# Patient Record
Sex: Female | Born: 1960 | Race: White | Hispanic: No | Marital: Single | Smoking: Former smoker
Health system: Southern US, Community
[De-identification: ages and names within clinical notes are randomized; demographics above are authoritative.]

## PROBLEM LIST (undated history)

## (undated) DIAGNOSIS — K5790 Diverticulosis of intestine, part unspecified, without perforation or abscess without bleeding: Secondary | ICD-10-CM

## (undated) DIAGNOSIS — E079 Disorder of thyroid, unspecified: Secondary | ICD-10-CM

## (undated) DIAGNOSIS — C539 Malignant neoplasm of cervix uteri, unspecified: Secondary | ICD-10-CM

## (undated) DIAGNOSIS — D126 Benign neoplasm of colon, unspecified: Secondary | ICD-10-CM

## (undated) DIAGNOSIS — J320 Chronic maxillary sinusitis: Secondary | ICD-10-CM

## (undated) DIAGNOSIS — K279 Peptic ulcer, site unspecified, unspecified as acute or chronic, without hemorrhage or perforation: Secondary | ICD-10-CM

## (undated) DIAGNOSIS — E890 Postprocedural hypothyroidism: Secondary | ICD-10-CM

## (undated) DIAGNOSIS — R55 Syncope and collapse: Secondary | ICD-10-CM

## (undated) DIAGNOSIS — M1990 Unspecified osteoarthritis, unspecified site: Secondary | ICD-10-CM

## (undated) DIAGNOSIS — R5381 Other malaise: Secondary | ICD-10-CM

## (undated) DIAGNOSIS — N200 Calculus of kidney: Secondary | ICD-10-CM

## (undated) DIAGNOSIS — T7840XA Allergy, unspecified, initial encounter: Secondary | ICD-10-CM

## (undated) DIAGNOSIS — Z972 Presence of dental prosthetic device (complete) (partial): Secondary | ICD-10-CM

## (undated) DIAGNOSIS — E785 Hyperlipidemia, unspecified: Secondary | ICD-10-CM

## (undated) DIAGNOSIS — I10 Essential (primary) hypertension: Secondary | ICD-10-CM

## (undated) DIAGNOSIS — Z8719 Personal history of other diseases of the digestive system: Secondary | ICD-10-CM

## (undated) DIAGNOSIS — M1711 Unilateral primary osteoarthritis, right knee: Secondary | ICD-10-CM

## (undated) DIAGNOSIS — J45909 Unspecified asthma, uncomplicated: Secondary | ICD-10-CM

## (undated) DIAGNOSIS — Z87891 Personal history of nicotine dependence: Secondary | ICD-10-CM

## (undated) DIAGNOSIS — J019 Acute sinusitis, unspecified: Secondary | ICD-10-CM

## (undated) DIAGNOSIS — K219 Gastro-esophageal reflux disease without esophagitis: Secondary | ICD-10-CM

## (undated) DIAGNOSIS — G4700 Insomnia, unspecified: Secondary | ICD-10-CM

## (undated) DIAGNOSIS — Z471 Aftercare following joint replacement surgery: Secondary | ICD-10-CM

## (undated) DIAGNOSIS — R413 Other amnesia: Secondary | ICD-10-CM

## (undated) DIAGNOSIS — J069 Acute upper respiratory infection, unspecified: Secondary | ICD-10-CM

## (undated) DIAGNOSIS — Z96651 Presence of right artificial knee joint: Secondary | ICD-10-CM

## (undated) DIAGNOSIS — J301 Allergic rhinitis due to pollen: Secondary | ICD-10-CM

## (undated) DIAGNOSIS — E039 Hypothyroidism, unspecified: Secondary | ICD-10-CM

## (undated) DIAGNOSIS — L930 Discoid lupus erythematosus: Secondary | ICD-10-CM

## (undated) DIAGNOSIS — Z79890 Hormone replacement therapy (postmenopausal): Secondary | ICD-10-CM

## (undated) DIAGNOSIS — R5383 Other fatigue: Secondary | ICD-10-CM

## (undated) HISTORY — PX: OOPHORECTOMY: SHX86

## (undated) HISTORY — DX: Hyperlipidemia, unspecified: E78.5

## (undated) HISTORY — DX: Malignant neoplasm of cervix uteri, unspecified: C53.9

## (undated) HISTORY — DX: Allergic rhinitis due to pollen: J30.1

## (undated) HISTORY — PX: ABDOMINAL HYSTERECTOMY: SHX81

## (undated) HISTORY — DX: Hormone replacement therapy: Z79.890

## (undated) HISTORY — DX: Benign neoplasm of colon, unspecified: D12.6

## (undated) HISTORY — PX: MOHS SURGERY: SUR867

## (undated) HISTORY — DX: Postprocedural hypothyroidism: E89.0

## (undated) HISTORY — DX: Calculus of kidney: N20.0

## (undated) HISTORY — DX: Other fatigue: R53.83

## (undated) HISTORY — PX: THYROIDECTOMY: SHX17

## (undated) HISTORY — PX: TUBAL LIGATION: SHX77

## (undated) HISTORY — DX: Disorder of thyroid, unspecified: E07.9

## (undated) HISTORY — DX: Unspecified asthma, uncomplicated: J45.909

## (undated) HISTORY — DX: Other malaise: R53.81

## (undated) HISTORY — DX: Diverticulosis of intestine, part unspecified, without perforation or abscess without bleeding: K57.90

## (undated) HISTORY — DX: Insomnia, unspecified: G47.00

## (undated) HISTORY — DX: Aftercare following joint replacement surgery: Z47.1

## (undated) HISTORY — DX: Peptic ulcer, site unspecified, unspecified as acute or chronic, without hemorrhage or perforation: K27.9

## (undated) HISTORY — DX: Chronic maxillary sinusitis: J32.0

## (undated) HISTORY — DX: Other amnesia: R41.3

## (undated) HISTORY — DX: Acute upper respiratory infection, unspecified: J06.9

## (undated) HISTORY — PX: REPLACEMENT TOTAL KNEE: SUR1224

## (undated) HISTORY — DX: Gastro-esophageal reflux disease without esophagitis: K21.9

## (undated) HISTORY — PX: OTHER SURGICAL HISTORY: SHX169

## (undated) HISTORY — DX: Personal history of nicotine dependence: Z87.891

## (undated) HISTORY — DX: Allergy, unspecified, initial encounter: T78.40XA

## (undated) HISTORY — DX: Unilateral primary osteoarthritis, right knee: M17.11

## (undated) HISTORY — PX: COLONOSCOPY: SHX174

## (undated) HISTORY — PX: ELBOW SURGERY: SHX618

## (undated) HISTORY — DX: Hypothyroidism, unspecified: E03.9

## (undated) HISTORY — PX: CHOLECYSTECTOMY: SHX55

## (undated) HISTORY — PX: RECTOCELE REPAIR: SHX761

## (undated) HISTORY — DX: Essential (primary) hypertension: I10

## (undated) HISTORY — DX: Acute sinusitis, unspecified: J01.90

## (undated) HISTORY — DX: Presence of right artificial knee joint: Z96.651

---

## 1997-03-17 ENCOUNTER — Ambulatory Visit (HOSPITAL_COMMUNITY): Admission: RE | Admit: 1997-03-17 | Discharge: 1997-03-17 | Payer: Self-pay | Admitting: *Deleted

## 1997-03-23 ENCOUNTER — Ambulatory Visit (HOSPITAL_COMMUNITY): Admission: RE | Admit: 1997-03-23 | Discharge: 1997-03-23 | Payer: Self-pay | Admitting: *Deleted

## 1998-04-23 ENCOUNTER — Ambulatory Visit (HOSPITAL_COMMUNITY): Admission: RE | Admit: 1998-04-23 | Discharge: 1998-04-23 | Payer: Self-pay | Admitting: *Deleted

## 1998-04-26 ENCOUNTER — Other Ambulatory Visit: Admission: RE | Admit: 1998-04-26 | Discharge: 1998-04-26 | Payer: Self-pay | Admitting: *Deleted

## 1999-04-26 ENCOUNTER — Encounter: Payer: Self-pay | Admitting: *Deleted

## 1999-04-26 ENCOUNTER — Encounter: Admission: RE | Admit: 1999-04-26 | Discharge: 1999-04-26 | Payer: Self-pay | Admitting: *Deleted

## 1999-06-06 ENCOUNTER — Other Ambulatory Visit: Admission: RE | Admit: 1999-06-06 | Discharge: 1999-06-06 | Payer: Self-pay | Admitting: *Deleted

## 2000-03-31 ENCOUNTER — Encounter: Payer: Self-pay | Admitting: *Deleted

## 2000-03-31 ENCOUNTER — Encounter: Admission: RE | Admit: 2000-03-31 | Discharge: 2000-03-31 | Payer: Self-pay | Admitting: *Deleted

## 2000-08-10 ENCOUNTER — Other Ambulatory Visit: Admission: RE | Admit: 2000-08-10 | Discharge: 2000-08-10 | Payer: Self-pay | Admitting: *Deleted

## 2001-04-16 ENCOUNTER — Encounter: Payer: Self-pay | Admitting: *Deleted

## 2001-04-16 ENCOUNTER — Encounter: Admission: RE | Admit: 2001-04-16 | Discharge: 2001-04-16 | Payer: Self-pay | Admitting: *Deleted

## 2001-07-20 ENCOUNTER — Other Ambulatory Visit: Admission: RE | Admit: 2001-07-20 | Discharge: 2001-07-20 | Payer: Self-pay | Admitting: *Deleted

## 2002-05-11 ENCOUNTER — Encounter: Admission: RE | Admit: 2002-05-11 | Discharge: 2002-05-11 | Payer: Self-pay | Admitting: *Deleted

## 2002-05-11 ENCOUNTER — Encounter: Payer: Self-pay | Admitting: *Deleted

## 2002-08-09 ENCOUNTER — Other Ambulatory Visit: Admission: RE | Admit: 2002-08-09 | Discharge: 2002-08-09 | Payer: Self-pay | Admitting: *Deleted

## 2002-09-27 ENCOUNTER — Encounter: Admission: RE | Admit: 2002-09-27 | Discharge: 2002-09-27 | Payer: Self-pay | Admitting: Orthopedic Surgery

## 2002-09-27 ENCOUNTER — Encounter: Payer: Self-pay | Admitting: Orthopedic Surgery

## 2003-05-12 ENCOUNTER — Encounter: Admission: RE | Admit: 2003-05-12 | Discharge: 2003-05-12 | Payer: Self-pay | Admitting: *Deleted

## 2003-08-09 ENCOUNTER — Other Ambulatory Visit: Admission: RE | Admit: 2003-08-09 | Discharge: 2003-08-09 | Payer: Self-pay | Admitting: *Deleted

## 2003-09-27 ENCOUNTER — Other Ambulatory Visit: Admission: RE | Admit: 2003-09-27 | Discharge: 2003-09-27 | Payer: Self-pay | Admitting: *Deleted

## 2003-10-06 ENCOUNTER — Encounter: Admission: RE | Admit: 2003-10-06 | Discharge: 2003-10-06 | Payer: Self-pay | Admitting: *Deleted

## 2004-04-16 ENCOUNTER — Encounter: Admission: RE | Admit: 2004-04-16 | Discharge: 2004-04-16 | Payer: Self-pay | Admitting: *Deleted

## 2004-08-20 ENCOUNTER — Other Ambulatory Visit: Admission: RE | Admit: 2004-08-20 | Discharge: 2004-08-20 | Payer: Self-pay | Admitting: *Deleted

## 2004-11-26 ENCOUNTER — Encounter: Admission: RE | Admit: 2004-11-26 | Discharge: 2004-11-26 | Payer: Self-pay | Admitting: *Deleted

## 2005-06-12 ENCOUNTER — Encounter: Admission: RE | Admit: 2005-06-12 | Discharge: 2005-06-12 | Payer: Self-pay | Admitting: Family Medicine

## 2005-08-28 ENCOUNTER — Other Ambulatory Visit: Admission: RE | Admit: 2005-08-28 | Discharge: 2005-08-28 | Payer: Self-pay | Admitting: *Deleted

## 2005-12-25 ENCOUNTER — Encounter: Admission: RE | Admit: 2005-12-25 | Discharge: 2005-12-25 | Payer: Self-pay | Admitting: *Deleted

## 2006-01-12 ENCOUNTER — Encounter: Payer: Self-pay | Admitting: Vascular Surgery

## 2006-01-12 ENCOUNTER — Ambulatory Visit (HOSPITAL_COMMUNITY): Admission: RE | Admit: 2006-01-12 | Discharge: 2006-01-12 | Payer: Self-pay | Admitting: Orthopedic Surgery

## 2006-03-25 ENCOUNTER — Ambulatory Visit: Payer: Self-pay | Admitting: Family Medicine

## 2006-06-25 ENCOUNTER — Telehealth (INDEPENDENT_AMBULATORY_CARE_PROVIDER_SITE_OTHER): Payer: Self-pay | Admitting: *Deleted

## 2006-06-26 ENCOUNTER — Ambulatory Visit: Payer: Self-pay | Admitting: Internal Medicine

## 2006-06-26 DIAGNOSIS — G4700 Insomnia, unspecified: Secondary | ICD-10-CM

## 2006-06-26 DIAGNOSIS — E039 Hypothyroidism, unspecified: Secondary | ICD-10-CM | POA: Insufficient documentation

## 2006-06-26 DIAGNOSIS — K219 Gastro-esophageal reflux disease without esophagitis: Secondary | ICD-10-CM

## 2006-06-26 DIAGNOSIS — J301 Allergic rhinitis due to pollen: Secondary | ICD-10-CM | POA: Insufficient documentation

## 2006-06-26 DIAGNOSIS — J320 Chronic maxillary sinusitis: Secondary | ICD-10-CM

## 2006-06-26 HISTORY — DX: Allergic rhinitis due to pollen: J30.1

## 2006-06-26 HISTORY — DX: Chronic maxillary sinusitis: J32.0

## 2006-06-26 HISTORY — DX: Insomnia, unspecified: G47.00

## 2006-06-26 HISTORY — DX: Hypothyroidism, unspecified: E03.9

## 2006-06-26 HISTORY — DX: Gastro-esophageal reflux disease without esophagitis: K21.9

## 2006-09-15 ENCOUNTER — Other Ambulatory Visit: Admission: RE | Admit: 2006-09-15 | Discharge: 2006-09-15 | Payer: Self-pay | Admitting: *Deleted

## 2006-12-28 ENCOUNTER — Encounter: Admission: RE | Admit: 2006-12-28 | Discharge: 2006-12-28 | Payer: Self-pay | Admitting: *Deleted

## 2006-12-29 ENCOUNTER — Ambulatory Visit: Payer: Self-pay | Admitting: Internal Medicine

## 2006-12-29 ENCOUNTER — Telehealth (INDEPENDENT_AMBULATORY_CARE_PROVIDER_SITE_OTHER): Payer: Self-pay | Admitting: *Deleted

## 2006-12-29 DIAGNOSIS — J019 Acute sinusitis, unspecified: Secondary | ICD-10-CM

## 2006-12-29 HISTORY — DX: Acute sinusitis, unspecified: J01.90

## 2007-02-16 ENCOUNTER — Telehealth (INDEPENDENT_AMBULATORY_CARE_PROVIDER_SITE_OTHER): Payer: Self-pay | Admitting: Family Medicine

## 2007-02-19 ENCOUNTER — Encounter (INDEPENDENT_AMBULATORY_CARE_PROVIDER_SITE_OTHER): Payer: Self-pay | Admitting: Family Medicine

## 2007-03-15 ENCOUNTER — Encounter (INDEPENDENT_AMBULATORY_CARE_PROVIDER_SITE_OTHER): Payer: Self-pay | Admitting: Family Medicine

## 2007-03-29 ENCOUNTER — Telehealth (INDEPENDENT_AMBULATORY_CARE_PROVIDER_SITE_OTHER): Payer: Self-pay | Admitting: *Deleted

## 2007-08-23 ENCOUNTER — Encounter (INDEPENDENT_AMBULATORY_CARE_PROVIDER_SITE_OTHER): Payer: Self-pay | Admitting: *Deleted

## 2007-08-23 ENCOUNTER — Telehealth: Payer: Self-pay | Admitting: Family Medicine

## 2007-08-23 ENCOUNTER — Ambulatory Visit: Payer: Self-pay | Admitting: Family Medicine

## 2007-08-23 DIAGNOSIS — J069 Acute upper respiratory infection, unspecified: Secondary | ICD-10-CM | POA: Insufficient documentation

## 2007-08-23 DIAGNOSIS — R5381 Other malaise: Secondary | ICD-10-CM | POA: Insufficient documentation

## 2007-08-23 DIAGNOSIS — R5383 Other fatigue: Secondary | ICD-10-CM

## 2007-08-23 HISTORY — DX: Acute upper respiratory infection, unspecified: J06.9

## 2007-08-23 HISTORY — DX: Other malaise: R53.81

## 2007-08-24 ENCOUNTER — Encounter (INDEPENDENT_AMBULATORY_CARE_PROVIDER_SITE_OTHER): Payer: Self-pay | Admitting: *Deleted

## 2007-09-01 ENCOUNTER — Encounter (INDEPENDENT_AMBULATORY_CARE_PROVIDER_SITE_OTHER): Payer: Self-pay | Admitting: *Deleted

## 2007-09-01 LAB — CONVERTED CEMR LAB
BUN: 7 mg/dL (ref 6–23)
Basophils Absolute: 0 10*3/uL (ref 0.0–0.1)
Basophils Relative: 0.4 % (ref 0.0–3.0)
Calcium: 9.4 mg/dL (ref 8.4–10.5)
Creatinine, Ser: 0.7 mg/dL (ref 0.4–1.2)
Eosinophils Absolute: 0.1 10*3/uL (ref 0.0–0.7)
GFR calc Af Amer: 115 mL/min
GFR calc non Af Amer: 95 mL/min
Glucose, Bld: 82 mg/dL (ref 70–99)
Hemoglobin: 14.9 g/dL (ref 12.0–15.0)
Lymphocytes Relative: 30.3 % (ref 12.0–46.0)
MCHC: 35.3 g/dL (ref 30.0–36.0)
MCV: 93 fL (ref 78.0–100.0)
Monocytes Absolute: 0.7 10*3/uL (ref 0.1–1.0)
Neutro Abs: 4.1 10*3/uL (ref 1.4–7.7)
RBC: 4.55 M/uL (ref 3.87–5.11)
RDW: 12.4 % (ref 11.5–14.6)
Vitamin B-12: 1035 pg/mL — ABNORMAL HIGH (ref 211–911)

## 2008-01-20 ENCOUNTER — Encounter: Admission: RE | Admit: 2008-01-20 | Discharge: 2008-01-20 | Payer: Self-pay | Admitting: Obstetrics and Gynecology

## 2008-01-25 ENCOUNTER — Ambulatory Visit: Payer: Self-pay | Admitting: Family Medicine

## 2008-02-01 ENCOUNTER — Telehealth: Payer: Self-pay | Admitting: Family Medicine

## 2010-02-10 ENCOUNTER — Encounter: Payer: Self-pay | Admitting: Obstetrics and Gynecology

## 2011-02-21 HISTORY — PX: ESOPHAGOGASTRODUODENOSCOPY: SHX1529

## 2011-04-12 ENCOUNTER — Emergency Department (HOSPITAL_COMMUNITY)
Admission: EM | Admit: 2011-04-12 | Discharge: 2011-04-12 | Discharge status: Against Medical Advice | Payer: Self-pay | Attending: Emergency Medicine | Admitting: Emergency Medicine

## 2011-04-12 NOTE — ED Notes (Signed)
[id number]<AV> Expected date:<BR> Expected time: 5:40 PM<BR> Means of arrival:Ambulance<BR> Comments:<BR> M12 -- Nausea

## 2011-04-12 NOTE — ED Notes (Signed)
Upon nurse's entrance into the room, pt was adamant on leaving.  Pt would not allow nurse to assess her.  IV was removed.  AMA papers signed.  Pt left.

## 2011-04-21 MED FILL — Ondansetron HCl Inj 4 MG/2ML (2 MG/ML): INTRAMUSCULAR | Qty: 2 | Status: AC

## 2012-05-23 ENCOUNTER — Emergency Department (HOSPITAL_COMMUNITY)
Admission: EM | Admit: 2012-05-23 | Discharge: 2012-05-23 | Disposition: A | Discharge status: Home / Self Care | Payer: No Typology Code available for payment source | Attending: Emergency Medicine | Admitting: Emergency Medicine

## 2012-05-23 ENCOUNTER — Encounter (HOSPITAL_COMMUNITY): Payer: Self-pay | Admitting: *Deleted

## 2012-05-23 ENCOUNTER — Emergency Department (HOSPITAL_COMMUNITY): Payer: No Typology Code available for payment source

## 2012-05-23 DIAGNOSIS — Y9389 Activity, other specified: Secondary | ICD-10-CM | POA: Insufficient documentation

## 2012-05-23 DIAGNOSIS — S6990XA Unspecified injury of unspecified wrist, hand and finger(s), initial encounter: Secondary | ICD-10-CM | POA: Insufficient documentation

## 2012-05-23 DIAGNOSIS — S8010XA Contusion of unspecified lower leg, initial encounter: Secondary | ICD-10-CM | POA: Insufficient documentation

## 2012-05-23 DIAGNOSIS — IMO0002 Reserved for concepts with insufficient information to code with codable children: Secondary | ICD-10-CM | POA: Insufficient documentation

## 2012-05-23 DIAGNOSIS — S8012XA Contusion of left lower leg, initial encounter: Secondary | ICD-10-CM

## 2012-05-23 DIAGNOSIS — K219 Gastro-esophageal reflux disease without esophagitis: Secondary | ICD-10-CM | POA: Insufficient documentation

## 2012-05-23 DIAGNOSIS — M79632 Pain in left forearm: Secondary | ICD-10-CM

## 2012-05-23 DIAGNOSIS — Y92410 Unspecified street and highway as the place of occurrence of the external cause: Secondary | ICD-10-CM | POA: Insufficient documentation

## 2012-05-23 DIAGNOSIS — S59909A Unspecified injury of unspecified elbow, initial encounter: Secondary | ICD-10-CM | POA: Insufficient documentation

## 2012-05-23 DIAGNOSIS — Z8739 Personal history of other diseases of the musculoskeletal system and connective tissue: Secondary | ICD-10-CM | POA: Insufficient documentation

## 2012-05-23 HISTORY — DX: Gastro-esophageal reflux disease without esophagitis: K21.9

## 2012-05-23 HISTORY — DX: Discoid lupus erythematosus: L93.0

## 2012-05-23 MED ORDER — OXYCODONE-ACETAMINOPHEN 5-325 MG PO TABS: 1.0000 | ORAL_TABLET | ORAL | Status: DC | PRN

## 2012-05-23 NOTE — ED Provider Notes (Signed)
Medical screening examination/treatment/procedure(s) were performed by non-physician practitioner and as supervising physician I was immediately available for consultation/collaboration.   Jozlin Bently H Strother Everitt, MD 05/23/12 2327 
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
## 2012-06-15 ENCOUNTER — Emergency Department (HOSPITAL_BASED_OUTPATIENT_CLINIC_OR_DEPARTMENT_OTHER)
Admission: EM | Admit: 2012-06-15 | Discharge: 2012-06-15 | Disposition: A | Discharge status: Home / Self Care | Payer: No Typology Code available for payment source | Attending: Emergency Medicine | Admitting: Emergency Medicine

## 2012-06-15 ENCOUNTER — Emergency Department (HOSPITAL_BASED_OUTPATIENT_CLINIC_OR_DEPARTMENT_OTHER): Payer: No Typology Code available for payment source

## 2012-06-15 ENCOUNTER — Encounter (HOSPITAL_BASED_OUTPATIENT_CLINIC_OR_DEPARTMENT_OTHER): Payer: Self-pay | Admitting: *Deleted

## 2012-06-15 DIAGNOSIS — Y929 Unspecified place or not applicable: Secondary | ICD-10-CM | POA: Insufficient documentation

## 2012-06-15 DIAGNOSIS — Z87891 Personal history of nicotine dependence: Secondary | ICD-10-CM | POA: Insufficient documentation

## 2012-06-15 DIAGNOSIS — K219 Gastro-esophageal reflux disease without esophagitis: Secondary | ICD-10-CM | POA: Insufficient documentation

## 2012-06-15 DIAGNOSIS — S139XXA Sprain of joints and ligaments of unspecified parts of neck, initial encounter: Secondary | ICD-10-CM | POA: Insufficient documentation

## 2012-06-15 DIAGNOSIS — Z791 Long term (current) use of non-steroidal anti-inflammatories (NSAID): Secondary | ICD-10-CM | POA: Insufficient documentation

## 2012-06-15 DIAGNOSIS — L930 Discoid lupus erythematosus: Secondary | ICD-10-CM | POA: Insufficient documentation

## 2012-06-15 DIAGNOSIS — Z79899 Other long term (current) drug therapy: Secondary | ICD-10-CM | POA: Insufficient documentation

## 2012-06-15 DIAGNOSIS — Y9389 Activity, other specified: Secondary | ICD-10-CM | POA: Insufficient documentation

## 2012-06-15 DIAGNOSIS — S161XXD Strain of muscle, fascia and tendon at neck level, subsequent encounter: Secondary | ICD-10-CM

## 2012-06-15 MED ORDER — METHOCARBAMOL 500 MG PO TABS: 500.0000 mg | ORAL_TABLET | Freq: Two times a day (BID) | ORAL | Status: DC

## 2012-06-15 MED ORDER — KETOROLAC TROMETHAMINE 60 MG/2ML IM SOLN
60.0000 mg | Freq: Once | INTRAMUSCULAR | Status: AC
  Administered 2012-06-15: 60 mg via INTRAMUSCULAR
  Filled 2012-06-15: qty 2

## 2012-06-15 MED ORDER — NAPROXEN 500 MG PO TABS: 500.0000 mg | ORAL_TABLET | Freq: Two times a day (BID) | ORAL | Status: DC
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
## 2012-06-15 NOTE — ED Notes (Signed)
Encouraged Pt. To call her gastro. Dr. Before she gets scripts filled.
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
## 2012-11-29 ENCOUNTER — Encounter: Payer: Self-pay | Admitting: Internal Medicine

## 2012-11-29 DIAGNOSIS — E079 Disorder of thyroid, unspecified: Secondary | ICD-10-CM | POA: Insufficient documentation

## 2012-11-29 DIAGNOSIS — E785 Hyperlipidemia, unspecified: Secondary | ICD-10-CM

## 2012-11-29 DIAGNOSIS — L930 Discoid lupus erythematosus: Secondary | ICD-10-CM | POA: Insufficient documentation

## 2012-11-29 DIAGNOSIS — K219 Gastro-esophageal reflux disease without esophagitis: Secondary | ICD-10-CM | POA: Insufficient documentation

## 2012-11-30 ENCOUNTER — Encounter: Payer: Self-pay | Admitting: Emergency Medicine

## 2012-11-30 ENCOUNTER — Ambulatory Visit: Payer: 59 | Admitting: Emergency Medicine

## 2012-11-30 VITALS — BP 112/80 | HR 80 | Temp 98.0°F | Resp 18 | Ht 61.0 in | Wt 140.0 lb

## 2012-11-30 DIAGNOSIS — M79642 Pain in left hand: Secondary | ICD-10-CM

## 2012-11-30 DIAGNOSIS — R5381 Other malaise: Secondary | ICD-10-CM

## 2012-11-30 LAB — CBC WITH DIFFERENTIAL/PLATELET
Basophils Absolute: 0 10*3/uL (ref 0.0–0.1)
Eosinophils Absolute: 0.1 10*3/uL (ref 0.0–0.7)
Eosinophils Relative: 1 % (ref 0–5)
Lymphocytes Relative: 29 % (ref 12–46)
Lymphs Abs: 2.2 10*3/uL (ref 0.7–4.0)
MCH: 32 pg (ref 26.0–34.0)
MCV: 91.8 fL (ref 78.0–100.0)
Monocytes Absolute: 0.5 10*3/uL (ref 0.1–1.0)
Neutro Abs: 4.8 10*3/uL (ref 1.7–7.7)
Neutrophils Relative %: 63 % (ref 43–77)
Platelets: 358 10*3/uL (ref 150–400)
RBC: 4.41 MIL/uL (ref 3.87–5.11)
RDW: 15.3 % (ref 11.5–15.5)
WBC: 7.6 10*3/uL (ref 4.0–10.5)

## 2012-11-30 MED ORDER — CELECOXIB 100 MG PO CAPS: 100.0000 mg | ORAL_CAPSULE | Freq: Every day | ORAL | Status: DC

## 2012-11-30 NOTE — Patient Instructions (Signed)
Lupus Lupus (also called systemic lupus erythematosus, SLE) is a disorder of the body's natural defense system (immune system). In lupus, the immune system attacks various areas of the body (autoimmune disease). CAUSES The cause is unknown. However, lupus runs in families. Certain genes can make you more likely to develop lupus. It is 10 times more common in women than in men. Lupus is also more common in African Americans and Asians. Other factors also play a role, such as viruses (Epstein-Barr virus, EBV), stress, hormones, cigarette smoke, and certain drugs. SYMPTOMS Lupus can affect many parts of the body, including the joints, skin, kidneys, lungs, heart, nervous system, and blood vessels. The signs and symptoms of lupus differ from person to person. The disease can range from mild to life-threatening. Typical features of lupus include:  Butterfly-shaped rash over the face.  Arthritis involving one or more joints.  Kidney disease.  Fever, weight loss, hair loss, fatigue.  Poor circulation in the fingers and toes (Raynaud's disease).  Chest pain when taking deep breaths. Abdominal pain may also occur.  Skin rash in areas exposed to the sun.  Sores in the mouth and nose. DIAGNOSIS Diagnosing lupus can take a long time and is often difficult. An exam and an accurate account of your symptoms and health problems is very important. Blood tests are necessary, though no single test can confirm or rule out lupus. Most people with lupus test positive for antinuclear antibodies (ANA) on a blood test. Additional blood tests, a urine test (urinalysis), and sometimes a kidney or skin tissue sample (biopsy) can help to confirm or rule out lupus. TREATMENT There is no cure for lupus. Your caregiver will develop a treatment plan based on your age, sex, health, symptoms, and lifestyle. The goals are to prevent flares, to treat them when they do occur, and to minimize organ damage and complications. How  the disease may affect each person varies widely. Most people with lupus can live normal lives, but this disorder must be carefully monitored. Treatment must be adjusted as necessary to prevent serious complications. Medicines used for treatment:  Nonsteroidal anti-inflammatory drugs (NSAIDs) decrease inflammation and can help with chest pain, joint pain, and fevers. Examples include ibuprofen and naproxen.  Antimalarial drugs were designed to treat malaria. They also treat fatigue, joint pain, skin rashes, and inflammation of the lungs in patients with lupus.  Corticosteroids are powerful hormones that rapidly suppress inflammation. The lowest dose with the highest benefit will be chosen. They can be given by cream, pills, injections, and through the vein (intravenously).  Immunosuppressive drugs block the making of immune cells. They may be used for kidney or nerve disease. HOME CARE INSTRUCTIONS  Exercise. Low-impact activities can usually help keep joints flexible without being too strenuous.  Rest after periods of exercise.  Avoid excessive sun exposure.  Follow proper nutrition and take supplements as recommended by your caregiver.  Stress management can be helpful. SEEK MEDICAL CARE IF:  You have increased fatigue.  You develop pain.  You develop a rash.  You have an oral temperature above 102 F (38.9 C).  You develop abdominal discomfort.  You develop a headache.  You experience dizziness. FOR MORE INFORMATION National Institute of Neurological Disorders and Stroke: [employer] of Rheumatology: www.rheumatology.[place] of Arthritis and Musculoskeletal and Skin Diseases: www.niams.http://www.myers.net/ Document Released: 12/27/2001 Document Revised: 03/31/2011 Document Reviewed: 04/19/2009 [facility] Patient Information 2014 [place], [place]. Hypoglycemia (Low Blood Sugar) Hypoglycemia is when the glucose (sugar) in your blood is  too low.  Hypoglycemia can happen for many reasons. It can happen to people with or without diabetes. Hypoglycemia can develop quickly and can be a medical emergency.  CAUSES  Having hypoglycemia does not mean that you will develop diabetes. Different causes include:  Missed or delayed meals or not enough carbohydrates eaten.  Medication overdose. This could be by accident or deliberate. If by accident, your medication may need to be adjusted or changed.  Exercise or increased activity without adjustments in carbohydrates or medications.  A nerve disorder that affects body functions like your heart rate, blood pressure and digestion (autonomic neuropathy).  A condition where the stomach muscles do not function properly (gastroparesis). Therefore, medications may not absorb properly.  The inability to recognize the signs of hypoglycemia (hypoglycemic unawareness).  Absorption of insulin  may be altered.  Alcohol consumption.  Pregnancy/menstrual cycles/postpartum. This may be due to hormones.  Certain kinds of tumors. This is very rare. SYMPTOMS   Sweating.  Hunger.  Dizziness.  Blurred vision.  Drowsiness.  Weakness.  Headache.  Rapid heart beat.  Shakiness.  Nervousness. DIAGNOSIS  Diagnosis is made by monitoring blood glucose in one or all of the following ways:  Fingerstick blood glucose monitoring.  Laboratory results. TREATMENT  If you think your blood glucose is low:  Check your blood glucose, if possible. If it is less than 70 mg/dl, take one of the following:  3-4 glucose tablets.   cup juice (prefer clear like apple).   cup "regular" soda pop.  1 cup milk.  -1 tube of glucose gel.  5-6 hard candies.  Do not over treat because your blood glucose (sugar) will only go too high.  Wait 15 minutes and recheck your blood glucose. If it is still less than 70 mg/dl (or below your target range), repeat treatment.  Eat a snack if it is more than one hour  until your next meal. Sometimes, your blood glucose may go so low that you are unable to treat yourself. You may need someone to help you. You may even pass out or be unable to swallow. This may require you to get an injection of glucagon, which raises the blood glucose. HOME CARE INSTRUCTIONS  Check blood glucose as recommended by your caregiver.  Take medication as prescribed by your caregiver.  Follow your meal plan. Do not skip meals. Eat on time.  If you are going to drink alcohol, drink it only with meals.  Check your blood glucose before driving.  Check your blood glucose before and after exercise. If you exercise longer or different than usual, be sure to check blood glucose more frequently.  Always carry treatment with you. Glucose tablets are the easiest to carry.  Always wear medical alert jewelry or carry some form of identification that states that you have diabetes. This will alert people that you have diabetes. If you have hypoglycemia, they will have a better idea on what to do. SEEK MEDICAL CARE IF:   You are having problems keeping your blood sugar at target range.  You are having frequent episodes of hypoglycemia.  You feel you might be having side effects from your medicines.  You have symptoms of an illness that is not improving after 3-4 days.  You notice a change in vision or a new problem with your vision. SEEK IMMEDIATE MEDICAL CARE IF:   You are a family member or friend of a person whose blood glucose goes below 70 mg/dl and is accompanied by:  Confusion.  A change in mental status.  The inability to swallow.  Passing out. Document Released: 01/06/2005 Document Revised: 03/31/2011 Document Reviewed: 05/05/2011 [facility] Patient Information 2014 [place], [place]. Fatigue Fatigue is a feeling of tiredness, lack of energy, lack of motivation, or feeling tired all the time. Having enough rest, good nutrition, and reducing stress will normally reduce  fatigue. Consult your caregiver if it persists. The nature of your fatigue will help your caregiver to find out its cause. The treatment is based on the cause.  CAUSES  There are many causes for fatigue. Most of the time, fatigue can be traced to one or more of your habits or routines. Most causes fit into one or more of three general areas. They are: Lifestyle problems  Sleep disturbances.  Overwork.  Physical exertion.  Unhealthy habits.  Poor eating habits or eating disorders.  Alcohol and/or drug use .  Lack of proper nutrition (malnutrition). Psychological problems  Stress and/or anxiety problems.  Depression.  Grief.  Boredom. Medical Problems or Conditions  Anemia.  Pregnancy.  Thyroid gland problems.  Recovery from major surgery.  Continuous pain.  Emphysema or asthma that is not well controlled  Allergic conditions.  Diabetes.  Infections (such as mononucleosis).  Obesity.  Sleep disorders, such as sleep apnea.  Heart failure or other heart-related problems.  Cancer.  Kidney disease.  Liver disease.  Effects of certain medicines such as antihistamines, cough and cold remedies, prescription pain medicines, heart and blood pressure medicines, drugs used for treatment of cancer, and some antidepressants. SYMPTOMS  The symptoms of fatigue include:   Lack of energy.  Lack of drive (motivation).  Drowsiness.  Feeling of indifference to the surroundings. DIAGNOSIS  The details of how you feel help guide your caregiver in finding out what is causing the fatigue. You will be asked about your present and past health condition. It is important to review all medicines that you take, including prescription and non-prescription items. A thorough exam will be done. You will be questioned about your feelings, habits, and normal lifestyle. Your caregiver may suggest blood tests, urine tests, or other tests to look for common medical causes of fatigue.   TREATMENT  Fatigue is treated by correcting the underlying cause. For example, if you have continuous pain or depression, treating these causes will improve how you feel. Similarly, adjusting the dose of certain medicines will help in reducing fatigue.  HOME CARE INSTRUCTIONS   Try to get the required amount of good sleep every night.  Eat a healthy and nutritious diet, and drink enough water throughout the day.  Practice ways of relaxing (including yoga or meditation).  Exercise regularly.  Make plans to change situations that cause stress. Act on those plans so that stresses decrease over time. Keep your work and personal routine reasonable.  Avoid street drugs and minimize use of alcohol.  Start taking a daily multivitamin after consulting your caregiver. SEEK MEDICAL CARE IF:   You have persistent tiredness, which cannot be accounted for.  You have fever.  You have unintentional weight loss.  You have headaches.  You have disturbed sleep throughout the night.  You are feeling sad.  You have constipation.  You have dry skin.  You have gained weight.  You are taking any new or different medicines that you suspect are causing fatigue.  You are unable to sleep at night.  You develop any unusual swelling of your legs or other parts of your body. SEEK IMMEDIATE MEDICAL CARE  IF:   You are feeling confused.  Your vision is blurred.  You feel faint or pass out.  You develop severe headache.  You develop severe abdominal, pelvic, or back pain.  You develop chest pain, shortness of breath, or an irregular or fast heartbeat.  You are unable to pass a normal amount of urine.  You develop abnormal bleeding such as bleeding from the rectum or you vomit blood.  You have thoughts about harming yourself or committing suicide.  You are worried that you might harm someone else. MAKE SURE YOU:   Understand these instructions.  Will watch your condition.  Will get  help right away if you are not doing well or get worse. Document Released: 11/03/2006 Document Revised: 03/31/2011 Document Reviewed: 11/03/2006 [facility] Patient Information 2014 [place], [place].

## 2012-11-30 NOTE — Progress Notes (Signed)
Subjective:    Patient ID: Jasmine Hahn, female    DOB: 08/21/1960, 52 y.o.   MRN: [id number]  HPI Comments: 52 yo female with multiple complaints of most importance is weakness. Patient notes feel tired but admits to skipping meals, not eating healthy or exercising regularly. She also notes her left hip and wrist have been hurting with occasional swelling with out trauma HX.  She notes Celebrex has helped in the past for similar pain issues, she denies Rheum workup but has + lupus diagnosis from derm. She has been having more ear fullness with occasional pain x several weeks without relief with sudafed.  Loss of Consciousness Associated symptoms include light-headedness.    Current Outpatient Prescriptions on File Prior to Visit  Medication Sig Dispense Refill  . BIOTIN PO Take 1 tablet by mouth daily.      . cetirizine (ZYRTEC) 10 MG tablet Take 10 mg by mouth daily.      . Cyanocobalamin (VITAMIN B-12 PO) Take 1 tablet by mouth daily.      Marland Kitchen levothyroxine (SYNTHROID, LEVOTHROID) 112 MCG tablet Take 112 mcg by mouth daily before breakfast.      . Omega-3 Fatty Acids (FISH OIL PO) Take 1 capsule by mouth daily.      Marland Kitchen omeprazole (PRILOSEC) 40 MG capsule Take 40 mg by mouth daily.      . phentermine 37.5 MG capsule Take 18.75 mg by mouth every morning. Takes 1/2 tab daily=18.75mg        No current facility-administered medications on file prior to visit.    Review of patient's allergies indicates no known allergies.  Past Medical History  Diagnosis Date  . GERD (gastroesophageal reflux disease)   . Discoid lupus   . Thyroid disease     hypo  . Hyperlipidemia      Review of Systems  Constitutional: Positive for fatigue.  HENT: Positive for ear pain and postnasal drip.   Eyes: Negative.   Respiratory: Negative.   Cardiovascular: Positive for syncope.  Gastrointestinal: Negative.   Neurological: Positive for light-headedness.  Hematological: Negative.    Psychiatric/Behavioral: Negative.   All other systems reviewed and are negative.       Objective:   Physical Exam  Nursing note and vitals reviewed. Constitutional: She is oriented to person, place, and time. She appears well-developed and well-nourished.  HENT:  Head: Normocephalic and atraumatic.  Right Ear: External ear normal.  Left Ear: External ear normal.  Nose: Nose normal.  Mouth/Throat: Oropharynx is clear and moist.  Yellow TM's  Eyes: Pupils are equal, round, and reactive to light.  Neck: Normal range of motion.  Cardiovascular: Normal rate, regular rhythm, normal heart sounds and intact distal pulses.   Pulmonary/Chest: Effort normal and breath sounds normal.  Abdominal: Soft. Bowel sounds are normal.  Musculoskeletal: She exhibits edema and tenderness.  Pain with change of positions left hip, left wrist. + edema left wrist.  Lymphadenopathy:    She has no cervical adenopathy.  Neurological: She is alert and oriented to person, place, and time.  Skin: Skin is warm and dry.  Right cheek 1 cm scaling, pt w/c derm for eval  Psychiatric: She has a normal mood and affect. Her behavior is normal. Judgment and thought content normal.          Assessment & Plan:  Weakness vs hypoglycemia vs autoimmune. Check labs. Allergic rhinitis- start Nasacort AD, mucinex AD, Continue Allegra and increase H2O. Advised to eat q 2hours increase protein. Pt w/c for  derm eval of right cheek. Stretches explained for lumbar discomfort.

## 2012-12-01 LAB — VITAMIN B12: Vitamin B-12: >2000 pg/mL — ABNORMAL HIGH (ref 211–911)

## 2012-12-01 LAB — IRON AND TIBC
TIBC: 285 ug/dL (ref 250–470)
UIBC: 238 ug/dL (ref 125–400)

## 2012-12-01 LAB — INSULIN, FASTING: Insulin fasting, serum: 14 u[IU]/mL (ref 3–28)

## 2012-12-01 LAB — CYCLIC CITRUL PEPTIDE ANTIBODY, IGG: Cyclic Citrullin Peptide Ab: <2 U/mL (ref 0.0–5.0)

## 2012-12-01 LAB — FOLATE RBC: RBC Folate: 778 ng/mL (ref >280)

## 2012-12-10 ENCOUNTER — Ambulatory Visit (INDEPENDENT_AMBULATORY_CARE_PROVIDER_SITE_OTHER): Payer: 59 | Admitting: Emergency Medicine

## 2012-12-10 ENCOUNTER — Emergency Department (HOSPITAL_COMMUNITY): Payer: 59

## 2012-12-10 ENCOUNTER — Encounter (HOSPITAL_COMMUNITY): Payer: Self-pay | Admitting: Emergency Medicine

## 2012-12-10 ENCOUNTER — Emergency Department (HOSPITAL_COMMUNITY)
Admission: EM | Admit: 2012-12-10 | Discharge: 2012-12-10 | Disposition: A | Discharge status: Home / Self Care | Payer: 59 | Attending: Emergency Medicine | Admitting: Emergency Medicine

## 2012-12-10 VITALS — BP 110/68 | HR 90 | Temp 97.9°F | Resp 18 | Ht 61.0 in | Wt 142.4 lb

## 2012-12-10 DIAGNOSIS — Z79899 Other long term (current) drug therapy: Secondary | ICD-10-CM | POA: Insufficient documentation

## 2012-12-10 DIAGNOSIS — K219 Gastro-esophageal reflux disease without esophagitis: Secondary | ICD-10-CM | POA: Insufficient documentation

## 2012-12-10 DIAGNOSIS — R079 Chest pain, unspecified: Secondary | ICD-10-CM

## 2012-12-10 DIAGNOSIS — J329 Chronic sinusitis, unspecified: Secondary | ICD-10-CM | POA: Insufficient documentation

## 2012-12-10 DIAGNOSIS — Z87891 Personal history of nicotine dependence: Secondary | ICD-10-CM | POA: Insufficient documentation

## 2012-12-10 DIAGNOSIS — E079 Disorder of thyroid, unspecified: Secondary | ICD-10-CM | POA: Insufficient documentation

## 2012-12-10 DIAGNOSIS — R0789 Other chest pain: Secondary | ICD-10-CM | POA: Insufficient documentation

## 2012-12-10 DIAGNOSIS — Z792 Long term (current) use of antibiotics: Secondary | ICD-10-CM | POA: Insufficient documentation

## 2012-12-10 DIAGNOSIS — R51 Headache: Secondary | ICD-10-CM | POA: Insufficient documentation

## 2012-12-10 DIAGNOSIS — IMO0002 Reserved for concepts with insufficient information to code with codable children: Secondary | ICD-10-CM | POA: Insufficient documentation

## 2012-12-10 DIAGNOSIS — Z872 Personal history of diseases of the skin and subcutaneous tissue: Secondary | ICD-10-CM | POA: Insufficient documentation

## 2012-12-10 DIAGNOSIS — J069 Acute upper respiratory infection, unspecified: Secondary | ICD-10-CM | POA: Insufficient documentation

## 2012-12-10 DIAGNOSIS — H9209 Otalgia, unspecified ear: Secondary | ICD-10-CM | POA: Insufficient documentation

## 2012-12-10 DIAGNOSIS — R0602 Shortness of breath: Secondary | ICD-10-CM

## 2012-12-10 LAB — BASIC METABOLIC PANEL
BUN: 8 mg/dL (ref 6–23)
Calcium: 9.3 mg/dL (ref 8.4–10.5)
GFR calc Af Amer: >90 mL/min (ref >90)
GFR calc non Af Amer: >90 mL/min (ref >90)
Potassium: 3.7 mEq/L (ref 3.5–5.1)
Sodium: 138 mEq/L (ref 135–145)

## 2012-12-10 LAB — CBC
MCHC: 35.4 g/dL (ref 30.0–36.0)
MCV: 93.5 fL (ref 78.0–100.0)
Platelets: 322 10*3/uL (ref 150–400)
RDW: 13.8 % (ref 11.5–15.5)
WBC: 7.2 10*3/uL (ref 4.0–10.5)

## 2012-12-10 LAB — D-DIMER, QUANTITATIVE: D-Dimer, Quant: <0.27 ug/mL-FEU (ref 0.00–0.48)

## 2012-12-10 MED ORDER — HYDROCODONE-ACETAMINOPHEN 5-325 MG PO TABS
1.0000 | ORAL_TABLET | Freq: Once | ORAL | Status: DC
  Filled 2012-12-10: qty 1

## 2012-12-10 MED ORDER — DOXYCYCLINE HYCLATE 100 MG PO CAPS: 100.0000 mg | ORAL_CAPSULE | Freq: Two times a day (BID) | ORAL | Status: DC

## 2012-12-10 MED ORDER — OXYMETAZOLINE HCL 0.05 % NA SOLN
1.0000 | Freq: Once | NASAL | Status: AC
  Administered 2012-12-10: 1 via NASAL
  Filled 2012-12-10: qty 15

## 2012-12-10 MED ORDER — SODIUM CHLORIDE 0.9 % IV BOLUS (SEPSIS)
1000.0000 mL | Freq: Once | INTRAVENOUS | Status: AC
  Administered 2012-12-10: 1000 mL via INTRAVENOUS

## 2012-12-10 MED ORDER — FLUTICASONE PROPIONATE 50 MCG/ACT NA SUSP: 2.0000 | Freq: Every day | NASAL | Status: DC

## 2012-12-10 MED ORDER — HYDROCODONE-ACETAMINOPHEN 5-325 MG PO TABS
2.0000 | ORAL_TABLET | Freq: Once | ORAL | Status: AC
  Administered 2012-12-10: 2 via ORAL
  Filled 2012-12-10: qty 1

## 2012-12-10 NOTE — ED Provider Notes (Signed)
CSN: [id number]     Arrival date & time 12/10/12  1915 History   First MD Initiated Contact with Patient 12/10/12 1923     Chief Complaint  Patient presents with  . Chest Pain   (Consider location/radiation/quality/duration/timing/severity/associated sxs/prior Treatment) Patient is a 52 y.o. female presenting with chest pain. The history is provided by the patient and medical records. No language interpreter was used.  Chest Pain Associated symptoms: headache and shortness of breath   Associated symptoms: no abdominal pain, no back pain, no cough, no diaphoresis, no fatigue, no fever, no nausea, no numbness, no palpitations and not vomiting     Latera Mclin is a 52 y.o. female  with a hx of GERD, hypothyroid, hyperlipidemia presents to the Emergency Department complaining of gradual, intermittent, progressively worsening, sharp, centralized chest pain onset 3 days ago with associated SOB worse with exertion. Pt reports uri symptoms for about 1 week.  Associated symptoms include subjective fever, chills, sore throat.  Nothing makes it better and exertion makes it worse.  Pt denies neck pain, cough, abd pain, N/V/D, weakness, dizziness, syncope, rash, dysuria, hematuria.  Pt also endorses muscles cramps in her feet and legs for several weeks.     Past Medical History  Diagnosis Date  . GERD (gastroesophageal reflux disease)   . Discoid lupus   . Thyroid disease     hypo  . Hyperlipidemia   . Allergy    Past Surgical History  Procedure Laterality Date  . Abdominal hysterectomy    . Knee arthroplasty Right     Dr Valentina Gu  . Elbow surgery      Dr Jacqulyn Bath  . Oophorectomy    . Thyroidectomy      x 2 surgeries - partial first time  . Cholecystectomy    . Esophagogastroduodenoscopy  02/2011    Medoff, NEG  . Tubal ligation     Family History  Problem Relation Age of Onset  . Stroke Mother   . Hypertension Mother   . Diabetes Mother   . Heart disease Mother   . Hyperlipidemia Mother    . Hypothyroidism Mother   . Cancer Father     lung  . Cancer Brother     Colon CA   History  Substance Use Topics  . Smoking status: Former Smoker -- 0.50 packs/day for 30 years    Quit date: 05/21/2011  . Smokeless tobacco: Never Used  . Alcohol Use: No   OB History   Grav Para Term Preterm Abortions TAB SAB Ect Mult Living                 Review of Systems  Constitutional: Negative for fever, chills, diaphoresis, appetite change, fatigue and unexpected weight change.  HENT: Positive for congestion, ear pain, postnasal drip, rhinorrhea, sinus pressure and sore throat. Negative for ear discharge and mouth sores.   Eyes: Negative for visual disturbance.  Respiratory: Positive for shortness of breath. Negative for cough, chest tightness, wheezing and stridor.   Cardiovascular: Positive for chest pain. Negative for palpitations and leg swelling.  Gastrointestinal: Negative for nausea, vomiting, abdominal pain, diarrhea and constipation.  Endocrine: Negative for polydipsia, polyphagia and polyuria.  Genitourinary: Negative for dysuria, urgency, frequency and hematuria.  Musculoskeletal: Negative for arthralgias, back pain, myalgias and neck stiffness.  Skin: Negative for rash.  Allergic/Immunologic: Negative for immunocompromised state.  Neurological: Positive for headaches. Negative for syncope, light-headedness and numbness.  Hematological: Negative for adenopathy. Does not bruise/bleed easily.  Psychiatric/Behavioral: Negative for  sleep disturbance. The patient is not nervous/anxious.   All other systems reviewed and are negative.    Allergies  Review of patient's allergies indicates no known allergies.  Home Medications   Current Outpatient Rx  Name  Route  Sig  Dispense  Refill  . Ascorbic Acid (VITAMIN C) 1000 MG tablet   Oral   Take 1,000 mg by mouth daily.         Marland Kitchen BIOTIN PO   Oral   Take 1 tablet by mouth daily.         . Cyanocobalamin (VITAMIN B-12  PO)   Oral   Take 1 tablet by mouth daily.         . fexofenadine (ALLEGRA) 180 MG tablet   Oral   Take 180 mg by mouth daily.         Marland Kitchen gabapentin (NEURONTIN) 300 MG capsule   Oral   Take 300 mg by mouth as needed (pain).          Marland Kitchen levothyroxine (SYNTHROID, LEVOTHROID) 112 MCG tablet   Oral   Take 112 mcg by mouth daily before breakfast.         . Linaclotide (LINZESS) 145 MCG CAPS capsule   Oral   Take 145 mcg by mouth daily.         . Multiple Vitamins-Minerals (CENTRUM SILVER ADULT 50+ PO)   Oral   Take 1 tablet by mouth every other day.          . Omega-3 Fatty Acids (FISH OIL PO)   Oral   Take 1 capsule by mouth daily.         Marland Kitchen omeprazole (PRILOSEC) 40 MG capsule   Oral   Take 40 mg by mouth daily.         . phentermine 37.5 MG capsule   Oral   Take 18.75 mg by mouth every morning. Takes 1/2 tab daily=18.75mg          . pseudoephedrine (SUDAFED) 30 MG tablet   Oral   Take 60 mg by mouth 3 (three) times daily.          Marland Kitchen pyridoxine (B-6) 100 MG tablet   Oral   Take 100 mg by mouth daily.         Marland Kitchen VIVELLE-DOT 0.1 MG/24HR patch   Transdermal   Place 1 patch onto the skin 2 (two) times a week.          . zolpidem (AMBIEN) 10 MG tablet   Oral   Take 10 mg by mouth at bedtime.          Marland Kitchen doxycycline (VIBRAMYCIN) 100 MG capsule   Oral   Take 1 capsule (100 mg total) by mouth 2 (two) times daily.   20 capsule   0   . fluticasone (FLONASE) 50 MCG/ACT nasal spray   Each Nare   Place 2 sprays into both nostrils daily.   16 g   2   . valACYclovir (VALTREX) 1000 MG tablet   Oral   Take 1,000 mg by mouth daily as needed (fever blisters).           BP 125/97  Pulse 91  Temp(Src) 97.6 F (36.4 C) (Oral)  Resp 20  SpO2 100% Physical Exam  Nursing note and vitals reviewed. Constitutional: She is oriented to person, place, and time. She appears well-developed and well-nourished. No distress.  Awake, alert, nontoxic  appearance  HENT:  Head: Normocephalic and atraumatic.  Right Ear:  Tympanic membrane, external ear and ear canal normal.  Left Ear: Tympanic membrane, external ear and ear canal normal.  Nose: Mucosal edema and rhinorrhea present. No epistaxis. Right sinus exhibits maxillary sinus tenderness and frontal sinus tenderness. Left sinus exhibits maxillary sinus tenderness and frontal sinus tenderness.  Mouth/Throat: Uvula is midline, oropharynx is clear and moist and mucous membranes are normal. Mucous membranes are not pale and not cyanotic. No oropharyngeal exudate, posterior oropharyngeal edema, posterior oropharyngeal erythema or tonsillar abscesses.  Nasal congestion Mild sinus tenderness to tympany  Eyes: Conjunctivae are normal. Pupils are equal, round, and reactive to light. No scleral icterus.  Neck: Normal range of motion and full passive range of motion without pain. Neck supple.  Cardiovascular: Normal rate, regular rhythm, normal heart sounds and intact distal pulses.   No murmur heard. Pulses:      Radial pulses are 2+ on the right side, and 2+ on the left side.       Dorsalis pedis pulses are 2+ on the right side, and 2+ on the left side.       Posterior tibial pulses are 2+ on the right side, and 2+ on the left side.  Capillary refill < 3 sec  Pulmonary/Chest: Effort normal and breath sounds normal. No stridor. No respiratory distress. She has no wheezes.  Abdominal: Soft. Bowel sounds are normal. She exhibits no distension. There is no tenderness. There is no rebound and no guarding.  Musculoskeletal: Normal range of motion. She exhibits no edema.  Lymphadenopathy:    She has no cervical adenopathy.  Neurological: She is alert and oriented to person, place, and time. She exhibits normal muscle tone. Coordination normal.  Speech is clear and goal oriented Moves extremities without ataxia  Skin: Skin is warm and dry. No rash noted. She is not diaphoretic. No erythema.   Psychiatric: She has a normal mood and affect.    ED Course  Procedures (including critical care time) Labs Review Labs Reviewed  BASIC METABOLIC PANEL  TROPONIN I  D-DIMER, QUANTITATIVE  CBC   Imaging Review Dg Chest 2 View  12/10/2012   CLINICAL DATA:  Chest pain and shortness of breath  EXAM: CHEST  2 VIEW  COMPARISON:  February 19, 2007  FINDINGS: The heart size and mediastinal contours are within normal limits. Both lungs are clear. The visualized skeletal structures are stable.  IMPRESSION: No active cardiopulmonary disease.   Electronically Signed   By: Sherian Rein M.D.   On: 12/10/2012 20:43    EKG Interpretation    Date/Time:  Friday December 10 2012 19:22:39 EST Ventricular Rate:  80 PR Interval:  149 QRS Duration: 92 QT Interval:  363 QTC Calculation: 419 R Axis:   95 Text Interpretation:  Sinus rhythm Borderline right axis deviation Baseline wander in lead(s) I II aVR No old tracing to compare Confirmed by [facility]  MD, CHARLES [phone]) on 12/10/2012 7:46:28 PM            MDM   1. Viral URI   2. Atypical chest pain   3. Sinusitis      Lujuana Kapler presents with several weeks of URI symptoms and tenderness to palpation of the sinuses.  Patient complaining of symptoms of sinusitis.    Severe symptoms have been present for greater than 10 days with maxillary sinus pain.  Concern for  bacterial rhinosinusitis.  Patient discharged with doxycycline.  Instructions given for warm saline nasal wash and recommendations for follow-up with primary care physician.  Pt CXR negative for acute infiltrate. Patients symptoms are consistent with URI, likely initially viral etiology.  Pt will be discharged with symptomatic treatment.    Chest pain is not likely of cardiac or pulmonary etiology d/t presentation, perc negative, VSS, no tracheal deviation, no JVD or new murmur, RRR, breath sounds equal bilaterally, EKG without acute abnormalities, negative troponin, and  negative CXR. Pt has been advised to treat URI and return to the ED if CP becomes exertional, associated with diaphoresis or nausea, radiates to left jaw/arm, worsens or becomes concerning in any way. Pt appears reliable for follow up and is agreeable to discharge.   Case has been discussed with Dr. Bernette Mayers who agrees with the above plan to discharge.   It has been determined that no acute conditions requiring further emergency intervention are present at this time. The patient/guardian have been advised of the diagnosis and plan. We have discussed signs and symptoms that warrant return to the ED, such as changes or worsening in symptoms.   Vital signs are stable at discharge.   BP 125/97  Pulse 91  Temp(Src) 97.6 F (36.4 C) (Oral)  Resp 20  SpO2 100%  Patient/guardian has voiced understanding and agreed to follow-up with the PCP or specialist.        Dierdre Forth, PA-C 12/10/12 2155

## 2012-12-10 NOTE — ED Notes (Signed)
Pt states she is chest pain free at this time. States that she has been feeling bad for a few weeks. States that she has been having sinus pain.

## 2012-12-10 NOTE — ED Provider Notes (Signed)
Medical screening examination/treatment/procedure(s) were performed by non-physician practitioner and as supervising physician I was immediately available for consultation/collaboration.  EKG Interpretation    Date/Time:  Friday December 10 2012 19:22:39 EST Ventricular Rate:  80 PR Interval:  149 QRS Duration: 92 QT Interval:  363 QTC Calculation: 419 R Axis:   95 Text Interpretation:  Sinus rhythm Borderline right axis deviation Baseline wander in lead(s) I II aVR No old tracing to compare Confirmed by [facility]  MD, Galileah Piggee [phone]) on 12/10/2012 7:46:28 PM              Leonette Most B. Bernette Mayers, MD 12/10/12 2200

## 2012-12-10 NOTE — Progress Notes (Signed)
[facility] [address] [place] [address] [phone]  Date:  12/10/2012   Name:  Jasmine Hahn   DOB:  October 29, 1960   MRN:  [id number]  PCP:  Nadean Corwin, MD    Chief Complaint: Shortness of Breath, Nasal Congestion and Headache   History of Present Illness:  Jasmine Hahn is a 52 y.o. very pleasant female patient who presents with the following:  Yesterday evening developed a heavy pressure in her chest that was not radiating.  Passed after 10 minutes or so.  Developed pain again today before lunch.  Again not radiating. She describes it as distinctly different from the burning pain of her GERD.  She says she felt a little short of breath and had a sensation of a rapid heart rate with the pain.  She had a third episode of pain in our waiting room.  She has no history of DM or HBP.  No history of premature cardiac death in family.  Stopped smoking 2 years ago.  Post menopausal.  No improvement with over the counter medications or other home remedies. Denies other complaint or health concern today. [facility] come here for chest xray.    Patient Active Problem List   Diagnosis Date Noted  . GERD (gastroesophageal reflux disease)   . Discoid lupus   . Thyroid disease   . Hyperlipidemia   . URI 08/23/2007  . FATIGUE 08/23/2007  . SINUSITIS- ACUTE-NOS 12/29/2006  . HYPOTHYROIDISM 06/26/2006  . MAXILLARY SINUSITIS 06/26/2006  . ALLERGIC RHINITIS, SEASONAL 06/26/2006  . GERD 06/26/2006  . INSOMNIA 06/26/2006    Past Medical History  Diagnosis Date  . GERD (gastroesophageal reflux disease)   . Discoid lupus   . Thyroid disease     hypo  . Hyperlipidemia   . Allergy     Past Surgical History  Procedure Laterality Date  . Abdominal hysterectomy    . Knee arthroplasty Right     Dr Valentina Gu  . Elbow surgery      Dr Jacqulyn Bath  . Oophorectomy    . Thyroidectomy      x 2 surgeries - partial first time  .  Cholecystectomy    . Esophagogastroduodenoscopy  02/2011    Medoff, NEG  . Tubal ligation      History  Substance Use Topics  . Smoking status: Former Smoker -- 0.50 packs/day for 30 years    Quit date: 05/21/2011  . Smokeless tobacco: Never Used  . Alcohol Use: No    Family History  Problem Relation Age of Onset  . Stroke Mother   . Hypertension Mother   . Diabetes Mother   . Heart disease Mother   . Hyperlipidemia Mother   . Hypothyroidism Mother   . Cancer Father     lung  . Cancer Brother     Colon CA    No Known Allergies  Medication list has been reviewed and updated.  Current Outpatient Prescriptions on File Prior to Visit  Medication Sig Dispense Refill  . Ascorbic Acid (VITAMIN C) 1000 MG tablet Take 1,000 mg by mouth daily.      Marland Kitchen BIOTIN PO Take 1 tablet by mouth daily.      . Cyanocobalamin (VITAMIN B-12 PO) Take 1 tablet by mouth daily.      Marland Kitchen gabapentin (NEURONTIN) 300 MG capsule Take 300 mg by mouth as needed.      Marland Kitchen levothyroxine (SYNTHROID, LEVOTHROID) 112 MCG  tablet Take 112 mcg by mouth daily before breakfast.      . Linaclotide (LINZESS) 145 MCG CAPS capsule Take 145 mcg by mouth daily.      . Multiple Vitamins-Minerals (CENTRUM SILVER ADULT 50+ PO) Take by mouth.      . Omega-3 Fatty Acids (FISH OIL PO) Take 1 capsule by mouth daily.      Marland Kitchen omeprazole (PRILOSEC) 40 MG capsule Take 40 mg by mouth daily.      . phentermine 37.5 MG capsule Take 18.75 mg by mouth every morning. Takes 1/2 tab daily=18.75mg       . pyridoxine (B-6) 100 MG tablet Take 100 mg by mouth daily.      Marland Kitchen zolpidem (AMBIEN) 10 MG tablet Take 10 mg by mouth at bedtime as needed.      . celecoxib (CELEBREX) 100 MG capsule Take 1 capsule (100 mg total) by mouth daily.  30 capsule  2  . cetirizine (ZYRTEC) 10 MG tablet Take 10 mg by mouth daily.      . valACYclovir (VALTREX) 1000 MG tablet        No current facility-administered medications on file prior to visit.    Review of  Systems:  As per HPI, otherwise negative.    Physical Examination: Filed Vitals:   12/10/12 1754  BP: 110/68  Pulse: 90  Temp: 97.9 F (36.6 C)  Resp: 18   Filed Vitals:   12/10/12 1754  Height: 5\' 1"  (1.549 m)  Weight: 142 lb 6.4 oz (64.592 kg)   Body mass index is 26.92 kg/(m^2). Ideal Body Weight: Weight in (lb) to have BMI = 25: 132  GEN: WDWN, NAD, Non-toxic, A & O x 3 HEENT: Atraumatic, Normocephalic. Neck supple. No masses, No LAD. Ears and Nose: No external deformity. CV: RRR, No M/G/R. No JVD. No thrill. No extra heart sounds. PULM: CTA B, no wheezes, crackles, rhonchi. No retractions. No resp. distress. No accessory muscle use. ABD: S, NT, ND, +BS. No rebound. No HSM. EXTR: No c/c/e NEURO Normal gait.  PSYCH: Normally interactive. Conversant. Not depressed or anxious appearing.  Calm demeanor.    Assessment and Plan: Chest pain Unstable angina No change on EKG To ER via EMS   Signed,  Phillips Odor, MD

## 2012-12-10 NOTE — ED Notes (Signed)
Pt arrives via EMS from urgent care c/o non-radiating chest pain. HX of GERD but pt states this is different pain. Pt felt pain when exerting herself. Episodes have been on and off for 3 days. Pt is pain free upon arrival. IV ASA given and 2L [place] at urgent care.

## 2012-12-10 NOTE — ED Notes (Signed)
Pt taken to xray 

## 2013-01-31 ENCOUNTER — Encounter: Payer: Self-pay | Admitting: Internal Medicine

## 2013-02-01 ENCOUNTER — Encounter: Payer: Self-pay | Admitting: Emergency Medicine

## 2013-02-01 ENCOUNTER — Ambulatory Visit (INDEPENDENT_AMBULATORY_CARE_PROVIDER_SITE_OTHER): Payer: 59 | Admitting: Emergency Medicine

## 2013-02-01 VITALS — BP 118/80 | HR 80 | Temp 98.4°F | Resp 18 | Ht 61.0 in | Wt 142.0 lb

## 2013-02-01 DIAGNOSIS — J309 Allergic rhinitis, unspecified: Secondary | ICD-10-CM

## 2013-02-01 DIAGNOSIS — J329 Chronic sinusitis, unspecified: Secondary | ICD-10-CM

## 2013-02-01 MED ORDER — AZITHROMYCIN 250 MG PO TABS: ORAL_TABLET | ORAL | Status: AC

## 2013-02-01 MED ORDER — DEXAMETHASONE SODIUM PHOSPHATE 100 MG/10ML IJ SOLN
10.0000 mg | Freq: Once | INTRAMUSCULAR | Status: AC
  Administered 2013-02-01: 10 mg via INTRAMUSCULAR

## 2013-02-01 MED ORDER — LINACLOTIDE 145 MCG PO CAPS: 145.0000 ug | ORAL_CAPSULE | Freq: Every day | ORAL | Status: DC
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
## 2013-02-01 NOTE — Patient Instructions (Signed)
Sinusitis Sinusitis is redness, soreness, and puffiness (inflammation) of the air pockets in the bones of your face (sinuses). The redness, soreness, and puffiness can cause air and mucus to get trapped in your sinuses. This can allow germs to grow and cause an infection.  HOME CARE   Drink enough fluids to keep your pee (urine) clear or pale yellow.  Use a humidifier in your home.  Run a hot shower to create steam in the bathroom. Sit in the bathroom with the door closed. Breathe in the steam 3 4 times a day.  Put a warm, moist washcloth on your face 3 4 times a day, or as told by your doctor.  Use salt water sprays (saline sprays) to wet the thick fluid in your nose. This can help the sinuses drain.  Only take medicine as told by your doctor. GET HELP RIGHT AWAY IF:   Your pain gets worse.  You have very bad headaches.  You are sick to your stomach (nauseous).  You throw up (vomit).  You are very sleepy (drowsy) all the time.  Your face is puffy (swollen).  Your vision changes.  You have a stiff neck.  You have trouble breathing. MAKE SURE YOU:   Understand these instructions.  Will watch your condition.  Will get help right away if you are not doing well or get worse. Document Released: 06/25/2007 Document Revised: 10/01/2011 Document Reviewed: 08/12/2011 [employer] Patient Information 2014 [employer].  

## 2013-02-02 MED ORDER — FLUCONAZOLE 150 MG PO TABS: 150.0000 mg | ORAL_TABLET | Freq: Every day | ORAL | Status: DC

## 2013-02-28 ENCOUNTER — Ambulatory Visit (INDEPENDENT_AMBULATORY_CARE_PROVIDER_SITE_OTHER): Payer: 59 | Admitting: Emergency Medicine

## 2013-02-28 VITALS — BP 118/82 | HR 78 | Temp 98.0°F | Resp 18 | Ht 61.0 in | Wt 140.0 lb

## 2013-02-28 DIAGNOSIS — J309 Allergic rhinitis, unspecified: Secondary | ICD-10-CM

## 2013-02-28 DIAGNOSIS — J329 Chronic sinusitis, unspecified: Secondary | ICD-10-CM

## 2013-02-28 MED ORDER — CEFTRIAXONE SODIUM 1 G IJ SOLR
1.0000 g | Freq: Once | INTRAMUSCULAR | Status: AC
  Administered 2013-02-28: 1 g via INTRAMUSCULAR

## 2013-02-28 MED ORDER — DEXAMETHASONE SODIUM PHOSPHATE 100 MG/10ML IJ SOLN
10.0000 mg | Freq: Once | INTRAMUSCULAR | Status: AC
  Administered 2013-02-28: 10 mg via INTRAMUSCULAR

## 2013-02-28 MED ORDER — LEVOFLOXACIN 500 MG PO TABS: 500.0000 mg | ORAL_TABLET | Freq: Every day | ORAL | Status: AC

## 2013-02-28 NOTE — Progress Notes (Signed)
Subjective:    Patient ID: Jasmine Hahn, female    DOB: Jul 27, 1960, 53 y.o.   MRN: [id number]  HPI Comments: 53 yo female with recurrent sinus infections. She was treated with Zpak 02/01/13 and symptoms resolved except right ear still full and left side of eye/ face feels full. She notes chest discomfot only with cough. She denies SOB. She had eye evaluation that was negative for tremor/ twitch. She has not tried any otc for cold.   Headache  Associated symptoms include coughing, a fever and sinus pressure.  Sinusitis Associated symptoms include congestion, coughing, headaches and sinus pressure.  Fever  Associated symptoms include congestion, coughing and headaches.  Sore Throat  Associated symptoms include congestion, coughing and headaches.  Cough Associated symptoms include a fever, headaches and postnasal drip.   Current Outpatient Prescriptions on File Prior to Visit  Medication Sig Dispense Refill  . Ascorbic Acid (VITAMIN C) 1000 MG tablet Take 1,000 mg by mouth daily.      Marland Kitchen BIOTIN PO Take 1 tablet by mouth daily.      . Cyanocobalamin (VITAMIN B-12 PO) Take 1 tablet by mouth daily.      Marland Kitchen levothyroxine (SYNTHROID, LEVOTHROID) 112 MCG tablet Take 112 mcg by mouth daily before breakfast.      . Linaclotide (LINZESS) 145 MCG CAPS capsule Take 1 capsule (145 mcg total) by mouth daily.  90 capsule  1  . Multiple Vitamins-Minerals (CENTRUM SILVER ADULT 50+ PO) Take 1 tablet by mouth every other day.       . Omega-3 Fatty Acids (FISH OIL PO) Take 1 capsule by mouth daily.      Marland Kitchen omeprazole (PRILOSEC) 40 MG capsule Take 40 mg by mouth daily.      . pseudoephedrine-guaifenesin (MUCINEX D) 60-600 MG per tablet Take 1 tablet by mouth every 12 (twelve) hours.      . pyridoxine (B-6) 100 MG tablet Take 100 mg by mouth daily.      Marland Kitchen VIVELLE-DOT 0.1 MG/24HR patch Place 1 patch onto the skin 2 (two) times a week.       . zolpidem (AMBIEN) 10 MG tablet Take 10 mg by mouth at bedtime.        . fexofenadine (ALLEGRA) 180 MG tablet Take 180 mg by mouth daily.      . fluticasone (FLONASE) 50 MCG/ACT nasal spray Place 2 sprays into both nostrils daily.  16 g  2  . valACYclovir (VALTREX) 1000 MG tablet Take 1,000 mg by mouth daily as needed (fever blisters).        No current facility-administered medications on file prior to visit.   No Known Allergies  Past Medical History  Diagnosis Date  . GERD (gastroesophageal reflux disease)   . Discoid lupus   . Thyroid disease     hypo  . Hyperlipidemia   . Allergy       Review of Systems  Constitutional: Positive for fever.  HENT: Positive for congestion, postnasal drip and sinus pressure.   Respiratory: Positive for cough.   Neurological: Positive for headaches.  All other systems reviewed and are negative.   BP 118/82  Pulse 78  Temp(Src) 98 F (36.7 C) (Temporal)  Resp 18  Ht 5\' 1"  (1.549 m)  Wt 140 lb (63.504 kg)  BMI 26.47 kg/m2     Objective:   Physical Exam  Nursing note and vitals reviewed. Constitutional: She is oriented to person, place, and time. She appears well-developed and well-nourished.  HENT:  Head: Normocephalic and atraumatic.  Right Ear: External ear normal.  Left Ear: External ear normal.  Nose: Nose normal.  Mouth/Throat: Oropharynx is clear and moist. No oropharyngeal exudate.  Maxillary tenderness Frontal tenderness    Eyes: Conjunctivae and EOM are normal.  Neck: Normal range of motion.  Cardiovascular: Normal rate, regular rhythm, normal heart sounds and intact distal pulses.   Pulmonary/Chest: Effort normal and breath sounds normal.  Musculoskeletal: Normal range of motion.  Lymphadenopathy:    She has no cervical adenopathy.  Neurological: She is alert and oriented to person, place, and time.  Skin: Skin is warm and dry.  Psychiatric: She has a normal mood and affect. Judgment normal.          Assessment & Plan:  Sinusitis/ Allergic rhinitis- Allegra OTC, increase  H2o, allergy hygiene explained. Rocephin 1 gm, dexamethasone 10mg . If no change Levaquin 500 Ad and/ or ref ENT vs CT Sinus

## 2013-02-28 NOTE — Patient Instructions (Signed)
Sinusitis Sinusitis is redness, soreness, and puffiness (inflammation) of the air pockets in the bones of your face (sinuses). The redness, soreness, and puffiness can cause air and mucus to get trapped in your sinuses. This can allow germs to grow and cause an infection.  HOME CARE   Drink enough fluids to keep your pee (urine) clear or pale yellow.  Use a humidifier in your home.  Run a hot shower to create steam in the bathroom. Sit in the bathroom with the door closed. Breathe in the steam 3 4 times a day.  Put a warm, moist washcloth on your face 3 4 times a day, or as told by your doctor.  Use salt water sprays (saline sprays) to wet the thick fluid in your nose. This can help the sinuses drain.  Only take medicine as told by your doctor. GET HELP RIGHT AWAY IF:   Your pain gets worse.  You have very bad headaches.  You are sick to your stomach (nauseous).  You throw up (vomit).  You are very sleepy (drowsy) all the time.  Your face is puffy (swollen).  Your vision changes.  You have a stiff neck.  You have trouble breathing. MAKE SURE YOU:   Understand these instructions.  Will watch your condition.  Will get help right away if you are not doing well or get worse. Document Released: 06/25/2007 Document Revised: 10/01/2011 Document Reviewed: 08/12/2011 [employer] Patient Information 2014 [employer].  

## 2013-03-01 ENCOUNTER — Encounter: Payer: Self-pay | Admitting: Emergency Medicine

## 2013-03-03 ENCOUNTER — Other Ambulatory Visit: Payer: Self-pay | Admitting: Emergency Medicine

## 2013-03-03 DIAGNOSIS — J329 Chronic sinusitis, unspecified: Secondary | ICD-10-CM

## 2013-07-26 ENCOUNTER — Other Ambulatory Visit: Payer: Self-pay | Admitting: Emergency Medicine

## 2013-10-04 ENCOUNTER — Encounter: Payer: Self-pay | Admitting: Emergency Medicine

## 2013-10-05 ENCOUNTER — Other Ambulatory Visit: Payer: Self-pay | Admitting: Emergency Medicine

## 2013-10-05 MED ORDER — CHLORHEXIDINE GLUCONATE 0.12 % MT SOLN: 15.0000 mL | Freq: Two times a day (BID) | OROMUCOSAL | Status: DC

## 2013-10-17 ENCOUNTER — Encounter: Payer: Self-pay | Admitting: Emergency Medicine

## 2013-10-17 ENCOUNTER — Other Ambulatory Visit: Payer: Self-pay | Admitting: Physician Assistant

## 2013-10-17 MED ORDER — OMEPRAZOLE 40 MG PO CPDR: 40.0000 mg | DELAYED_RELEASE_CAPSULE | Freq: Every day | ORAL | Status: DC

## 2013-11-12 ENCOUNTER — Other Ambulatory Visit: Payer: Self-pay | Admitting: Physician Assistant

## 2013-11-12 ENCOUNTER — Other Ambulatory Visit: Payer: Self-pay | Admitting: Internal Medicine

## 2013-11-26 ENCOUNTER — Other Ambulatory Visit: Payer: Self-pay | Admitting: Emergency Medicine

## 2013-12-02 ENCOUNTER — Encounter: Payer: Self-pay | Admitting: Emergency Medicine

## 2013-12-02 MED ORDER — PREDNISONE 20 MG PO TABS: ORAL_TABLET | ORAL | Status: DC

## 2013-12-02 MED ORDER — AZITHROMYCIN 250 MG PO TABS: ORAL_TABLET | ORAL | Status: AC

## 2013-12-10 ENCOUNTER — Other Ambulatory Visit: Payer: Self-pay | Admitting: Physician Assistant

## 2013-12-21 ENCOUNTER — Ambulatory Visit (INDEPENDENT_AMBULATORY_CARE_PROVIDER_SITE_OTHER): Payer: 59 | Admitting: Physician Assistant

## 2013-12-21 ENCOUNTER — Encounter: Payer: Self-pay | Admitting: Physician Assistant

## 2013-12-21 VITALS — BP 108/66 | HR 92 | Temp 98.6°F | Resp 18 | Ht 61.0 in | Wt 141.0 lb

## 2013-12-21 DIAGNOSIS — R110 Nausea: Secondary | ICD-10-CM

## 2013-12-21 DIAGNOSIS — K219 Gastro-esophageal reflux disease without esophagitis: Secondary | ICD-10-CM

## 2013-12-21 DIAGNOSIS — L988 Other specified disorders of the skin and subcutaneous tissue: Secondary | ICD-10-CM

## 2013-12-21 DIAGNOSIS — J320 Chronic maxillary sinusitis: Secondary | ICD-10-CM

## 2013-12-21 MED ORDER — DEXAMETHASONE SODIUM PHOSPHATE 100 MG/10ML IJ SOLN
10.0000 mg | Freq: Once | INTRAMUSCULAR | Status: AC
  Administered 2013-12-21: 10 mg via INTRAMUSCULAR

## 2013-12-21 MED ORDER — OMEPRAZOLE 40 MG PO CPDR: 80.0000 mg | DELAYED_RELEASE_CAPSULE | Freq: Every day | ORAL | Status: DC

## 2013-12-21 MED ORDER — ONDANSETRON HCL 4 MG PO TABS: 4.0000 mg | ORAL_TABLET | Freq: Three times a day (TID) | ORAL | Status: DC | PRN

## 2013-12-21 MED ORDER — MOMETASONE FUROATE 50 MCG/ACT NA SUSP: 2.0000 | Freq: Every day | NASAL | Status: DC

## 2013-12-21 MED ORDER — DOXYCYCLINE HYCLATE 100 MG PO TABS: 100.0000 mg | ORAL_TABLET | Freq: Two times a day (BID) | ORAL | Status: AC

## 2013-12-21 NOTE — Patient Instructions (Signed)
- Take Allegra or Zyrtec OTC for allergies and to help with fluid behind ear drums.   -While drinking fluids, pinch and hold nose close and swallow.  This will help open up your eustachian tubes to drain the fluid behind your ear drums. -Try steam showers to open your nasal passages.  Drink lots of water to stay hydrated and to thin mucous. -I sent in prescription for Nasonex to help the inflammation.  Take 2 sprays in each nostril at bedtime.  Make sure you spray towards the outside of each nostril towards the outer corner of your eye, hold nose close and tilt head back.  This will help the medication get into your sinuses.  If you do not like this medication, then use saline nasal sprays same directions as above for Nasonex.  -It can take up to 2 weeks to feel better.  Sinusitis is mostly caused by viruses.  Take Doxycycline as prescribed for 5 days.  Take with food. Take Zofran as prescribed for nausea.  If you are not feeling better in 10-14 days, then please call the office.    Sinusitis Sinusitis is redness, soreness, and inflammation of the paranasal sinuses. Paranasal sinuses are air pockets within the bones of your face (beneath the eyes, the middle of the forehead, or above the eyes). In healthy paranasal sinuses, mucus is able to drain out, and air is able to circulate through them by way of your nose. However, when your paranasal sinuses are inflamed, mucus and air can become trapped. This can allow bacteria and other germs to grow and cause infection. Sinusitis can develop quickly and last only a short time (acute) or continue over a long period (chronic). Sinusitis that lasts for more than 12 weeks is considered chronic.  CAUSES  Causes of sinusitis include:  Allergies.  Structural abnormalities, such as displacement of the cartilage that separates your nostrils (deviated septum), which can decrease the air flow through your nose and sinuses and affect sinus drainage.  Functional  abnormalities, such as when the small hairs (cilia) that line your sinuses and help remove mucus do not work properly or are not present. SIGNS AND SYMPTOMS  Symptoms of acute and chronic sinusitis are the same. The primary symptoms are pain and pressure around the affected sinuses. Other symptoms include:  Upper toothache.  Earache.  Headache.  Bad breath.  Decreased sense of smell and taste.  A cough, which worsens when you are lying flat.  Fatigue.  Fever.  Thick drainage from your nose, which often is green and may contain pus (purulent).  Swelling and warmth over the affected sinuses. DIAGNOSIS  Your health care provider will perform a physical exam. During the exam, your health care provider may:  Look in your nose for signs of abnormal growths in your nostrils (nasal polyps).  Tap over the affected sinus to check for signs of infection.  View the inside of your sinuses (endoscopy) using an imaging device that has a light attached (endoscope). If your health care provider suspects that you have chronic sinusitis, one or more of the following tests may be recommended:  Allergy tests.  Nasal culture. A sample of mucus is taken from your nose, sent to a lab, and screened for bacteria.  Nasal cytology. A sample of mucus is taken from your nose and examined by your health care provider to determine if your sinusitis is related to an allergy. TREATMENT  Most cases of acute sinusitis are related to a viral infection  and will resolve on their own within 10 days. Sometimes medicines are prescribed to help relieve symptoms (pain medicine, decongestants, nasal steroid sprays, or saline sprays).  However, for sinusitis related to a bacterial infection, your health care provider will prescribe antibiotic medicines. These are medicines that will help kill the bacteria causing the infection.  Rarely, sinusitis is caused by a fungal infection. In theses cases, your health care provider  will prescribe antifungal medicine. For some cases of chronic sinusitis, surgery is needed. Generally, these are cases in which sinusitis recurs more than 3 times per year, despite other treatments. HOME CARE INSTRUCTIONS   Drink plenty of water. Water helps thin the mucus so your sinuses can drain more easily.  Use a humidifier.  Inhale steam 3 to 4 times a day (for example, sit in the bathroom with the shower running).  Apply a warm, moist washcloth to your face 3 to 4 times a day, or as directed by your health care provider.  Use saline nasal sprays to help moisten and clean your sinuses.  Take medicines only as directed by your health care provider.  If you were prescribed either an antibiotic or antifungal medicine, finish it all even if you start to feel better. SEEK IMMEDIATE MEDICAL CARE IF:  You have increasing pain or severe headaches.  You have nausea, vomiting, or drowsiness.  You have swelling around your face.  You have vision problems.  You have a stiff neck.  You have difficulty breathing. MAKE SURE YOU:   Understand these instructions.  Will watch your condition.  Will get help right away if you are not doing well or get worse. Document Released: 01/06/2005 Document Revised: 05/23/2013 Document Reviewed: 01/21/2011 [facility] Patient Information 2015 [place], [place]. This information is not intended to replace advice given to you by your health care provider. Make sure you discuss any questions you have with your health care provider.

## 2013-12-21 NOTE — Progress Notes (Signed)
Subjective:    Patient ID: Jasmine Hahn, female    DOB: 1960-08-21, 53 y.o.   MRN: [id number]  Sinusitis This is a recurrent problem. Episode onset: Was given RX on 12/02/13.  Patient has recurrent sinus infections. The problem is unchanged. Associated symptoms include congestion, diaphoresis, ear pain, headaches and sinus pressure. Pertinent negatives include no chills, shortness of breath or sore throat. Treatments tried: Patient messaged through MyChart on 12/02/13.  Patient states she was taking Sudafed and it was not helping sinus infection.  Patient was given Prednisone and Z-Pak by Quentin Mulling, PA-C on 12/02/13.   Marchelle Folks also told patient to take Tylenol, allergy OTC medication and either Flonase or Nasonex.  Patient states she does not do good on Prednisone (agitated) and did not take that prescription.  She is taking Allegra every day and 12Hr Sudafed.  She is not taking the Flonase.  She was referred to Dr. Narda Bonds in February 2015.  She states that he needed to see her while she was having an infection and that she "does need a clean out".  She states he did do CT scan and that he was too expensive.  She is currently not interested in seeing any ENT specialist.  There was a CT Maxillofacial WO CM done on 03/03/13- Showed Mild mucosal thickening in several ethmoid air cells bilaterally.  Patient is concerned about skin eruptions on right cheek and left upper lip.  States she concerned if they are cancerous.  I told patient to follow up with Dr. Nita Sells (Dermatologist).  Patient states she does where sunscreen.  She also states that she notices the one on her left upper lip will not heal.  She does admit to picking at the scab on her left upper lip.  Taking Prilosec 40mg  1 capsule in morning and sometimes one at night per Dr. Kinnie Scales (GI doctor).  Patient states she needs a refill of that prescription.  She states she needs to take the two capsules a day due to bad reflux during the  day and sometimes at night.  I told patient that I will refill it for BID dosing, but only for ONE month.  Told patient to contact Dr. Jennye Boroughs office for appointment.  Review of Systems  Constitutional: Positive for diaphoresis and fatigue. Negative for fever and chills.  HENT: Positive for congestion, ear pain, postnasal drip and sinus pressure. Negative for ear discharge, rhinorrhea, sore throat and trouble swallowing.   Eyes: Negative.   Respiratory: Negative for chest tightness, shortness of breath and wheezing.   Cardiovascular: Negative.   Gastrointestinal: Positive for nausea. Negative for vomiting, abdominal pain, diarrhea and constipation.  Genitourinary: Negative.   Skin: Negative.  Negative for rash.  Neurological: Positive for dizziness, light-headedness and headaches.   Past Medical History  Diagnosis Date  . GERD (gastroesophageal reflux disease)   . Discoid lupus   . Thyroid disease     hypo  . Hyperlipidemia   . Allergy    Current Outpatient Prescriptions on File Prior to Visit  Medication Sig Dispense Refill  . Ascorbic Acid (VITAMIN C) 1000 MG tablet Take 1,000 mg by mouth daily.    Marland Kitchen BIOTIN PO Take 1 tablet by mouth daily.    . chlorhexidine (PERIDEX) 0.12 % solution Use as directed 15 mLs in the mouth or throat 2 (two) times daily. 120 mL 0  . Cyanocobalamin (VITAMIN B-12 PO) Take 1 tablet by mouth daily.    . fexofenadine (ALLEGRA) 180 MG tablet  Take 180 mg by mouth daily.    . fluticasone (FLONASE) 50 MCG/ACT nasal spray Place 2 sprays into both nostrils daily. 16 g 2  . Linaclotide (LINZESS) 145 MCG CAPS capsule 1 capsule daily fo constipation  - (will need office visit before further refills) 30 capsule 0  . Multiple Vitamins-Minerals (CENTRUM SILVER ADULT 50+ PO) Take 1 tablet by mouth every other day.     . Omega-3 Fatty Acids (FISH OIL PO) Take 1 capsule by mouth daily.    Marland Kitchen omeprazole (PRILOSEC) 40 MG capsule TAKE 1 CAPSULE (40 MG TOTAL) BY MOUTH DAILY.  (Patient taking differently: Take 2 capsules daily) 30 capsule 2  . pyridoxine (B-6) 100 MG tablet Take 100 mg by mouth daily.    . valACYclovir (VALTREX) 1000 MG tablet Take 1,000 mg by mouth daily as needed (fever blisters).     . VIVELLE-DOT 0.1 MG/24HR patch Place 1 patch onto the skin 2 (two) times a week.     . zolpidem (AMBIEN) 10 MG tablet Take 10 mg by mouth at bedtime.      No current facility-administered medications on file prior to visit.   No Known Allergies  BP 108/66 mmHg  Pulse 92  Temp(Src) 98.6 F (37 C) (Temporal)  Resp 18  Ht 5\' 1"  (1.549 m)  Wt 141 lb (63.957 kg)  BMI 26.66 kg/m2  SpO2 98% Wt Readings from Last 3 Encounters:  12/21/13 141 lb (63.957 kg)  02/28/13 140 lb (63.504 kg)  02/01/13 142 lb (64.411 kg)   Objective:   Physical Exam  Constitutional: She is oriented to person, place, and time. She appears well-developed and well-nourished. She has a sickly appearance. No distress.  HENT:  Head: Normocephalic.  Right Ear: External ear and ear canal normal. Tympanic membrane is bulging.  Left Ear: External ear and ear canal normal. Tympanic membrane is bulging.  Nose: Mucosal edema and rhinorrhea present. Right sinus exhibits maxillary sinus tenderness and frontal sinus tenderness. Left sinus exhibits maxillary sinus tenderness and frontal sinus tenderness.  Mouth/Throat: Uvula is midline and mucous membranes are normal. Mucous membranes are not pale and not dry. No uvula swelling. Posterior oropharyngeal erythema present. No oropharyngeal exudate, posterior oropharyngeal edema or tonsillar abscesses.  TMs bulging with clear fluid bilaterally.  TMs non-erythematous and non-edematous bilaterally. Turbinates erythematous bilaterally.  Eyes: Conjunctivae and lids are normal. Pupils are equal, round, and reactive to light. Right eye exhibits no discharge. Left eye exhibits no discharge. No scleral icterus.  Neck: Trachea normal, normal range of motion and  phonation normal. Neck supple. No tracheal deviation present.  Cardiovascular: Normal rate, regular rhythm, S1 normal, S2 normal, normal heart sounds and normal pulses.  Exam reveals no gallop, no distant heart sounds and no friction rub.   No murmur heard. Pulmonary/Chest: Effort normal and breath sounds normal. No stridor. No respiratory distress. She has no decreased breath sounds. She has no wheezes. She has no rhonchi. She has no rales. She exhibits no tenderness.  Abdominal: Soft. Bowel sounds are normal. There is no tenderness. There is no rebound and no guarding.  Lymphadenopathy:  No tenderness or LAD.  Neurological: She is alert and oriented to person, place, and time. Gait normal.  Skin: Skin is warm, dry and intact. Rash noted. Rash is macular.     Psychiatric: She has a normal mood and affect. Her speech is normal and behavior is normal. Judgment and thought content normal. Cognition and memory are normal.  Vitals reviewed.  Assessment & Plan:  1. MAXILLARY SINUSITIS -Take Doxycycline as prescribed- doxycycline (VIBRA-TABS) 100 MG tablet; Take 1 tablet (100 mg total) by mouth 2 (two) times daily. 5 days  Dispense: 10 tablet; Refill: 0 - Take Nasonex as prescribed for inflammation (Gave patient 2 samples)- mometasone (NASONEX) 50 MCG/ACT nasal spray; Place 2 sprays into the nose daily.  Dispense: 17 g; Refill: 2 - Gave Decadron shot in office due to patient not tolerating prednisone pills- dexamethasone (DECADRON) injection 10 mg; Inject 1 mL (10 mg total) into the muscle once.  2. Nausea without vomiting -Take Zofran as prescribed for nausea- ondansetron (ZOFRAN) 4 MG tablet; Take 1 tablet (4 mg total) by mouth every 8 (eight) hours as needed for nausea.  Dispense: 20 tablet; Refill: 0  3. Gastroesophageal reflux disease, esophagitis presence not specified -Take Prilosec as prescribed for on month.- omeprazole (PRILOSEC) 40 MG capsule; Take 2 capsules (80 mg total) by mouth  daily.  Dispense: 30 capsule; Refill: 0 - Please schedule appt with your GI doctor (Dr. Kinnie Scales), so they can manage this medication since you are taking it BID.  Patient expressed understanding and will contact Dr. Jennye Boroughs office for appt.  4. Skin Macule -Please contact Dr. Scharlene Gloss office for appointment to look at macules on right cheek and left upper lip.  Explained to patient that these are common areas for skin cancer.  Patient expressed understanding and will contact Dr. Scharlene Gloss office for appt.  Discussed medication effects and SE's.  Pt agreed to treatment plan. If you are not feeling better in 10-14 days, then please call the office. Please keep your physical appt on 01/25/14.  Duff Pozzi, Lise Auer, PA-C 10:59 AM [facility]

## 2013-12-24 ENCOUNTER — Other Ambulatory Visit: Payer: Self-pay | Admitting: Physician Assistant

## 2013-12-24 ENCOUNTER — Encounter: Payer: Self-pay | Admitting: Physician Assistant

## 2013-12-24 DIAGNOSIS — K219 Gastro-esophageal reflux disease without esophagitis: Secondary | ICD-10-CM

## 2013-12-24 MED ORDER — OMEPRAZOLE 40 MG PO CPDR: 80.0000 mg | DELAYED_RELEASE_CAPSULE | Freq: Every day | ORAL | Status: DC

## 2013-12-26 ENCOUNTER — Other Ambulatory Visit: Payer: Self-pay | Admitting: *Deleted

## 2013-12-26 MED ORDER — LINACLOTIDE 145 MCG PO CAPS
ORAL_CAPSULE | ORAL | Status: DC
Start: 2013-12-26 — End: 2014-04-23

## 2013-12-27 ENCOUNTER — Other Ambulatory Visit: Payer: Self-pay | Admitting: *Deleted

## 2014-01-05 ENCOUNTER — Encounter: Payer: Self-pay | Admitting: Physician Assistant

## 2014-01-05 ENCOUNTER — Encounter: Payer: Self-pay | Admitting: Emergency Medicine

## 2014-01-05 ENCOUNTER — Other Ambulatory Visit: Payer: Self-pay | Admitting: Physician Assistant

## 2014-01-05 DIAGNOSIS — J320 Chronic maxillary sinusitis: Secondary | ICD-10-CM

## 2014-01-05 MED ORDER — LEVOFLOXACIN 500 MG PO TABS: 500.0000 mg | ORAL_TABLET | Freq: Every day | ORAL | Status: AC

## 2014-01-25 ENCOUNTER — Ambulatory Visit (INDEPENDENT_AMBULATORY_CARE_PROVIDER_SITE_OTHER): Payer: 59 | Admitting: Emergency Medicine

## 2014-01-25 ENCOUNTER — Encounter: Payer: Self-pay | Admitting: Emergency Medicine

## 2014-01-25 VITALS — BP 118/82 | HR 88 | Temp 98.4°F | Resp 16 | Ht 61.75 in | Wt 137.0 lb

## 2014-01-25 DIAGNOSIS — Z0000 Encounter for general adult medical examination without abnormal findings: Secondary | ICD-10-CM

## 2014-01-25 DIAGNOSIS — R1013 Epigastric pain: Secondary | ICD-10-CM

## 2014-01-25 DIAGNOSIS — Z111 Encounter for screening for respiratory tuberculosis: Secondary | ICD-10-CM

## 2014-01-25 DIAGNOSIS — J302 Other seasonal allergic rhinitis: Secondary | ICD-10-CM

## 2014-01-25 DIAGNOSIS — R6889 Other general symptoms and signs: Secondary | ICD-10-CM

## 2014-01-25 DIAGNOSIS — I10 Essential (primary) hypertension: Secondary | ICD-10-CM

## 2014-01-25 DIAGNOSIS — Z0001 Encounter for general adult medical examination with abnormal findings: Secondary | ICD-10-CM

## 2014-01-25 DIAGNOSIS — R935 Abnormal findings on diagnostic imaging of other abdominal regions, including retroperitoneum: Secondary | ICD-10-CM

## 2014-01-25 LAB — CBC WITH DIFFERENTIAL/PLATELET
Basophils Absolute: 0 10*3/uL (ref 0.0–0.1)
Basophils Relative: 0 % (ref 0–1)
EOS PCT: 1 % (ref 0–5)
Eosinophils Absolute: 0.1 10*3/uL (ref 0.0–0.7)
HCT: 41 % (ref 36.0–46.0)
Hemoglobin: 14.2 g/dL (ref 12.0–15.0)
Lymphocytes Relative: 39 % (ref 12–46)
Lymphs Abs: 2.7 10*3/uL (ref 0.7–4.0)
MCH: 30.7 pg (ref 26.0–34.0)
MCHC: 34.6 g/dL (ref 30.0–36.0)
MCV: 88.6 fL (ref 78.0–100.0)
MPV: 9.7 fL (ref 8.6–12.4)
Monocytes Absolute: 0.7 10*3/uL (ref 0.1–1.0)
Monocytes Relative: 10 % (ref 3–12)
NEUTROS PCT: 50 % (ref 43–77)
Neutro Abs: 3.5 10*3/uL (ref 1.7–7.7)
PLATELETS: 424 10*3/uL — AB (ref 150–400)
RBC: 4.63 MIL/uL (ref 3.87–5.11)
RDW: 13.5 % (ref 11.5–15.5)
WBC: 7 10*3/uL (ref 4.0–10.5)

## 2014-01-25 LAB — TSH: TSH: 0.032 u[IU]/mL — ABNORMAL LOW (ref 0.350–4.500)

## 2014-01-25 NOTE — Progress Notes (Signed)
Subjective:     Patient ID: Jasmine Hahn, female   DOB: May 04, 1960, 54 y.o.   MRN: [id number]  HPI Comments: 54 yo WF CPE with multiple concerns. She is concerned about new spots on face but has had DERM evaluation, DERM feels LUPUS related patient does not think it is normal LUPUS rash but has f/u in a few weeks to check progress of topical RX. She notes concern is with left upper lip scaling/ redness without itch. Denies trigger/ cause etiology.  Her second concern is recurrent sinusitis/ allergic rhinitis. She saw ENT but was not pleased with result and is considering a second opinion. She notes chronic drainage with and without color and facial pressure on off. She notes Dymista helped in past.   She is also concerned about continued upper abdomen pain. She notes pain is almost daily with/ without food. She does note mild increase in reflux occasionally. She eats bland and takes Prilosec BID.   She is otherwise keeping busy and eating healthy for the most part.  Lab Results      Component                Value               Date                      WBC                      7.2                 12/10/2012                HGB                      13.3                12/10/2012                HCT                      37.6                12/10/2012                PLT                      322                 12/10/2012                GLUCOSE                  74                  12/10/2012                NA                       138                 12/10/2012                K                        3.7  12/10/2012                CL                       103                 12/10/2012                CREATININE               0.68                12/10/2012                BUN                      8                   12/10/2012                CO2                      27                  12/10/2012                TSH                      1.24                08/23/2007                Medication List       This list is accurate as of: 01/25/14 11:59 PM.  Always use your most recent med list.               BIOTIN PO  Take 1 tablet by mouth daily.     CENTRUM SILVER ADULT 50+ PO  Take 1 tablet by mouth every other day.     cetirizine 10 MG tablet  Commonly known as:  ZYRTEC  Take 10 mg by mouth daily.     chlorhexidine 0.12 % solution  Commonly known as:  PERIDEX  Use as directed 15 mLs in the mouth or throat 2 (two) times daily.     FISH OIL PO  Take 1 capsule by mouth daily.     Linaclotide 145 MCG Caps capsule  Commonly known as:  LINZESS  1 capsule daily fo constipation     mometasone 50 MCG/ACT nasal spray  Commonly known as:  NASONEX  Place 2 sprays into the nose daily.     omeprazole 40 MG capsule  Commonly known as:  PRILOSEC  Take 2 capsules (80 mg total) by mouth daily.     pyridoxine 100 MG tablet  Commonly known as:  B-6  Take 100 mg by mouth daily.     SYNTHROID 125 MCG tablet  Generic drug:  levothyroxine  Take 125 mcg by mouth daily before breakfast.     valACYclovir 1000 MG tablet  Commonly known as:  VALTREX  Take 1,000 mg by mouth daily as needed (fever blisters).     VITAMIN B-12 PO  Take 1 tablet by mouth daily.     vitamin C 1000 MG tablet  Take 1,000 mg by mouth daily.     VIVELLE-DOT 0.1 MG/24HR patch  Generic drug:  estradiol  Place 1 patch onto  the skin 2 (two) times a week.     zolpidem 10 MG tablet  Commonly known as:  AMBIEN  Take 10 mg by mouth at bedtime.       Allergies  Allergen Reactions  . Prednisone Other (See Comments)    Aggitation   Past Medical History  Diagnosis Date  . GERD (gastroesophageal reflux disease)   . Discoid lupus   . Thyroid disease     hypo  . Hyperlipidemia   . Allergy    Past Surgical History  Procedure Laterality Date  . Abdominal hysterectomy    . Knee arthroplasty Right     Dr Valentina Gu  . Elbow surgery      Dr Jacqulyn Bath  . Oophorectomy    . Thyroidectomy      x  2 surgeries - partial first time  . Cholecystectomy    . Esophagogastroduodenoscopy  02/2011    Medoff, NEG  . Tubal ligation    . Bone spurs      feet   History  Substance Use Topics  . Smoking status: Former Smoker -- 0.50 packs/day for 30 years    Quit date: 05/21/2011  . Smokeless tobacco: Never Used  . Alcohol Use: No   Family History  Problem Relation Age of Onset  . Stroke Mother   . Hypertension Mother   . Diabetes Mother   . Heart disease Mother   . Hyperlipidemia Mother   . Hypothyroidism Mother   . Cancer Father     lung  . Cancer Brother 59    Colon CA  . Heart disease Maternal Grandmother   . COPD Maternal Grandfather     MAINTENANCE: Colonoscopy: DUE every 3 yearswith polyps and FHX Mammo: [facility] FBD 10/2013 LMP: @ 29 BMD:2015 WNL Pap/ Pelvic: Last WEEK WNL UJW:JXBJY Dentist: Overdue but with dentures  IMMUNIZATIONS: Tdap:06/08/12 Influenza:2015  Patient Care Team: Lucky Cowboy, MD as PCP - General (Internal Medicine) Mertha Finders., MD as Consulting Physician (Dermatology) Griffith Citron, MD as Consulting Physician (Gastroenterology) Turner Daniels, MD as Consulting Physician (Obstetrics and Gynecology)    Review of Systems  Gastrointestinal: Positive for abdominal pain.  Skin: Positive for color change.  All other systems reviewed and are negative.  BP 118/82 mmHg  Pulse 88  Temp(Src) 98.4 F (36.9 C) (Temporal)  Resp 16  Ht 5' 1.75" (1.568 m)  Wt 137 lb (62.143 kg)  BMI 25.28 kg/m2     Objective:   Physical Exam  Constitutional: She is oriented to person, place, and time. She appears well-developed and well-nourished. No distress.  HENT:  Head: Normocephalic and atraumatic.  Right Ear: External ear normal.  Left Ear: External ear normal.  Nose: Nose normal.  Mouth/Throat: Oropharynx is clear and moist.  Cobblestones posterior pharynx   Eyes: Conjunctivae and EOM are normal. Pupils are equal, round, and  reactive to light. Right eye exhibits no discharge. Left eye exhibits no discharge. No scleral icterus.  Neck: Normal range of motion. Neck supple. No JVD present. No tracheal deviation present. No thyromegaly present.  Cardiovascular: Normal rate, regular rhythm, normal heart sounds and intact distal pulses.   Pulmonary/Chest: Effort normal and breath sounds normal.  Abdominal: Soft. Bowel sounds are normal. She exhibits no distension and no mass. There is tenderness.  Minimal epigastric  Genitourinary:  Def gyn  Musculoskeletal: Normal range of motion. She exhibits no edema or tenderness.  Lymphadenopathy:    She has no cervical adenopathy.  Neurological: She  is alert and oriented to person, place, and time. She has normal reflexes. No cranial nerve deficit. She exhibits normal muscle tone. Coordination normal.  Skin: Skin is warm and dry. Rash noted. No erythema. No pallor.  Corner of upper lip on left 1 cm erythema scaling  Psychiatric: She has a normal mood and affect. Her behavior is normal. Judgment and thought content normal.  Nursing note and vitals reviewed.     AORTA questionable abnormal EKG NSCSPT  Assessment:     See plan    Plan:     1.CPE- Update screening labs/ History/ Immunizations/ Testing as needed. Advised healthy diet, QD exercise, increase H20 and continue RX/ Vitamins AD.   2. Abnormal aorta scan- get abdomen u/s to r/o AAA  3. Abdomen pain- check labs if NEG needs recheck at GI  4. Skin change vs lupus progression- keep f/u DERM and continue RX AD    5. Chronic sinusitis vs Allergic rhinitis- Allegra OTC, increase H2o, allergy hygiene explained. Restart Nose spray Nasonex at [facility], if no releif add OTC Flonase in a.m.

## 2014-01-25 NOTE — Patient Instructions (Signed)

## 2014-01-26 LAB — LIPID PANEL
CHOLESTEROL: 174 mg/dL (ref 0–200)
HDL: 40 mg/dL (ref >39)
LDL CALC: 84 mg/dL (ref 0–99)
Total CHOL/HDL Ratio: 4.4 Ratio
Triglycerides: 252 mg/dL — ABNORMAL HIGH (ref <150)
VLDL: 50 mg/dL — ABNORMAL HIGH (ref 0–40)

## 2014-01-26 LAB — HEPATIC FUNCTION PANEL
ALT: 16 U/L (ref 0–35)
AST: 17 U/L (ref 0–37)
Albumin: 4.2 g/dL (ref 3.5–5.2)
Alkaline Phosphatase: 54 U/L (ref 39–117)
Bilirubin, Direct: 0.1 mg/dL (ref 0.0–0.3)
Indirect Bilirubin: 0.2 mg/dL (ref 0.2–1.2)
TOTAL PROTEIN: 6.7 g/dL (ref 6.0–8.3)
Total Bilirubin: 0.3 mg/dL (ref 0.2–1.2)

## 2014-01-26 LAB — VITAMIN D 25 HYDROXY (VIT D DEFICIENCY, FRACTURES): Vit D, 25-Hydroxy: 52 ng/mL (ref 30–100)

## 2014-01-26 LAB — HEMOGLOBIN A1C
Hgb A1c MFr Bld: 5.4 % (ref <5.7)
Mean Plasma Glucose: 108 mg/dL (ref <117)

## 2014-01-26 LAB — INSULIN, FASTING: Insulin fasting, serum: 8.9 u[IU]/mL (ref 2.0–19.6)

## 2014-01-26 LAB — URINALYSIS, ROUTINE W REFLEX MICROSCOPIC
BILIRUBIN URINE: NEGATIVE
GLUCOSE, UA: NEGATIVE mg/dL
Hgb urine dipstick: NEGATIVE
Leukocytes, UA: NEGATIVE
NITRITE: NEGATIVE
PH: 5 (ref 5.0–8.0)
Protein, ur: NEGATIVE mg/dL
Urobilinogen, UA: 1 mg/dL (ref 0.0–1.0)

## 2014-01-26 LAB — BASIC METABOLIC PANEL WITH GFR
BUN: 14 mg/dL (ref 6–23)
CALCIUM: 9 mg/dL (ref 8.4–10.5)
CHLORIDE: 105 meq/L (ref 96–112)
CO2: 26 meq/L (ref 19–32)
Creat: 0.52 mg/dL (ref 0.50–1.10)
GFR, Est Non African American: >89 mL/min
Glucose, Bld: 69 mg/dL — ABNORMAL LOW (ref 70–99)
Potassium: 4.2 mEq/L (ref 3.5–5.3)
Sodium: 138 mEq/L (ref 135–145)

## 2014-01-26 LAB — MAGNESIUM: MAGNESIUM: 2 mg/dL (ref 1.5–2.5)

## 2014-01-27 LAB — TB SKIN TEST
INDURATION: 0 mm
TB SKIN TEST: NEGATIVE

## 2014-02-11 ENCOUNTER — Other Ambulatory Visit: Payer: Self-pay | Admitting: Physician Assistant

## 2014-03-07 ENCOUNTER — Encounter (HOSPITAL_COMMUNITY): Payer: Self-pay | Admitting: *Deleted

## 2014-03-07 ENCOUNTER — Emergency Department (HOSPITAL_COMMUNITY): Payer: 59

## 2014-03-07 ENCOUNTER — Emergency Department (HOSPITAL_COMMUNITY)
Admission: EM | Admit: 2014-03-07 | Discharge: 2014-03-07 | Disposition: A | Discharge status: Home / Self Care | Payer: 59 | Attending: Emergency Medicine | Admitting: Emergency Medicine

## 2014-03-07 DIAGNOSIS — R61 Generalized hyperhidrosis: Secondary | ICD-10-CM | POA: Diagnosis not present

## 2014-03-07 DIAGNOSIS — Z8639 Personal history of other endocrine, nutritional and metabolic disease: Secondary | ICD-10-CM | POA: Diagnosis not present

## 2014-03-07 DIAGNOSIS — R0602 Shortness of breath: Secondary | ICD-10-CM | POA: Diagnosis not present

## 2014-03-07 DIAGNOSIS — Z872 Personal history of diseases of the skin and subcutaneous tissue: Secondary | ICD-10-CM | POA: Insufficient documentation

## 2014-03-07 DIAGNOSIS — K219 Gastro-esophageal reflux disease without esophagitis: Secondary | ICD-10-CM | POA: Diagnosis not present

## 2014-03-07 DIAGNOSIS — R55 Syncope and collapse: Secondary | ICD-10-CM

## 2014-03-07 DIAGNOSIS — Z720 Tobacco use: Secondary | ICD-10-CM | POA: Diagnosis not present

## 2014-03-07 DIAGNOSIS — R079 Chest pain, unspecified: Secondary | ICD-10-CM

## 2014-03-07 DIAGNOSIS — E039 Hypothyroidism, unspecified: Secondary | ICD-10-CM | POA: Diagnosis not present

## 2014-03-07 DIAGNOSIS — Z79899 Other long term (current) drug therapy: Secondary | ICD-10-CM | POA: Insufficient documentation

## 2014-03-07 DIAGNOSIS — Z3202 Encounter for pregnancy test, result negative: Secondary | ICD-10-CM | POA: Insufficient documentation

## 2014-03-07 DIAGNOSIS — R110 Nausea: Secondary | ICD-10-CM | POA: Diagnosis not present

## 2014-03-07 LAB — CBC
HCT: 40.4 % (ref 36.0–46.0)
Hemoglobin: 13.8 g/dL (ref 12.0–15.0)
MCH: 30.6 pg (ref 26.0–34.0)
MCHC: 34.2 g/dL (ref 30.0–36.0)
MCV: 89.6 fL (ref 78.0–100.0)
PLATELETS: 304 10*3/uL (ref 150–400)
RBC: 4.51 MIL/uL (ref 3.87–5.11)
RDW: 13.3 % (ref 11.5–15.5)
WBC: 9 10*3/uL (ref 4.0–10.5)

## 2014-03-07 LAB — I-STAT CHEM 8, ED
BUN: 10 mg/dL (ref 6–23)
CHLORIDE: 104 mmol/L (ref 96–112)
Calcium, Ion: 1.13 mmol/L (ref 1.12–1.23)
Creatinine, Ser: 0.6 mg/dL (ref 0.50–1.10)
Glucose, Bld: 87 mg/dL (ref 70–99)
HCT: 44 % (ref 36.0–46.0)
Hemoglobin: 15 g/dL (ref 12.0–15.0)
Potassium: 3.6 mmol/L (ref 3.5–5.1)
SODIUM: 141 mmol/L (ref 135–145)
TCO2: 21 mmol/L (ref 0–100)

## 2014-03-07 LAB — POC URINE PREG, ED: PREG TEST UR: NEGATIVE

## 2014-03-07 LAB — I-STAT TROPONIN, ED: TROPONIN I, POC: 0 ng/mL (ref 0.00–0.08)

## 2014-03-07 MED ORDER — ASPIRIN 81 MG PO CHEW: 324.0000 mg | CHEWABLE_TABLET | Freq: Once | ORAL | Status: DC

## 2014-03-07 MED ORDER — IOHEXOL 350 MG/ML SOLN
100.0000 mL | Freq: Once | INTRAVENOUS | Status: AC | PRN
  Administered 2014-03-07: 100 mL via INTRAVENOUS

## 2014-03-07 NOTE — Discharge Instructions (Signed)

## 2014-03-07 NOTE — ED Provider Notes (Signed)
CSN: [id number]     Arrival date & time 03/07/14  1017 History   First MD Initiated Contact with Patient 03/07/14 1027     Chief Complaint  Patient presents with  . Chest Pain  . Loss of Consciousness     (Consider location/radiation/quality/duration/timing/severity/associated sxs/prior Treatment) HPI   54 yo F with PMHx of IBS, GERD, HLD, and reported SLE, though not on any medications, as well as recurrent syncopal episodes s/p negative work-up at OSH who presents following syncopal episode 4 days ago. Per her report, she had acute onset of sharp, transient, substernal CP followed by lightheadedness, transient blurred vision, and syncope. This occurred twice and pt states she was out "for an hour," though unable to recall the time. This is similar to events that have occurred "for a while," although she states she normally presents to [facility] for these sx. She states that she felt fine when she woke up 3 days ago and subsequently did not present to the ED or her PCP. She googled her symptoms earlier this AM and found that mitral valve prolapse could lead to her sx, so she decided to present for evaluation. Her mother has a reported h/o MVP but also CAD and CHF. She denies any CP since the episode. No further syncopal episodes. No current HA, blurred vision, or other sx.  Past Medical History  Diagnosis Date  . GERD (gastroesophageal reflux disease)   . Discoid lupus   . Thyroid disease     hypo  . Hyperlipidemia   . Allergy    Past Surgical History  Procedure Laterality Date  . Abdominal hysterectomy    . Knee arthroplasty Right     Dr Lorre Nick  . Elbow surgery      Dr Laverta Baltimore  . Oophorectomy    . Thyroidectomy      x 2 surgeries - partial first time  . Cholecystectomy    . Esophagogastroduodenoscopy  02/2011    Medoff, NEG  . Tubal ligation    . Bone spurs      feet   Family History  Problem Relation Age of Onset  . Stroke Mother   . Hypertension Mother   . Diabetes  Mother   . Heart disease Mother   . Hyperlipidemia Mother   . Hypothyroidism Mother   . Cancer Father     lung  . Cancer Brother 82    Colon CA  . Heart disease Maternal Grandmother   . COPD Maternal Grandfather    History  Substance Use Topics  . Smoking status: Current Every Day Smoker -- 0.50 packs/day for 30 years    Last Attempt to Quit: 05/21/2011  . Smokeless tobacco: Never Used  . Alcohol Use: No   OB History    No data available     Review of Systems  Constitutional: Positive for diaphoresis. Negative for fever and chills.  HENT: Negative for congestion, rhinorrhea and sore throat.   Eyes: Negative for visual disturbance.  Respiratory: Positive for shortness of breath. Negative for wheezing.   Cardiovascular: Positive for chest pain.  Gastrointestinal: Positive for nausea. Negative for vomiting, abdominal pain and diarrhea.  Genitourinary: Negative for dysuria and flank pain.  Musculoskeletal: Negative for neck pain and neck stiffness.  Skin: Negative for rash.  Allergic/Immunologic: Negative for immunocompromised state.  Neurological: Positive for syncope. Negative for dizziness, weakness and headaches.      Allergies  Prednisone  Home Medications   Prior to Admission medications   Medication  Sig Start Date End Date Taking? Authorizing Provider  Ascorbic Acid (VITAMIN C) 1000 MG tablet Take 1,000 mg by mouth daily.   Yes Historical Provider, MD  BIOTIN PO Take 1 tablet by mouth daily.   Yes Historical Provider, MD  bisacodyl (DULCOLAX) 5 MG EC tablet Take 10 mg by mouth at bedtime as needed for moderate constipation.   Yes Historical Provider, MD  cetirizine (ZYRTEC) 10 MG tablet Take 10 mg by mouth daily.   Yes Historical Provider, MD  Cyanocobalamin (VITAMIN B-12 PO) Take 1 tablet by mouth daily.   Yes Historical Provider, MD  levothyroxine (SYNTHROID) 125 MCG tablet Take 125 mcg by mouth daily before breakfast.   Yes Historical Provider, MD  Linaclotide  (LINZESS) 145 MCG CAPS capsule 1 capsule daily fo constipation Patient taking differently: Take 145 mcg by mouth daily.  12/26/13  Yes Unk Pinto, MD  Multiple Vitamins-Minerals (CENTRUM SILVER ADULT 50+ PO) Take 1 tablet by mouth daily.    Yes Historical Provider, MD  Omega-3 Fatty Acids (FISH OIL PO) Take 1 capsule by mouth daily.   Yes Historical Provider, MD  omeprazole (PRILOSEC) 40 MG capsule Take 80 mg by mouth daily.   Yes Historical Provider, MD  VIVELLE-DOT 0.1 MG/24HR patch Place 1 patch onto the skin 2 (two) times a week.  12/07/12  Yes Historical Provider, MD  zolpidem (AMBIEN) 10 MG tablet Take 10 mg by mouth at bedtime.  11/05/12  Yes Historical Provider, MD  chlorhexidine (PERIDEX) 0.12 % solution Use as directed 15 mLs in the mouth or throat 2 (two) times daily. Patient not taking: Reported on 03/07/2014 10/05/13   Melissa R Klausner, PA-C  mometasone (NASONEX) 50 MCG/ACT nasal spray Place 2 sprays into the nose daily. Patient not taking: Reported on 03/07/2014 12/21/13   Anderson Malta L Couillard, PA-C  omeprazole (PRILOSEC) 40 MG capsule TAKE 2 CAPSULES EVERY DAY Patient not taking: Reported on 03/07/2014 02/11/14   Unk Pinto, MD  pyridoxine (B-6) 100 MG tablet Take 100 mg by mouth daily.    Historical Provider, MD  valACYclovir (VALTREX) 1000 MG tablet Take 1,000 mg by mouth daily as needed (fever blisters).  09/13/12   Historical Provider, MD   BP 108/64 mmHg  Pulse 82  Resp 23  SpO2 97% Physical Exam  Constitutional: She is oriented to person, place, and time. She appears well-developed and well-nourished. No distress.  HENT:  Head: Normocephalic and atraumatic.  Mouth/Throat: No oropharyngeal exudate.  Eyes: Conjunctivae are normal. Pupils are equal, round, and reactive to light.  Neck: Normal range of motion. Neck supple. No JVD present.  Cardiovascular: Normal rate and normal heart sounds.  Exam reveals no friction rub.   No murmur heard. Pulmonary/Chest: Effort  normal and breath sounds normal. No respiratory distress. She has no wheezes. She has no rales.  Abdominal: Soft. She exhibits no distension. There is no tenderness.  Musculoskeletal: She exhibits no edema.  Neurological: She is alert and oriented to person, place, and time.  Skin: Skin is warm. No rash noted.  Nursing note and vitals reviewed.   ED Course  Procedures (including critical care time) Labs Review Labs Reviewed  CBC  I-STAT CHEM 8, ED  I-STAT TROPOININ, ED  POC URINE PREG, ED    Imaging Review Ct Angio Chest Pe W/cm &/or Wo Cm  03/07/2014   CLINICAL DATA:  Syncopal episode x 2 03/03/14, severe chest pains intermittent x 4 days, chest pressure w/ sob  EXAM: CT ANGIOGRAPHY CHEST WITH CONTRAST  TECHNIQUE: Multidetector CT imaging of the chest was performed using the standard protocol during bolus administration of intravenous contrast. Multiplanar CT image reconstructions and MIPs were obtained to evaluate the vascular anatomy.  CONTRAST:  137mL OMNIPAQUE IOHEXOL 350 MG/ML SOLN  COMPARISON:  None.  FINDINGS: The SVC is patent. Right atrium is nondilated. RV/LV ratio normal. Central pulmonary arteries are nondilated. Satisfactory opacification of pulmonary arteries noted, and there is no evidence of pulmonary emboli. Patent superior and inferior pulmonary veins bilaterally. Adequate contrast opacification of the thoracic aorta with no evidence of dissection, aneurysm, or stenosis. There is classic 3-vessel brachiocephalic arch anatomy without proximal stenosis.  No pleural or pericardial effusion. Sub cm prevascular and pretracheal lymph nodes. No hilar adenopathy.  Mild dependent atelectasis posteriorly in both lungs. No focal infiltrate or nodule. Minimal spurring in the mid and lower thoracic spine. Sternum intact. Surgical clips from thyroidectomy and cholecystectomy. Remainder visualized upper abdomen unremarkable.  Review of the MIP images confirms the above findings.  IMPRESSION:  1. Negative.  No acute PE or thoracic aortic dissection.   Electronically Signed   By: Lucrezia Europe M.D.   On: 03/07/2014 13:03     EKG Interpretation   Date/Time:  Tuesday March 07 2014 10:21:11 EST Ventricular Rate:  94 PR Interval:  136 QRS Duration: 82 QT Interval:  346 QTC Calculation: 432 R Axis:   88 Text Interpretation:  Normal sinus rhythm Normal ECG Nonspecific T wave  abnormality Confirmed by WARD,  DO, KRISTEN ([id number]) on 03/07/2014 10:37:00  AM      MDM   Final diagnoses:  Chest pain, unspecified chest pain type  Atypical syncope    54 yo F with PMHx of IBS, GERD, HLD< and reported SLE, though not on any medications, as well as recurrent syncopal episodes s/p negative work-up at OSH who presents following syncopal episode 4 days ago. See HPI above. On arrival, pt AF, HR 101 (improved to 90s on beeding), RR 13, BP 124/85, satting 96% on RA. Exam as above, pt overall well-appearing and in NAD.  Pt's presentation is most c/w likely vasovagal versus orthostatic syncope given preceding symptoms followed by syncope that self-resolves upon falling or lying flat. Pt has long h/o similar complaints per her reports with negative work-ups. No h/o heart disease and pt denies any CP currently; however, will send troponin for evaluation of possible ACS component though unlikely. DDx includes possible transient arrhythmia, though EKG on arrival shows no evidence of long QT, WPW, or arrhythmogenic abnormality. Will check lytes, basic labs for evaluation of anemia or alternative metabolic etiology. Pt denies any HA, with non-focal neuro exam at this time and do not suspect CVA, seizure, or intracranial abnormality. Will f/u lab. Of note, pt does reportedly have lupus but no h/o clots or complications. Will plan for CTA PE to eval for PE given syncope and CP.   CBC with no leukocytosis or anemia. Chem panel unremarkable and troponin negative/undetectable, reassuring given onset of sx 3-4  days ago. UPT neg. Awaiting CTA PE.  CTA PE negative for dissection, PE, or effusion. VS remain stable. Orthostatics WNL. Given negative lab and imaging, believe pt can be d/c'ed safely with outpt Cardiology follow-up for Holter monitoring. No murmurs on exam to suggest symptomatic valvular disease and CT unremarkable. Will d/c with return precautions.  Clinical Impression: 1. Chest pain, unspecified chest pain type   2. Atypical syncope     Disposition: Discharge  Condition: Good  I have discussed the results, Dx  and Tx plan with the pt(& family if present). He/she/they expressed understanding and agree(s) with the plan. Discharge instructions discussed at great length. Strict return precautions discussed and pt &/or family have verbalized understanding of the instructions. No further questions at time of discharge.    Discharge Medication List as of 03/07/2014  1:18 PM      Follow Up: [facility] [address], [place] [phone]  Call to make next available appointment for possible Holter monitoring and discussion of echocardiogram  Unk Pinto, MD [address] [place]  [phone]  In 3 days    Pt seen in conjunction with Dr. Cindi Carbon, MD 03/07/14 2029  Duffy Bruce, MD 03/07/14 2030  [place], DO 03/08/14 [phone]

## 2014-03-07 NOTE — ED Notes (Signed)
Patient transported to CT 

## 2014-03-07 NOTE — ED Provider Notes (Signed)
I saw and evaluated the patient, reviewed the resident's note and I agree with the findings and plan.   EKG Interpretation   Date/Time:  Tuesday March 07 2014 10:21:11 EST Ventricular Rate:  94 PR Interval:  136 QRS Duration: 82 QT Interval:  346 QTC Calculation: 432 R Axis:   88 Text Interpretation:  Normal sinus rhythm Normal ECG Nonspecific T wave  abnormality Confirmed by Domingue Coltrain,  DO, Khyran Riera ([id number]) on 03/07/2014 10:37:00  AM      Pt is a 54 y.o. F with h/o IBS, HLD, lupus not on medication who presents to the emergency department with an episode of chest pressure, shortness of breath and syncopal event that occurred 4 days ago. A symptom and currently. Neurologically intact. Hemodynamically stable. States she's had these episodes before and has been evaluated at [facility].  States she has never been admitted and has been offered admission the past has declined. No prior history of stress test or cardiac catheterization. No known history of CAD. On exam, heart lung sounds normal, abdomen soft nontender, no calf tenderness or swelling, neurologically intact. Labs unremarkable with negative troponin and normal electrolytes. Urine pregnancy negative. EKG shows nonspecific T-wave changes with no other abnormality. CT chest shows no pulmonary embolus or dissection. No infiltrate or edema. We'll discharge home with outpatient cardiology follow-up. Discussed return precautions.  Osgood, DO 03/07/14 [phone]

## 2014-03-07 NOTE — ED Notes (Signed)
Pt presents via POV c/o centralized chest pressure and an  unwitnessed syncopal epidsode Saturday morning. Pt reports she has had these episodes before. Pt reports passing out for an hour and hitting her head, & shoulder on a door.  Pt reports blurred vision when these episodes happen. Pt also reports weakness and SOB, and also right neck and shoulder pain. Pt alert x 4.

## 2014-03-08 ENCOUNTER — Encounter: Payer: Self-pay | Admitting: Internal Medicine

## 2014-03-08 ENCOUNTER — Other Ambulatory Visit: Payer: Self-pay

## 2014-03-08 ENCOUNTER — Other Ambulatory Visit: Payer: Self-pay | Admitting: *Deleted

## 2014-03-08 MED ORDER — LEVOTHYROXINE SODIUM 88 MCG PO TABS: 88.0000 ug | ORAL_TABLET | Freq: Every day | ORAL | Status: DC

## 2014-03-10 ENCOUNTER — Encounter: Payer: Self-pay | Admitting: Internal Medicine

## 2014-03-10 ENCOUNTER — Ambulatory Visit (INDEPENDENT_AMBULATORY_CARE_PROVIDER_SITE_OTHER): Payer: 59 | Admitting: Internal Medicine

## 2014-03-10 ENCOUNTER — Encounter: Payer: Self-pay | Admitting: *Deleted

## 2014-03-10 VITALS — HR 82 | Ht 61.0 in | Wt 136.8 lb

## 2014-03-10 DIAGNOSIS — R55 Syncope and collapse: Secondary | ICD-10-CM

## 2014-03-10 NOTE — Patient Instructions (Addendum)
Your physician recommends that you continue on your current medications as directed. Please refer to the Current Medication list given to you today.  Your physician has requested that you have an echocardiogram. Echocardiography is a painless test that uses sound waves to create images of your heart. It provides your doctor with information about the size and shape of your heart and how well your heart's chambers and valves are working. This procedure takes approximately one hour. There are no restrictions for this procedure.  Your physician has requested that you have an exercise tolerance test. For further information please visit [email or web address]. Please also follow instruction sheet, as given.  Your physician recommends that you schedule a follow-up appointment in: 3 months with Dr. Caryl Comes.

## 2014-03-10 NOTE — Progress Notes (Signed)
ELECTROPHYSIOLOGY CONSULT NOTE  Patient ID: Jasmine Hahn, MRN: [id number], DOB/AGE: [birth year] 56 y.o. Admit date: (Not on file) Date of Consult: 03/10/2014  Primary Physician: Nadean Corwin, MD Primary Cardiologist: new  Chief Complaint: syncope   HPI Jasmine Hahn is a 54 y.o. female  Referred for recurrent syncope occurring over the last 1-1/2 years or so.  These episodes have occurred all upon being awakened in the middle of the night with sharp stabbing pains accompanied by tachypalpitations. They've been characterized by prodrome of blurry vision nausea and profound diaphoresis. She has on a couple of occasions gone to the commode and passed out following the commode. Most recently the episode was associated with a recurrent episode of syncope following standing after awakening from the first. The recovery phase has been characterized by residual orthostatic intolerance profound diaphoresis and residual fatigue noted the episodes have been witnessed and so there is no comment regarding pallor.  EMS is been called on 2 occasions. On both the blood pressure was reported as low. On one the heart rate is also recorded as low. She is a known history of severe GE reflux disease. She sees Dr. Kinnie Scales for this. She has no known structural heart disease. Her past medical history is notable for discoid and treated hypothyroidism    Past Medical History  Diagnosis Date  . GERD (gastroesophageal reflux disease)   . Discoid lupus   . Thyroid disease     hypo  . Hyperlipidemia   . Allergy       Surgical History:  Past Surgical History  Procedure Laterality Date  . Abdominal hysterectomy    . Knee arthroplasty Right     Dr Valentina Gu  . Elbow surgery      Dr Jacqulyn Bath  . Oophorectomy    . Thyroidectomy      x 2 surgeries - partial first time  . Cholecystectomy    . Esophagogastroduodenoscopy  02/2011    Medoff, NEG  . Tubal ligation    . Bone spurs      feet     Home  Meds: Prior to Admission medications   Medication Sig Start Date End Date Taking? Authorizing Provider  Ascorbic Acid (VITAMIN C) 1000 MG tablet Take 1,000 mg by mouth daily.   Yes Historical Provider, MD  BIOTIN PO Take 1 tablet by mouth daily.   Yes Historical Provider, MD  bisacodyl (DULCOLAX) 5 MG EC tablet Take 10 mg by mouth at bedtime as needed for moderate constipation.   Yes Historical Provider, MD  cetirizine (ZYRTEC) 10 MG tablet Take 10 mg by mouth daily.   Yes Historical Provider, MD  chlorhexidine (PERIDEX) 0.12 % solution Use as directed 15 mLs in the mouth or throat 2 (two) times daily. 10/05/13  Yes Melissa R Graber, PA-C  Cyanocobalamin (VITAMIN B-12 PO) Take 1 tablet by mouth daily.   Yes Historical Provider, MD  fluticasone (CUTIVATE) 0.05 % cream Apply 1 application topically as needed. Spot on face 12/31/13  Yes Historical Provider, MD  levothyroxine (SYNTHROID) 88 MCG tablet Take 1 tablet (88 mcg total) by mouth daily. BRAND NAME SYNTHROID 03/08/14 03/08/15 Yes Lucky Cowboy, MD  Linaclotide [facility] 145 MCG CAPS capsule 1 capsule daily fo constipation Patient taking differently: Take 145 mcg by mouth daily.  12/26/13  Yes Lucky Cowboy, MD  mometasone (NASONEX) 50 MCG/ACT nasal spray Place 2 sprays into the nose daily. 12/21/13  Yes Jennifer L Couillard, PA-C  Multiple Vitamins-Minerals (CENTRUM SILVER ADULT 50+ PO) Take  1 tablet by mouth daily.    Yes Historical Provider, MD  Omega-3 Fatty Acids (FISH OIL PO) Take 1 capsule by mouth daily.   Yes Historical Provider, MD  omeprazole (PRILOSEC) 40 MG capsule TAKE 2 CAPSULES EVERY DAY 02/11/14  Yes Lucky Cowboy, MD  ondansetron [facility]) 4 MG tablet  12/21/13  Yes Historical Provider, MD  valACYclovir (VALTREX) 1000 MG tablet Take 1,000 mg by mouth daily as needed (fever blisters).  09/13/12  Yes Historical Provider, MD  VIVELLE-DOT 0.1 MG/24HR patch Place 1 patch onto the skin 2 (two) times a week.  12/07/12  Yes Historical  Provider, MD  zolpidem (AMBIEN) 10 MG tablet Take 10 mg by mouth at bedtime.  11/05/12  Yes Historical Provider, MD     Allergies:  Allergies  Allergen Reactions  . Prednisone Other (See Comments)    Aggitation    History   Social History  . Marital Status: Single    Spouse Name: N/A  . Number of Children: N/A  . Years of Education: N/A   Occupational History  . Not on file.   Social History Main Topics  . Smoking status: Current Every Day Smoker -- 0.50 packs/day for 30 years    Last Attempt to Quit: 05/21/2011  . Smokeless tobacco: Never Used  . Alcohol Use: No  . Drug Use: No  . Sexual Activity: Not on file   Other Topics Concern  . Not on file   Social History Narrative     Family History  Problem Relation Age of Onset  . Stroke Mother   . Hypertension Mother   . Diabetes Mother   . Heart disease Mother   . Hyperlipidemia Mother   . Hypothyroidism Mother   . Cancer Father     lung  . Cancer Brother 93    Colon CA  . Heart disease Maternal Grandmother   . COPD Maternal Grandfather      ROS:  Please see the history of present illness.     All other systems reviewed and negative.    Physical Exam   Pulse 82, height 5\' 1"  (1.549 m), weight 136 lb 12.8 oz (62.052 kg). General: Well developed, well nourished female in no acute distress. Head: Normocephalic, atraumatic, sclera non-icteric, no xanthomas, nares are without discharge. EENT: normal Lymph Nodes:  none Back: without scoliosis/kyphosi , no CVA tendersness Neck: Negative for carotid bruits. JVD not elevated. Lungs: Clear bilaterally to auscultation without wheezes, rales, or rhonchi. Breathing is unlabored. Heart: RRR with S1 S2. N  murmur , rubs, or gallops appreciated. Abdomen: Soft, non-tender, non-distended with normoactive bowel sounds. No hepatomegaly. No rebound/guarding. No obvious abdominal masses. Msk:  Strength and tone appear normal for age. Extremities: No clubbing or cyanosis. No*  edema.  Distal pedal pulses are 2+ and equal bilaterally. Skin: Warm and Dry Neuro: Alert and oriented X 3. CN III-XII intact Grossly normal sensory and motor function . Psych:  Responds to questions appropriately with a normal affect.      Labs: Cardiac Enzymes No results for input(s): CKTOTAL, CKMB, TROPONINI in the last 72 hours. CBC Lab Results  Component Value Date   WBC 9.0 03/07/2014   HGB 15.0 03/07/2014   HCT 44.0 03/07/2014   MCV 89.6 03/07/2014   PLT 304 03/07/2014   PROTIME: No results for input(s): LABPROT, INR in the last 72 hours. Chemistry  Recent Labs Lab 03/07/14 1117  NA 141  K 3.6  CL 104  BUN 10  CREATININE  0.60  GLUCOSE 87   Lipids Lab Results  Component Value Date   CHOL 174 01/25/2014   HDL 40 01/25/2014   LDLCALC 84 01/25/2014   TRIG 252* 01/25/2014   BNP No results found for: PROBNP Miscellaneous Lab Results  Component Value Date   DDIMER <0.27 12/10/2012    Radiology/Studies:  Ct Angio Chest Pe W/cm &/or Wo Cm  03/07/2014   CLINICAL DATA:  Syncopal episode x 2 03/03/14, severe chest pains intermittent x 4 days, chest pressure w/ sob  EXAM: CT ANGIOGRAPHY CHEST WITH CONTRAST  TECHNIQUE: Multidetector CT imaging of the chest was performed using the standard protocol during bolus administration of intravenous contrast. Multiplanar CT image reconstructions and MIPs were obtained to evaluate the vascular anatomy.  CONTRAST:  OMNIPAQUE IOHEXOL 350 MG/ML SOLN  COMPARISON:  None.  FINDINGS: The SVC is patent. Right atrium is nondilated. RV/LV ratio normal. Central pulmonary arteries are nondilated. Satisfactory opacification of pulmonary arteries noted, and there is no evidence of pulmonary emboli. Patent superior and inferior pulmonary veins bilaterally. Adequate contrast opacification of the thoracic aorta with no evidence of dissection, aneurysm, or stenosis. There is classic 3-vessel brachiocephalic arch anatomy without proximal stenosis.   No pleural or pericardial effusion. Sub cm prevascular and pretracheal lymph nodes. No hilar adenopathy.  Mild dependent atelectasis posteriorly in both lungs. No focal infiltrate or nodule. Minimal spurring in the mid and lower thoracic spine. Sternum intact. Surgical clips from thyroidectomy and cholecystectomy. Remainder visualized upper abdomen unremarkable.  Review of the MIP images confirms the above findings.  IMPRESSION: 1. Negative.  No acute PE or thoracic aortic dissection.   Electronically Signed   By: Corlis Leak M.D.   On: 03/07/2014 13:03    EKG:  NSR 82  15/08/36   Assessment and Plan: Syncope  Dypsnea on exertion  GERD  Psychosocial Stress.  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  We have discussed the potential relationship between neurocardiogenic syncope and exposure to [facility] which is frequently temporally related which is not the case here   We will obtain an echocardiogram to make sure that it is normal.Her dyspnea on exertion, is possibly but unlikely related to coronary disease which is a concern given her family history. Her ECG being normal, we will submit her for standard treadmill testing. We do however need to exclude structural heart disease  The palpitations that she feels accompanying this are likely related to sympathetic discharge; there is some possibility that they are the trigger but as a follow the epigastric pain I suspect that they are secondary.  She is to follow-up with Dr. Kinnie Scales next week or 2. We will work on medical therapy for the reflux which I think is the likely trigger by way of pain. I could see it has been using some patient is to prevent vagal withdrawal in the context of abdominal pain triggers. I will review this with Dr. Kinnie Scales.  In addition, I have stressed with her the importance of addressing stress/anxiety as  a co-occurring event as it can increase susceptibility She will look into this.\    She is encouraged not to smoke.      Sherryl Manges

## 2014-03-13 ENCOUNTER — Encounter: Payer: Self-pay | Admitting: Internal Medicine

## 2014-03-15 ENCOUNTER — Ambulatory Visit (HOSPITAL_COMMUNITY): Payer: 59 | Attending: Cardiology | Admitting: Radiology

## 2014-03-15 DIAGNOSIS — R55 Syncope and collapse: Secondary | ICD-10-CM | POA: Insufficient documentation

## 2014-03-15 DIAGNOSIS — Z720 Tobacco use: Secondary | ICD-10-CM | POA: Insufficient documentation

## 2014-03-15 NOTE — Progress Notes (Signed)
Echocardiogram performed.  

## 2014-03-15 NOTE — Telephone Encounter (Signed)
Called patient and left message explaining Dr. Caryl Comes was not in the office this week to address this.

## 2014-03-18 ENCOUNTER — Encounter: Payer: Self-pay | Admitting: *Deleted

## 2014-03-22 ENCOUNTER — Encounter: Payer: Self-pay | Admitting: Internal Medicine

## 2014-03-30 ENCOUNTER — Other Ambulatory Visit: Payer: Self-pay | Admitting: Physician Assistant

## 2014-03-31 ENCOUNTER — Telehealth: Payer: Self-pay | Admitting: *Deleted

## 2014-03-31 NOTE — Telephone Encounter (Signed)
Called patient to discuss her email complaint from yesterday.  Apologized for not calling her.  Explained that it was my fault as I did not see she was a new patient. Further explained that I would have normally called a new patient with these results and not sent it through [place]. I told her that I would be happy to answer any questions she may have.  She was only interested in mitral valve results. We discussed valve findings - which gave her relief to hear it was a normal valve. Also reviewed importance of having the stress test and reasoning behind having this test complete (family hx coronary dz) and she stated that SOB has resolved. She is fine with my apology and not calling her, but she does not feel like she needs follow up at this time.  She states she thinks most of this was related to GI issues, which she is seeing a doctor for now. She was very pleasant and stated she will call office if she feels she needs to be seen again. Cancelling future appts per her request.

## 2014-04-03 ENCOUNTER — Encounter: Payer: Self-pay | Admitting: Emergency Medicine

## 2014-04-03 MED ORDER — OMEPRAZOLE 40 MG PO CPDR: 40.0000 mg | DELAYED_RELEASE_CAPSULE | Freq: Every day | ORAL | Status: DC

## 2014-04-03 MED ORDER — RANITIDINE HCL 300 MG PO TABS: ORAL_TABLET | ORAL | Status: DC

## 2014-04-18 ENCOUNTER — Encounter: Payer: 59 | Admitting: Nurse Practitioner

## 2014-04-18 ENCOUNTER — Encounter: Payer: Self-pay | Admitting: Internal Medicine

## 2014-04-23 ENCOUNTER — Other Ambulatory Visit: Payer: Self-pay | Admitting: Internal Medicine

## 2014-05-01 ENCOUNTER — Encounter: Payer: Self-pay | Admitting: Emergency Medicine

## 2014-05-01 ENCOUNTER — Other Ambulatory Visit: Payer: Self-pay

## 2014-05-01 DIAGNOSIS — J320 Chronic maxillary sinusitis: Secondary | ICD-10-CM

## 2014-05-01 MED ORDER — MOMETASONE FUROATE 50 MCG/ACT NA SUSP: 2.0000 | Freq: Every day | NASAL | Status: DC

## 2014-05-15 ENCOUNTER — Other Ambulatory Visit: Payer: Self-pay | Admitting: Physician Assistant

## 2014-05-22 ENCOUNTER — Encounter (HOSPITAL_BASED_OUTPATIENT_CLINIC_OR_DEPARTMENT_OTHER): Payer: Self-pay | Admitting: *Deleted

## 2014-05-22 NOTE — Progress Notes (Signed)
No labs needed Did have syncopy worked up-no known cause yet-cleared for surgery

## 2014-05-24 NOTE — H&P (Signed)
Jasmine Hahn/[facility] [address]   [address] [place], [place] [address] [phone] A Division of [facility]  Jasmine Hahn, M.D.   Jasmine Hahn Hole, M.D.   Jasmine Hahn, M.D.   Jasmine Hahn, M.D.   Jasmine Hahn, M.D Jasmine Hahn. Jasmine Hahn, M.D.  Jasmine Hahn, M.D.  Jasmine Hahn, M.D.    Jasmine Hahn, M.D. Jasmine Dane, PA- C  Jasmine L. Dub Mikes, PA-C  Jasmine A. Shepperson, PA-C  Jasmine [place], PA-C  [place], [place]   RE: [name], [name]                                [phone]      DOB: Jun 12, 1960 PROGRESS NOTE: 05-02-14 Jasmine Hahn is a new patient to the office.  Presents for a new acute injury to her right knee.  She was dancing on carpeting, she caught her foot, twisted her knee and felt and heard a pop medially.  Significant swelling which has settled down.  Continues to have marked mechanical symptoms medially.  This happened five weeks ago.  She has given this time and rest.  Wearing a brace is a little bit better.  She is still limping.  She feels like this is going to lock if she twists or goes into flexion.   Remaining history is reviewed, updated and included in the chart.  She is being followed by Dr. Amador Cunas. She is currently being worked up for some syncopal episodes and I think a Holter monitor is planned.  She has no cardiac symptoms otherwise. She does work Restaurant manager, fast food.   History of a right knee arthroscopy by Dr. Sherlean Foot 6 or 7 years ago.  By her description this sounds like a synovectomy and chondroplasty, not meniscus work.  She improved after that, recovered, rehabbed and I had no issues until this new acute event.    EXAMINATION: General exam is outlined and included in the chart.  Specifically, healthy appearing 54 year-old.  Height: 5?1.  Weight: 138 pounds.  Markedly antalgic gait on the right.  Right knee point tender medial joint li ne.  Positive McMurray's.  Trace effusion.  Fairly good  motion.  Stable ligaments.  I am not getting a lot of crepitus.  Neurovascularly intact distally.  Opposite left knee has no joint line tenderness.  No swelling.    X-RAYS: Four view standing x-ray shows nothing acute, but it does look like she has a small benign subchondral cyst 5-6 mm in the weight bearing dome medial femoral condyle.  There is no obvious fracture adjacent to this and the joint subchondral bone appears to be intact.    DISPOSITION:  Medial meniscus tear, right knee.  I am a little concerned about this underlying cyst in the medial femoral condyle.  MRI to delineate pathology.  She will call me when that is complete.  If the cyst is benign, not communicating and she has a meniscus tear, we have talked about proceeding with straightforward arthroscopy for her meniscus.  That procedure, risks, benefits and complications reviewed.  Paperwork complete.  All questions answered.  Again, final decision after we talk when we see her scan.  Careful in the interim.    Jasmine Hahn, M.D.   Electronically verified by Jasmine Hahn, M.D. DFM:jjh D 05-03-14  Jasmine Hahn/[facility] [address]   [address] [place], [place] [address] [phone]  [phone] A Division of [facility]  Jasmine Hahn, M.D.   Jasmine Hahn Hole, M.D.   Jasmine Hahn, M.D.   Jasmine Hahn, M.D.   Jasmine Hahn, M.D Jasmine Hahn. Jasmine Hahn, M.D.  Jasmine Hahn, M.D.  Jasmine Hahn, M.D.    Jasmine Hahn, M.D. Jasmine Dane, PA- C  Jasmine L. Dub Mikes, PA-C  Jasmine A. Shepperson, PA-C  Jasmine [place], PA-C  [place], [place]   RE: Jasmine, Hahn                                [phone]      DOB: [birth year] PROGRESS NOTE: 05-09-14 I had Jasmine Hahn come in to go over her MRI and plans.  As expected, this did show a medial meniscus tear.  It also shows an area of full thickness chondral loss posterior aspect of her femoral condyle.  There appears to be a geode  in the bone below this.  No symptoms before this acute injury.  I went over the scan, the report and even showed it to her.  My plan is exam under anesthesia arthroscopy, taking care of her meniscus tear.  I will then look at this area of the posterior aspect of the femoral condyle. I have talked to her about simple debridement, decompression and possible microfracture.  If this turns out to be a relatively large area, greater than 10 mm in diameter, I will use a Cartiform graft.  That procedure, risks, benefits and complications discussed in detail.  Recovery and rehab outlined.  Again, I am only going to go in that direction if the lesion on the condyle warrants this.  Paperwork complete. All questions answered.  More than 15 minutes spent face-to-face covering this with her.     Jasmine Hahn, M.D.   Electronically verified by Jasmine Hahn, M.D. DFM:jjh D 05-09-14 T 05-10-14 T 05-04-14

## 2014-05-25 ENCOUNTER — Encounter (HOSPITAL_BASED_OUTPATIENT_CLINIC_OR_DEPARTMENT_OTHER): Payer: Self-pay | Admitting: Anesthesiology

## 2014-05-25 ENCOUNTER — Encounter (HOSPITAL_BASED_OUTPATIENT_CLINIC_OR_DEPARTMENT_OTHER)
Admission: RE | Disposition: A | Discharge status: Home / Self Care | Payer: Self-pay | Source: Ambulatory Visit | Attending: Orthopedic Surgery

## 2014-05-25 ENCOUNTER — Ambulatory Visit (HOSPITAL_BASED_OUTPATIENT_CLINIC_OR_DEPARTMENT_OTHER)
Admission: RE | Admit: 2014-05-25 | Discharge: 2014-05-25 | Disposition: A | Discharge status: Home / Self Care | Payer: 59 | Source: Ambulatory Visit | Attending: Orthopedic Surgery | Admitting: Orthopedic Surgery

## 2014-05-25 ENCOUNTER — Ambulatory Visit (HOSPITAL_BASED_OUTPATIENT_CLINIC_OR_DEPARTMENT_OTHER): Payer: 59 | Admitting: Anesthesiology

## 2014-05-25 DIAGNOSIS — Y9389 Activity, other specified: Secondary | ICD-10-CM | POA: Insufficient documentation

## 2014-05-25 DIAGNOSIS — M94261 Chondromalacia, right knee: Secondary | ICD-10-CM | POA: Diagnosis not present

## 2014-05-25 DIAGNOSIS — S83241A Other tear of medial meniscus, current injury, right knee, initial encounter: Secondary | ICD-10-CM | POA: Insufficient documentation

## 2014-05-25 HISTORY — PX: LYSIS OF ADHESION: SHX5961

## 2014-05-25 HISTORY — PX: KNEE ARTHROSCOPY WITH MEDIAL MENISECTOMY: SHX5651

## 2014-05-25 HISTORY — DX: Syncope and collapse: R55

## 2014-05-25 HISTORY — DX: Presence of dental prosthetic device (complete) (partial): Z97.2

## 2014-05-25 HISTORY — PX: KNEE ARTHROSCOPY WITH DRILLING/MICROFRACTURE: SHX6425

## 2014-05-25 LAB — POCT HEMOGLOBIN-HEMACUE: HEMOGLOBIN: 14.8 g/dL (ref 12.0–15.0)

## 2014-05-25 SURGERY — ARTHROSCOPY, KNEE, WITH MEDIAL MENISCECTOMY
Anesthesia: General | Site: Knee | Laterality: Right

## 2014-05-25 MED ORDER — ONDANSETRON HCL 4 MG PO TABS: 4.0000 mg | ORAL_TABLET | Freq: Four times a day (QID) | ORAL | Status: DC | PRN

## 2014-05-25 MED ORDER — HYDROMORPHONE HCL 1 MG/ML IJ SOLN: 0.5000 mg | INTRAMUSCULAR | Status: DC | PRN

## 2014-05-25 MED ORDER — METOCLOPRAMIDE HCL 5 MG PO TABS: 5.0000 mg | ORAL_TABLET | Freq: Three times a day (TID) | ORAL | Status: DC | PRN

## 2014-05-25 MED ORDER — OXYCODONE-ACETAMINOPHEN 5-325 MG PO TABS
1.0000 | ORAL_TABLET | ORAL | Status: DC | PRN
  Administered 2014-05-25: 1 via ORAL

## 2014-05-25 MED ORDER — ONDANSETRON HCL 4 MG/2ML IJ SOLN: 4.0000 mg | Freq: Four times a day (QID) | INTRAMUSCULAR | Status: DC | PRN

## 2014-05-25 MED ORDER — PROMETHAZINE HCL 25 MG/ML IJ SOLN: 6.2500 mg | INTRAMUSCULAR | Status: DC | PRN

## 2014-05-25 MED ORDER — KETOROLAC TROMETHAMINE 30 MG/ML IJ SOLN
INTRAMUSCULAR | Status: DC | PRN
  Administered 2014-05-25: 30 mg via INTRAVENOUS

## 2014-05-25 MED ORDER — CEFAZOLIN SODIUM-DEXTROSE 2-3 GM-% IV SOLR
2.0000 g | INTRAVENOUS | Status: AC
  Administered 2014-05-25: 2 g via INTRAVENOUS

## 2014-05-25 MED ORDER — METOCLOPRAMIDE HCL 5 MG/ML IJ SOLN: 5.0000 mg | Freq: Three times a day (TID) | INTRAMUSCULAR | Status: DC | PRN

## 2014-05-25 MED ORDER — BUPIVACAINE HCL (PF) 0.25 % IJ SOLN
INTRAMUSCULAR | Status: AC
  Filled 2014-05-25: qty 30

## 2014-05-25 MED ORDER — BUPIVACAINE HCL (PF) 0.5 % IJ SOLN
INTRAMUSCULAR | Status: DC | PRN
  Administered 2014-05-25: 20 mL

## 2014-05-25 MED ORDER — HYDROMORPHONE HCL 1 MG/ML IJ SOLN
INTRAMUSCULAR | Status: AC
  Filled 2014-05-25: qty 1

## 2014-05-25 MED ORDER — ONDANSETRON HCL 4 MG/2ML IJ SOLN
INTRAMUSCULAR | Status: DC | PRN
  Administered 2014-05-25: 4 mg via INTRAVENOUS

## 2014-05-25 MED ORDER — FENTANYL CITRATE (PF) 100 MCG/2ML IJ SOLN
INTRAMUSCULAR | Status: AC
Start: 2014-05-25 — End: 2014-05-25
  Filled 2014-05-25: qty 6

## 2014-05-25 MED ORDER — PROPOFOL 10 MG/ML IV BOLUS
INTRAVENOUS | Status: DC | PRN
  Administered 2014-05-25: 200 mg via INTRAVENOUS

## 2014-05-25 MED ORDER — BUPIVACAINE HCL (PF) 0.5 % IJ SOLN
INTRAMUSCULAR | Status: AC
  Filled 2014-05-25: qty 30

## 2014-05-25 MED ORDER — MIDAZOLAM HCL 5 MG/5ML IJ SOLN
INTRAMUSCULAR | Status: DC | PRN
  Administered 2014-05-25: 2 mg via INTRAVENOUS

## 2014-05-25 MED ORDER — HYDROMORPHONE HCL 1 MG/ML IJ SOLN
0.2500 mg | INTRAMUSCULAR | Status: DC | PRN
  Administered 2014-05-25 (×2): 0.5 mg via INTRAVENOUS

## 2014-05-25 MED ORDER — SODIUM CHLORIDE 0.9 % IR SOLN
Status: DC | PRN
  Administered 2014-05-25: 6000 mL

## 2014-05-25 MED ORDER — METHYLPREDNISOLONE ACETATE 80 MG/ML IJ SUSP
INTRAMUSCULAR | Status: AC
  Filled 2014-05-25: qty 1

## 2014-05-25 MED ORDER — DEXAMETHASONE SODIUM PHOSPHATE 4 MG/ML IJ SOLN
INTRAMUSCULAR | Status: DC | PRN
  Administered 2014-05-25: 10 mg via INTRAVENOUS

## 2014-05-25 MED ORDER — CHLORHEXIDINE GLUCONATE 4 % EX LIQD: 60.0000 mL | Freq: Once | CUTANEOUS | Status: DC

## 2014-05-25 MED ORDER — METHYLPREDNISOLONE ACETATE 80 MG/ML IJ SUSP
INTRAMUSCULAR | Status: DC | PRN
  Administered 2014-05-25: 80 mg

## 2014-05-25 MED ORDER — FENTANYL CITRATE (PF) 100 MCG/2ML IJ SOLN
INTRAMUSCULAR | Status: DC | PRN
  Administered 2014-05-25: 100 ug via INTRAVENOUS

## 2014-05-25 MED ORDER — OXYCODONE-ACETAMINOPHEN 5-325 MG PO TABS: 1.0000 | ORAL_TABLET | ORAL | Status: DC | PRN

## 2014-05-25 MED ORDER — OXYCODONE-ACETAMINOPHEN 5-325 MG PO TABS
ORAL_TABLET | ORAL | Status: AC
  Filled 2014-05-25: qty 1

## 2014-05-25 MED ORDER — CEFAZOLIN SODIUM-DEXTROSE 2-3 GM-% IV SOLR
INTRAVENOUS | Status: AC
  Filled 2014-05-25: qty 50

## 2014-05-25 MED ORDER — LACTATED RINGERS IV SOLN
INTRAVENOUS | Status: DC
  Administered 2014-05-25: 08:00:00 via INTRAVENOUS

## 2014-05-25 MED ORDER — MIDAZOLAM HCL 2 MG/2ML IJ SOLN
INTRAMUSCULAR | Status: AC
  Filled 2014-05-25: qty 2

## 2014-05-25 MED ORDER — LIDOCAINE HCL (CARDIAC) 20 MG/ML IV SOLN
INTRAVENOUS | Status: DC | PRN
  Administered 2014-05-25: 80 mg via INTRAVENOUS

## 2014-05-25 SURGICAL SUPPLY — 48 items
BANDAGE ELASTIC 6 VELCRO ST LF (GAUZE/BANDAGES/DRESSINGS) ×4 IMPLANT
BLADE CUDA 5.5 (BLADE) IMPLANT
BLADE CUDA GRT WHITE 3.5 (BLADE) IMPLANT
BLADE CUTTER GATOR 3.5 (BLADE) ×4 IMPLANT
BLADE CUTTER MENIS 5.5 (BLADE) IMPLANT
BLADE GREAT WHITE 4.2 (BLADE) ×3 IMPLANT
BLADE GREAT WHITE 4.2MM (BLADE) ×1
BLADE SURG 15 STRL LF DISP TIS (BLADE) IMPLANT
BLADE SURG 15 STRL SS (BLADE)
BUR OVAL 4.0 (BURR) IMPLANT
CUTTER MENISCUS  4.2MM (BLADE)
CUTTER MENISCUS 4.2MM (BLADE) IMPLANT
DRAPE ARTHROSCOPY W/POUCH 90 (DRAPES) ×4 IMPLANT
DURAPREP 26ML APPLICATOR (WOUND CARE) ×4 IMPLANT
ELECT MENISCUS 165MM 90D (ELECTRODE) IMPLANT
ELECT REM PT RETURN 9FT ADLT (ELECTROSURGICAL)
ELECTRODE REM PT RTRN 9FT ADLT (ELECTROSURGICAL) IMPLANT
GAUZE SPONGE 4X4 12PLY STRL (GAUZE/BANDAGES/DRESSINGS) ×8 IMPLANT
GAUZE XEROFORM 1X8 LF (GAUZE/BANDAGES/DRESSINGS) ×4 IMPLANT
GLOVE BIOGEL M STRL SZ7.5 (GLOVE) ×4 IMPLANT
GLOVE BIOGEL PI IND STRL 7.0 (GLOVE) ×2 IMPLANT
GLOVE BIOGEL PI IND STRL 8 (GLOVE) ×2 IMPLANT
GLOVE BIOGEL PI INDICATOR 7.0 (GLOVE) ×2
GLOVE BIOGEL PI INDICATOR 8 (GLOVE) ×2
GLOVE ECLIPSE 7.0 STRL STRAW (GLOVE) ×4 IMPLANT
GLOVE ORTHO TXT STRL SZ7.5 (GLOVE) IMPLANT
GLOVE SURG ORTHO 8.0 STRL STRW (GLOVE) ×4 IMPLANT
GOWN STRL REUS W/ TWL LRG LVL3 (GOWN DISPOSABLE) ×2 IMPLANT
GOWN STRL REUS W/ TWL XL LVL3 (GOWN DISPOSABLE) ×4 IMPLANT
GOWN STRL REUS W/TWL LRG LVL3 (GOWN DISPOSABLE) ×2
GOWN STRL REUS W/TWL XL LVL3 (GOWN DISPOSABLE) ×4
HOLDER KNEE FOAM BLUE (MISCELLANEOUS) ×4 IMPLANT
IV NS IRRIG 3000ML ARTHROMATIC (IV SOLUTION) ×8 IMPLANT
KNEE WRAP E Z 3 GEL PACK (MISCELLANEOUS) ×4 IMPLANT
MANIFOLD NEPTUNE II (INSTRUMENTS) ×4 IMPLANT
PACK ARTHROSCOPY DSU (CUSTOM PROCEDURE TRAY) ×4 IMPLANT
PACK BASIN DAY SURGERY FS (CUSTOM PROCEDURE TRAY) ×4 IMPLANT
PENCIL BUTTON HOLSTER BLD 10FT (ELECTRODE) IMPLANT
SET ARTHROSCOPY TUBING (MISCELLANEOUS) ×2
SET ARTHROSCOPY TUBING LN (MISCELLANEOUS) ×2 IMPLANT
SPONGE LAP 4X18 X RAY DECT (DISPOSABLE) IMPLANT
SUCTION FRAZIER TIP 10 FR DISP (SUCTIONS) IMPLANT
SUT ETHILON 3 0 PS 1 (SUTURE) ×4 IMPLANT
SUT VIC AB 2-0 SH 27 (SUTURE)
SUT VIC AB 2-0 SH 27XBRD (SUTURE) IMPLANT
SUT VIC AB 3-0 FS2 27 (SUTURE) IMPLANT
TOWEL OR 17X24 6PK STRL BLUE (TOWEL DISPOSABLE) ×4 IMPLANT
WATER STERILE IRR 1000ML POUR (IV SOLUTION) ×4 IMPLANT

## 2014-05-25 NOTE — Anesthesia Procedure Notes (Signed)
Procedure Name: LMA Insertion Performed by: Terrance Mass Pre-anesthesia Checklist: Patient identified, Timeout performed, Emergency Drugs available, Suction available and Patient being monitored Patient Re-evaluated:Patient Re-evaluated prior to inductionOxygen Delivery Method: Circle system utilized Preoxygenation: Pre-oxygenation with 100% oxygen Intubation Type: IV induction Ventilation: Mask ventilation without difficulty LMA: LMA inserted LMA Size: 4.0 Tube type: Oral Number of attempts: 1 Placement Confirmation: positive ETCO2 Tube secured with: Tape Dental Injury: Teeth and Oropharynx as per pre-operative assessment

## 2014-05-25 NOTE — Anesthesia Postprocedure Evaluation (Signed)
  Anesthesia Post-op Note  Patient: Avamarie Crossley  Procedure(s) Performed: Procedure(s) (LRB): RIGHT KNEE ARTHROSCOPY WITH PARTIAL MEDIAL (Right) KNEE ARTHROSCOPY WITH MICROFRACTURE PATELLA FEMORAL CONDYLE (Right) LYSIS OF ADHESION (Right)  Patient Location: PACU  Anesthesia Type: General  Level of Consciousness: awake and alert   Airway and Oxygen Therapy: Patient Spontanous Breathing  Post-op Pain: mild  Post-op Assessment: Post-op Vital signs reviewed, Patient's Cardiovascular Status Stable, Respiratory Function Stable, Patent Airway and No signs of Nausea or vomiting  Last Vitals:  Filed Vitals:   05/25/14 0915  BP: 104/76  Pulse: 72  Temp:   Resp: 10    Post-op Vital Signs: stable   Complications: No apparent anesthesia complications

## 2014-05-25 NOTE — Discharge Instructions (Signed)
Discharge Instructions after Knee Arthroscopy  NON-WEIGHT BEARING FOR THE NEXT 6 WEEKS  You will have a light dressing on your knee.  Leave the dressing in place until the third day after your surgery and then remove it and place a band-aid over the stitches.  After the bandage has been removed you may shower, but do not soak the incision. You may begin gentle motion of your leg immediately after surgery. Pump your foot up and down 20 times per hour, every hour you are awake.  Apply ice to the knee 3 times per day for 30 minutes for the first 1 week until your knee is feeling comfortable again. Do not use heat.  You may begin straight leg raising exercises (if you have a brace with it on). While lying down, pull your foot all the way up, tighten your quadriceps muscle and lift your heel off of the ground. Hold this position for 2 seconds, and then let the leg back down. Repeat the exercise 10 times, at least 3 times a day.  Pain medicine has been prescribed for you.  Use your medicine as needed over the first 48 hours, and then you can begin to taper your use. You may take Extra Strength Tylenol or Tylenol only in place of the pain pills.  Follow up appointment in one week   Please call [phone] for any problems. Including the following:  - excessive redness of the incisions - drainage for more than 4 days - fever of more than 101.5 F   *Please note that pain medications will not be refilled after hours or on weekends.   Post Anesthesia Home Care Instructions  Activity: Get plenty of rest for the remainder of the day. A responsible adult should stay with you for 24 hours following the procedure.  For the next 24 hours, DO NOT: -Drive a car -Paediatric nurse -Drink alcoholic beverages -Take any medication unless instructed by your physician -Make any legal decisions or sign important papers.  Meals: Start with liquid foods such as gelatin or soup. Progress to regular foods  as tolerated. Avoid greasy, spicy, heavy foods. If nausea and/or vomiting occur, drink only clear liquids until the nausea and/or vomiting subsides. Call your physician if vomiting continues.  Special Instructions/Symptoms: Your throat may feel dry or sore from the anesthesia or the breathing tube placed in your throat during surgery. If this causes discomfort, gargle with warm salt water. The discomfort should disappear within 24 hours.  If you had a scopolamine patch placed behind your ear for the management of post- operative nausea and/or vomiting:  1. The medication in the patch is effective for 72 hours, after which it should be removed.  Wrap patch in a tissue and discard in the trash. Wash hands thoroughly with soap and water. 2. You may remove the patch earlier than 72 hours if you experience unpleasant side effects which may include dry mouth, dizziness or visual disturbances. 3. Avoid touching the patch. Wash your hands with soap and water after contact with the patch.

## 2014-05-25 NOTE — Progress Notes (Signed)
Refuses to remove ring on right thumb and refuses to remove navel ring. Aware of risks. Will notify surgeon

## 2014-05-25 NOTE — Transfer of Care (Signed)
Immediate Anesthesia Transfer of Care Note  Patient: Jasmine Hahn  Procedure(s) Performed: Procedure(s): RIGHT KNEE ARTHROSCOPY WITH PARTIAL MEDIAL (Right) KNEE ARTHROSCOPY WITH DRILLING/MICROFRACTURE (Right) LYSIS OF ADHESION (Right)  Patient Location: PACU  Anesthesia Type:General  Level of Consciousness: awake and sedated  Airway & Oxygen Therapy: Patient Spontanous Breathing and Patient connected to face mask oxygen  Post-op Assessment: Report given to RN and Post -op Vital signs reviewed and stable  Post vital signs: Reviewed and stable  Last Vitals:  Filed Vitals:   05/25/14 0741  BP: 121/80  Pulse: 77  Temp: 36.6 C  Resp: 16    Complications: No apparent anesthesia complications

## 2014-05-25 NOTE — Progress Notes (Signed)
Pt just realized teeth were taken out - asked "why did they take out my teeth ?". Explained you need them out for safety reasons and were taken out during surgery. Pt said "I am never coming back here, I always leave my teeth in !" I apologized and said I was sorry but we have to keep out patients safe.

## 2014-05-25 NOTE — Interval H&P Note (Signed)
History and Physical Interval Note:  05/25/2014 7:30 AM  [name]  has presented today for surgery, with the diagnosis of OTHER TEAR OF MEDIAL MENISCUS, CURRENT INJURY, UNSPECIFIED KNEE  The various methods of treatment have been discussed with the patient and family. After consideration of risks, benefits and other options for treatment, the patient has consented to  Procedure(s): RIGHT KNEE ARTHROSCOPY WITH MEDIAL MENISECTOMY,MICROFRACTURE/RIGHT KNEE OSTEOCHONDRAL ALLOGRAFT OPEN (Right) as a surgical intervention .  The patient's history has been reviewed, patient examined, no change in status, stable for surgery.  I have reviewed the patient's chart and labs.  Questions were answered to the patient's satisfaction.     Jasmine Hahn

## 2014-05-25 NOTE — Anesthesia Preprocedure Evaluation (Addendum)
Anesthesia Evaluation  Patient identified by MRN, date of birth, ID band Patient awake    Reviewed: Allergy & Precautions, NPO status , Patient's Chart, lab work & pertinent test results  Airway Mallampati: II  TM Distance: >3 FB Neck ROM: Full    Dental  (+) Upper Dentures, Dental Advisory Given   Pulmonary neg pulmonary ROS, former smoker,  breath sounds clear to auscultation  Pulmonary exam normal       Cardiovascular negative cardio ROS Normal cardiovascular examRhythm:Regular Rate:Normal     Neuro/Psych negative neurological ROS  negative psych ROS   GI/Hepatic Neg liver ROS, GERD-  Medicated,  Endo/Other  Hypothyroidism   Renal/GU negative Renal ROS  negative genitourinary   Musculoskeletal negative musculoskeletal ROS (+)   Abdominal   Peds negative pediatric ROS (+)  Hematology negative hematology ROS (+)   Anesthesia Other Findings Refuses to remove belly button ring  Reproductive/Obstetrics negative OB ROS                            Anesthesia Physical Anesthesia Plan  ASA: II  Anesthesia Plan: General   Post-op Pain Management:    Induction: Intravenous  Airway Management Planned: LMA  Additional Equipment:   Intra-op Plan:   Post-operative Plan: Extubation in OR  Informed Consent: I have reviewed the patients History and Physical, chart, labs and discussed the procedure including the risks, benefits and alternatives for the proposed anesthesia with the patient or authorized representative who has indicated his/her understanding and acceptance.   Dental advisory given  Plan Discussed with: CRNA and Surgeon  Anesthesia Plan Comments:         Anesthesia Quick Evaluation

## 2014-05-26 ENCOUNTER — Encounter (HOSPITAL_BASED_OUTPATIENT_CLINIC_OR_DEPARTMENT_OTHER): Payer: Self-pay | Admitting: Orthopedic Surgery

## 2014-05-26 NOTE — Op Note (Signed)
NAME:  Jasmine Hahn, KISHI NO.:  [id number]  MEDICAL RECORD NO.:  [id number]  LOCATION:                                FACILITY:  [facility]  PHYSICIAN:  Ninetta Lights, M.D. DATE OF BIRTH:  1960-08-29  DATE OF PROCEDURE:  05/25/2014 DATE OF DISCHARGE:  05/25/2014                              OPERATIVE REPORT   PREOPERATIVE DIAGNOSIS:  Right knee medial meniscus tear.  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  POSTOPERATIVE DIAGNOSIS:  Right knee medial meniscus tear.  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 Thinned articular cartilage around the margin with chondral loose bodies.  Also, some anterior and anteromedial adhesions.  PROCEDURE:  Right knee exam under anesthesia, arthroscopy.  Partial medial meniscectomy.  Chondroplasty, microfracturing, medial femoral condyle.  Lysis of debridement of adhesions.  SURGEON:  Ninetta Lights, M.D.  ASSISTANT:  Elmyra Ricks, PA-C  ANESTHESIA:  General.  BLOOD LOSS:  Minimal.  SPECIMENS:  None.  CULTURES:  None.  COMPLICATIONS:  None.  DRESSINGS:  Soft compressive.  TOURNIQUET:  Not employed.  DESCRIPTION OF PROCEDURE:  The patient was brought to the operating room, placed on the operating table in supine position.  After adequate anesthesia had been obtained, leg holder applied.  Leg prepped and draped in usual sterile fashion.  Two portals, one each medial and lateral parapatellar.  Arthroscope was introduced.  Knee distended and inspected.  Articular cartilage, patellofemoral joint very mild changes. A lot of the anterior anteromedial adhesions excised.  Good tracking. Lateral meniscus, lateral compartment and cruciate ligaments normal. Medial meniscus, extensive complex tearing  of posterior half.  Taken down to a stable rim, tapered into remaining meniscus.  The tibia looked good.  The femur, however, had a large full-thickness chondral lesion 18 mm diameter.  The margins of this were also thinned cartilage on either side.  This was debrided.  Loose bodies removed.  I then did a microfracturing at the base to promote fibrocartilage ingrowth.  It was noted that she had extremely soft bone at the base of this, still microfracturing could be done despite pushing the all through the bone, not requiring much of a mallet.  After I confirmed good bleeding with microfracturing, entire knee examined.  No other significant findings were appreciated.  Instruments were fully removed.  Portals were closed with nylon.  Sterile compressive dressing applied.  Anesthesia reversed. Brought to the recovery room.  Tolerated the surgery well.  No complications.     Ninetta Lights, M.D.     DFM/MEDQ  D:  05/25/2014  T:  05/26/2014  Job:  [id number]

## 2014-06-02 HISTORY — PX: COLONOSCOPY: SHX174

## 2014-06-02 HISTORY — PX: ESOPHAGOGASTRODUODENOSCOPY: SHX1529

## 2014-06-15 ENCOUNTER — Ambulatory Visit: Payer: 59 | Admitting: Internal Medicine

## 2014-06-27 ENCOUNTER — Other Ambulatory Visit: Payer: Self-pay | Admitting: Internal Medicine

## 2014-06-28 ENCOUNTER — Other Ambulatory Visit: Payer: Self-pay | Admitting: Internal Medicine

## 2014-07-16 ENCOUNTER — Encounter: Payer: Self-pay | Admitting: *Deleted

## 2014-07-25 ENCOUNTER — Other Ambulatory Visit: Payer: Self-pay | Admitting: Physician Assistant

## 2014-07-31 ENCOUNTER — Other Ambulatory Visit: Payer: Self-pay | Admitting: Internal Medicine

## 2014-08-03 ENCOUNTER — Telehealth: Payer: Self-pay | Admitting: *Deleted

## 2014-08-03 ENCOUNTER — Other Ambulatory Visit: Payer: Self-pay | Admitting: Internal Medicine

## 2014-08-03 ENCOUNTER — Encounter: Payer: Self-pay | Admitting: Internal Medicine

## 2014-08-03 ENCOUNTER — Other Ambulatory Visit: Payer: Self-pay | Admitting: *Deleted

## 2014-08-03 ENCOUNTER — Encounter: Payer: Self-pay | Admitting: Nurse Practitioner

## 2014-08-03 MED ORDER — SYNTHROID 88 MCG PO TABS: ORAL_TABLET | ORAL | Status: DC

## 2014-08-03 NOTE — Telephone Encounter (Signed)
Informed the patient that as a cardiology office, we do not typically prescribe Synthroid. Patient st she accidentally sent the message to our office instead of her PCP. She is thankful for callback.

## 2014-08-03 NOTE — Telephone Encounter (Signed)
Let message to inform patient that a refill for # 30 Synthroid was sent to her pharmacy, but has to have an OV before future refills.

## 2014-09-29 ENCOUNTER — Other Ambulatory Visit: Payer: Self-pay | Admitting: Internal Medicine

## 2014-11-28 ENCOUNTER — Emergency Department (HOSPITAL_COMMUNITY)
Admission: EM | Admit: 2014-11-28 | Discharge: 2014-11-28 | Discharge status: Against Medical Advice | Payer: 59 | Attending: Emergency Medicine | Admitting: Emergency Medicine

## 2014-11-28 ENCOUNTER — Encounter (HOSPITAL_COMMUNITY): Payer: Self-pay | Admitting: Emergency Medicine

## 2014-11-28 DIAGNOSIS — R42 Dizziness and giddiness: Secondary | ICD-10-CM | POA: Insufficient documentation

## 2014-11-28 DIAGNOSIS — R1010 Upper abdominal pain, unspecified: Secondary | ICD-10-CM | POA: Insufficient documentation

## 2014-11-28 DIAGNOSIS — R110 Nausea: Secondary | ICD-10-CM | POA: Diagnosis not present

## 2014-11-28 NOTE — ED Notes (Signed)
IV removed by Saverio Danker, EMT  Pt states she feels better and does not wish to be seen

## 2014-11-28 NOTE — ED Notes (Signed)
Per EMS pt states she woke up around 1 am with severe upper abd pain  Pt states the pain radiates across the top of her abdomen and down the left side  Pt is c/o nausea and dizziness  IV was started and Zofran 4mg  was given in route with some relief noted

## 2015-01-27 ENCOUNTER — Encounter: Payer: Self-pay | Admitting: Internal Medicine

## 2015-01-31 ENCOUNTER — Encounter: Payer: Self-pay | Admitting: Internal Medicine

## 2016-01-31 ENCOUNTER — Encounter: Payer: Self-pay | Admitting: Internal Medicine

## 2016-04-02 NOTE — H&P (Signed)
Jasmine Hahn presents for follow up of right knee pain.  She feels as though her knee is getting worse and has gotten a lot worse since her last visit.  She wears a copper sleeve to help with her pain, but this is not fully helping with her pain.  She has a history of a right knee arthroscopy on May 24, 2013 for a partial medial meniscectomy, lysis of adhesions, debridement and microfracture of medial femoral condyle.  Large full thickness chondral lesions were seen in the medial femoral condyle.  She wants to talk today about total versus partial knee replacement and she would like to get this done at her earliest convenience.   Past medical history: None. Past surgical history: Right knee scope in 2015.   Review of systems: Negative, except as per HPI.  Family history: Significant for diabetes.  EXAMINATION: Well-developed, well-nourished female in no acute distress.  Evaluation of her right knee reveals medial joint line pain.  No other joint pain on palpation.  She has no effusion present.  She has full range of motion bilaterally.  She has mild patellofemoral crepitus.  She has full strength bilaterally.  She is neurovascularly intact bilaterally.    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    PLAN: Due to her debilitating knee pain and the large chondral defect seen on her knee arthroscopy three years ago we will proceed with an MRI to assess options for a unicompartmental knee replacement.  She had improved and then her symptoms went downhill.  Would like to assess options for medial compartment biologic versus metallic unicompartmental replacement and would also like to assess the rest of her knee at this time.  She expressed understanding and will follow up after her MRI to discuss the results.    Addendum: Jasmine Hahn comes in for follow up.  I went over her recent MRI.  Looked at her previous scope pictures as well.  She has full thickness cartilage loss  over the great majority of her medial compartment.  There are really no chondral shoulders that would allow for biologic replacement.  Her other compartments continue to look quite good.  Ligaments are intact.  I have reviewed the scan, all of the images and shown them to her.  Her symptoms continue to be very localized medial compartment.  We have done a previous arthroscopy, debridement and microfracturing without significant improvement.  Recommendation at this point in time is a medial unicompartmental replacement.  I do not think she needs a total knee.  I think based on the findings this should hold up over time and is the best approach to take.  That procedure, risks, benefits and complications reviewed.  What to anticipate intra and post-op reviewed.  Paperwork complete.  All questions answered.  I will see her at the time of operative intervention.  I have told her that I, as well as Mendel Ryder, my PA, would be available should she have questions in the interim.

## 2016-04-08 ENCOUNTER — Encounter (HOSPITAL_BASED_OUTPATIENT_CLINIC_OR_DEPARTMENT_OTHER): Payer: Self-pay | Admitting: *Deleted

## 2016-04-10 ENCOUNTER — Encounter (HOSPITAL_BASED_OUTPATIENT_CLINIC_OR_DEPARTMENT_OTHER)
Admission: RE | Admit: 2016-04-10 | Discharge: 2016-04-10 | Disposition: A | Discharge status: Home / Self Care | Payer: 59 | Source: Ambulatory Visit | Attending: Orthopedic Surgery | Admitting: Orthopedic Surgery

## 2016-04-10 ENCOUNTER — Other Ambulatory Visit: Payer: Self-pay

## 2016-04-10 DIAGNOSIS — Z79899 Other long term (current) drug therapy: Secondary | ICD-10-CM | POA: Diagnosis not present

## 2016-04-10 DIAGNOSIS — Z87891 Personal history of nicotine dependence: Secondary | ICD-10-CM | POA: Diagnosis not present

## 2016-04-10 DIAGNOSIS — M1711 Unilateral primary osteoarthritis, right knee: Secondary | ICD-10-CM | POA: Insufficient documentation

## 2016-04-10 DIAGNOSIS — E039 Hypothyroidism, unspecified: Secondary | ICD-10-CM | POA: Diagnosis not present

## 2016-04-10 DIAGNOSIS — L930 Discoid lupus erythematosus: Secondary | ICD-10-CM | POA: Diagnosis not present

## 2016-04-10 DIAGNOSIS — K219 Gastro-esophageal reflux disease without esophagitis: Secondary | ICD-10-CM | POA: Diagnosis not present

## 2016-04-10 LAB — SURGICAL PCR SCREEN
MRSA, PCR: NEGATIVE
STAPHYLOCOCCUS AUREUS: POSITIVE — AB

## 2016-04-10 NOTE — Progress Notes (Addendum)
PCR screen completed/ EKG completed/ Hibiclens and instructions given to pt/ pt verbalized understanding.  Pt informed that ALL jewelry must be removed before surgery.  Pt states: "I have had surgery before with all of it on, I'm not taking it off"  Pt informed of the risks and still refuses.  Dr. Debroah Loop office aware.

## 2016-04-10 NOTE — Progress Notes (Signed)
PCR positive for Staphylococcus Aureus, called prescription to [facility], notified pt.

## 2016-04-11 NOTE — Progress Notes (Signed)
Verified that patient got message about PCR positive- pt states will pick up mupirocin today. Notified Dr. D.Murphy's office Venida Jarvis)  About pt's results of PCR.

## 2016-04-16 NOTE — Anesthesia Preprocedure Evaluation (Addendum)
Anesthesia Evaluation  Patient identified by MRN, date of birth, ID band Patient awake    Reviewed: Allergy & Precautions, NPO status , Patient's Chart, lab work & pertinent test results  Airway Mallampati: II  TM Distance: >3 FB Neck ROM: Full    Dental no notable dental hx.    Pulmonary neg pulmonary ROS, former smoker,    Pulmonary exam normal breath sounds clear to auscultation       Cardiovascular Exercise Tolerance: Good negative cardio ROS Normal cardiovascular exam Rhythm:Regular Rate:Normal     Neuro/Psych negative neurological ROS  negative psych ROS   GI/Hepatic negative GI ROS, Neg liver ROS, hiatal hernia, GERD  Medicated,  Endo/Other  negative endocrine ROSHypothyroidism Discoid lupus  Renal/GU negative Renal ROS  negative genitourinary   Musculoskeletal negative musculoskeletal ROS (+) Arthritis , Osteoarthritis,    Abdominal   Peds negative pediatric ROS (+)  Hematology negative hematology ROS (+)   Anesthesia Other Findings Day of surgery medications reviewed with the patient.  Reproductive/Obstetrics negative OB ROS                           Anesthesia Physical Anesthesia Plan  ASA: II  Anesthesia Plan: General and Regional   Post-op Pain Management: GA combined w/ Regional for post-op pain   Induction: Intravenous  Airway Management Planned: LMA  Additional Equipment:   Intra-op Plan:   Post-operative Plan: Extubation in OR  Informed Consent: I have reviewed the patients History and Physical, chart, labs and discussed the procedure including the risks, benefits and alternatives for the proposed anesthesia with the patient or authorized representative who has indicated his/her understanding and acceptance.   Dental advisory given  Plan Discussed with: CRNA  Anesthesia Plan Comments: (GA plus Right Adductor canal block.)      Anesthesia Quick  Evaluation

## 2016-04-17 ENCOUNTER — Ambulatory Visit (HOSPITAL_BASED_OUTPATIENT_CLINIC_OR_DEPARTMENT_OTHER)
Admission: RE | Admit: 2016-04-17 | Discharge: 2016-04-17 | Disposition: A | Discharge status: Home / Self Care | Payer: 59 | Source: Ambulatory Visit | Attending: Orthopedic Surgery | Admitting: Orthopedic Surgery

## 2016-04-17 ENCOUNTER — Ambulatory Visit (HOSPITAL_BASED_OUTPATIENT_CLINIC_OR_DEPARTMENT_OTHER): Payer: 59 | Admitting: Anesthesiology

## 2016-04-17 ENCOUNTER — Encounter (HOSPITAL_BASED_OUTPATIENT_CLINIC_OR_DEPARTMENT_OTHER): Payer: Self-pay | Admitting: *Deleted

## 2016-04-17 ENCOUNTER — Encounter (HOSPITAL_BASED_OUTPATIENT_CLINIC_OR_DEPARTMENT_OTHER)
Admission: RE | Disposition: A | Discharge status: Home / Self Care | Payer: Self-pay | Source: Ambulatory Visit | Attending: Orthopedic Surgery

## 2016-04-17 DIAGNOSIS — Z79899 Other long term (current) drug therapy: Secondary | ICD-10-CM | POA: Insufficient documentation

## 2016-04-17 DIAGNOSIS — E039 Hypothyroidism, unspecified: Secondary | ICD-10-CM | POA: Insufficient documentation

## 2016-04-17 DIAGNOSIS — Z87891 Personal history of nicotine dependence: Secondary | ICD-10-CM | POA: Insufficient documentation

## 2016-04-17 DIAGNOSIS — M1711 Unilateral primary osteoarthritis, right knee: Secondary | ICD-10-CM | POA: Insufficient documentation

## 2016-04-17 DIAGNOSIS — K219 Gastro-esophageal reflux disease without esophagitis: Secondary | ICD-10-CM | POA: Insufficient documentation

## 2016-04-17 DIAGNOSIS — L930 Discoid lupus erythematosus: Secondary | ICD-10-CM | POA: Insufficient documentation

## 2016-04-17 HISTORY — DX: Personal history of other diseases of the digestive system: Z87.19

## 2016-04-17 HISTORY — DX: Unilateral primary osteoarthritis, right knee: M17.11

## 2016-04-17 HISTORY — DX: Unspecified osteoarthritis, unspecified site: M19.90

## 2016-04-17 HISTORY — DX: Hypothyroidism, unspecified: E03.9

## 2016-04-17 HISTORY — PX: PARTIAL KNEE ARTHROPLASTY: SHX2174

## 2016-04-17 SURGERY — ARTHROPLASTY, KNEE, UNICOMPARTMENTAL
Anesthesia: Regional | Site: Knee | Laterality: Right

## 2016-04-17 MED ORDER — ONDANSETRON HCL 4 MG/2ML IJ SOLN
4.0000 mg | Freq: Four times a day (QID) | INTRAMUSCULAR | Status: DC | PRN
Start: 2016-04-17 — End: 2016-04-17

## 2016-04-17 MED ORDER — MIDAZOLAM HCL 2 MG/2ML IJ SOLN
INTRAMUSCULAR | Status: AC
  Filled 2016-04-17: qty 2

## 2016-04-17 MED ORDER — HYDROMORPHONE HCL 1 MG/ML IJ SOLN: 0.5000 mg | INTRAMUSCULAR | Status: DC | PRN

## 2016-04-17 MED ORDER — OXYCODONE-ACETAMINOPHEN 5-325 MG PO TABS: 1.0000 | ORAL_TABLET | ORAL | Status: DC | PRN

## 2016-04-17 MED ORDER — METOCLOPRAMIDE HCL 5 MG/ML IJ SOLN: 5.0000 mg | Freq: Three times a day (TID) | INTRAMUSCULAR | Status: DC | PRN

## 2016-04-17 MED ORDER — OXYCODONE HCL 5 MG PO TABS
5.0000 mg | ORAL_TABLET | Freq: Once | ORAL | Status: AC | PRN
  Administered 2016-04-17: 5 mg via ORAL

## 2016-04-17 MED ORDER — HYDROMORPHONE HCL 1 MG/ML IJ SOLN
INTRAMUSCULAR | Status: AC
  Filled 2016-04-17: qty 1

## 2016-04-17 MED ORDER — OXYCODONE HCL 5 MG PO TABS
ORAL_TABLET | ORAL | Status: AC
  Filled 2016-04-17: qty 1

## 2016-04-17 MED ORDER — SODIUM CHLORIDE 0.9 % IJ SOLN
INTRAMUSCULAR | Status: AC
  Filled 2016-04-17: qty 10

## 2016-04-17 MED ORDER — DEXTROSE 5 % IV SOLN: 500.0000 mg | Freq: Four times a day (QID) | INTRAVENOUS | Status: DC | PRN

## 2016-04-17 MED ORDER — OXYCODONE-ACETAMINOPHEN 5-325 MG PO TABS: 1.0000 | ORAL_TABLET | ORAL | 0 refills | Status: DC | PRN

## 2016-04-17 MED ORDER — DEXAMETHASONE SODIUM PHOSPHATE 10 MG/ML IJ SOLN
INTRAMUSCULAR | Status: AC
  Filled 2016-04-17: qty 1

## 2016-04-17 MED ORDER — LACTATED RINGERS IV SOLN
INTRAVENOUS | Status: DC
  Administered 2016-04-17: 08:00:00 via INTRAVENOUS
  Administered 2016-04-17: 10 mL/h via INTRAVENOUS
  Administered 2016-04-17: 08:00:00 via INTRAVENOUS

## 2016-04-17 MED ORDER — BUPIVACAINE LIPOSOME 1.3 % IJ SUSP
INTRAMUSCULAR | Status: DC | PRN
  Administered 2016-04-17: 20 mL

## 2016-04-17 MED ORDER — PROMETHAZINE HCL 25 MG/ML IJ SOLN: 6.2500 mg | INTRAMUSCULAR | Status: DC | PRN

## 2016-04-17 MED ORDER — CHLORHEXIDINE GLUCONATE 4 % EX LIQD: 60.0000 mL | Freq: Once | CUTANEOUS | Status: DC

## 2016-04-17 MED ORDER — FENTANYL CITRATE (PF) 100 MCG/2ML IJ SOLN
INTRAMUSCULAR | Status: AC
  Filled 2016-04-17: qty 2

## 2016-04-17 MED ORDER — ZOLPIDEM TARTRATE 10 MG PO TABS: 10.0000 mg | ORAL_TABLET | Freq: Every day | ORAL | Status: DC

## 2016-04-17 MED ORDER — SCOPOLAMINE 1 MG/3DAYS TD PT72: 1.0000 | MEDICATED_PATCH | Freq: Once | TRANSDERMAL | Status: DC | PRN

## 2016-04-17 MED ORDER — HYDROMORPHONE HCL 1 MG/ML IJ SOLN
0.2500 mg | INTRAMUSCULAR | Status: DC | PRN
  Administered 2016-04-17 (×3): 0.5 mg via INTRAVENOUS

## 2016-04-17 MED ORDER — ONDANSETRON HCL 4 MG PO TABS: 4.0000 mg | ORAL_TABLET | Freq: Four times a day (QID) | ORAL | Status: DC | PRN

## 2016-04-17 MED ORDER — ONDANSETRON HCL 4 MG PO TABS: 4.0000 mg | ORAL_TABLET | Freq: Three times a day (TID) | ORAL | 0 refills | Status: DC | PRN

## 2016-04-17 MED ORDER — LIDOCAINE 2% (20 MG/ML) 5 ML SYRINGE
INTRAMUSCULAR | Status: AC
  Filled 2016-04-17: qty 5

## 2016-04-17 MED ORDER — LACTATED RINGERS IV SOLN: INTRAVENOUS | Status: DC

## 2016-04-17 MED ORDER — KETOROLAC TROMETHAMINE 30 MG/ML IJ SOLN
30.0000 mg | Freq: Once | INTRAMUSCULAR | Status: DC | PRN
  Administered 2016-04-17: 30 mg via INTRAVENOUS

## 2016-04-17 MED ORDER — CEFAZOLIN SODIUM-DEXTROSE 2-4 GM/100ML-% IV SOLN
2.0000 g | INTRAVENOUS | Status: AC
  Administered 2016-04-17: 2 g via INTRAVENOUS

## 2016-04-17 MED ORDER — MIDAZOLAM HCL 2 MG/2ML IJ SOLN
1.0000 mg | INTRAMUSCULAR | Status: DC | PRN
  Administered 2016-04-17: 2 mg via INTRAVENOUS
  Administered 2016-04-17: 1 mg via INTRAVENOUS

## 2016-04-17 MED ORDER — METOCLOPRAMIDE HCL 5 MG PO TABS: 5.0000 mg | ORAL_TABLET | Freq: Three times a day (TID) | ORAL | Status: DC | PRN

## 2016-04-17 MED ORDER — FAMOTIDINE 40 MG PO TABS: 40.0000 mg | ORAL_TABLET | Freq: Every day | ORAL | Status: DC

## 2016-04-17 MED ORDER — ASPIRIN EC 325 MG PO TBEC: 325.0000 mg | DELAYED_RELEASE_TABLET | Freq: Every day | ORAL | 0 refills | Status: DC

## 2016-04-17 MED ORDER — PROPOFOL 10 MG/ML IV BOLUS
INTRAVENOUS | Status: DC | PRN
  Administered 2016-04-17: 150 mg via INTRAVENOUS

## 2016-04-17 MED ORDER — DEXAMETHASONE SODIUM PHOSPHATE 4 MG/ML IJ SOLN
INTRAMUSCULAR | Status: DC | PRN
  Administered 2016-04-17: 10 mg via INTRAVENOUS

## 2016-04-17 MED ORDER — LIDOCAINE 2% (20 MG/ML) 5 ML SYRINGE
INTRAMUSCULAR | Status: DC | PRN
  Administered 2016-04-17: 100 mg via INTRAVENOUS

## 2016-04-17 MED ORDER — BUPIVACAINE LIPOSOME 1.3 % IJ SUSP
INTRAMUSCULAR | Status: AC
  Filled 2016-04-17: qty 20

## 2016-04-17 MED ORDER — ASPIRIN 325 MG PO TABS: 325.0000 mg | ORAL_TABLET | Freq: Every day | ORAL | Status: DC

## 2016-04-17 MED ORDER — SODIUM CHLORIDE 0.9 % IR SOLN
Status: DC | PRN
  Administered 2016-04-17: 3000 mL

## 2016-04-17 MED ORDER — FENTANYL CITRATE (PF) 100 MCG/2ML IJ SOLN
50.0000 ug | INTRAMUSCULAR | Status: AC | PRN
  Administered 2016-04-17: 50 ug via INTRAVENOUS
  Administered 2016-04-17 (×2): 25 ug via INTRAVENOUS
  Administered 2016-04-17: 50 ug via INTRAVENOUS
  Administered 2016-04-17: 25 ug via INTRAVENOUS
  Administered 2016-04-17: 100 ug via INTRAVENOUS

## 2016-04-17 MED ORDER — SODIUM CHLORIDE 0.9 % IV SOLN: INTRAVENOUS | Status: DC

## 2016-04-17 MED ORDER — MIDAZOLAM HCL 2 MG/2ML IJ SOLN: 1.0000 mg | INTRAMUSCULAR | Status: DC | PRN

## 2016-04-17 MED ORDER — BUPIVACAINE-EPINEPHRINE (PF) 0.5% -1:200000 IJ SOLN
INTRAMUSCULAR | Status: DC | PRN
  Administered 2016-04-17: 30 mL via PERINEURAL

## 2016-04-17 MED ORDER — CEFAZOLIN SODIUM-DEXTROSE 2-4 GM/100ML-% IV SOLN
INTRAVENOUS | Status: AC
  Filled 2016-04-17: qty 100

## 2016-04-17 MED ORDER — FENTANYL CITRATE (PF) 100 MCG/2ML IJ SOLN
25.0000 ug | INTRAMUSCULAR | Status: DC | PRN
  Administered 2016-04-17: 50 ug via INTRAVENOUS
  Administered 2016-04-17 (×2): 25 ug via INTRAVENOUS

## 2016-04-17 MED ORDER — PROPOFOL 500 MG/50ML IV EMUL
INTRAVENOUS | Status: AC
  Filled 2016-04-17: qty 50

## 2016-04-17 MED ORDER — ONDANSETRON HCL 4 MG/2ML IJ SOLN
INTRAMUSCULAR | Status: AC
  Filled 2016-04-17: qty 2

## 2016-04-17 MED ORDER — METHOCARBAMOL 500 MG PO TABS: 500.0000 mg | ORAL_TABLET | Freq: Four times a day (QID) | ORAL | Status: DC | PRN

## 2016-04-17 MED ORDER — KETOROLAC TROMETHAMINE 30 MG/ML IJ SOLN
INTRAMUSCULAR | Status: AC
  Filled 2016-04-17: qty 1

## 2016-04-17 MED ORDER — METHOCARBAMOL 500 MG PO TABS: 500.0000 mg | ORAL_TABLET | Freq: Four times a day (QID) | ORAL | 0 refills | Status: DC

## 2016-04-17 MED ORDER — CEFAZOLIN SODIUM-DEXTROSE 2-4 GM/100ML-% IV SOLN: 2.0000 g | Freq: Four times a day (QID) | INTRAVENOUS | Status: DC

## 2016-04-17 MED ORDER — LEVOTHYROXINE SODIUM 88 MCG PO TABS: 0.1500 ug | ORAL_TABLET | Freq: Every day | ORAL | Status: DC

## 2016-04-17 MED ORDER — ONDANSETRON HCL 4 MG/2ML IJ SOLN
INTRAMUSCULAR | Status: DC | PRN
Start: 2016-04-17 — End: 2016-04-17
  Administered 2016-04-17: 4 mg via INTRAVENOUS

## 2016-04-17 SURGICAL SUPPLY — 78 items
BAG DECANTER FOR FLEXI CONT (MISCELLANEOUS) IMPLANT
BANDAGE ACE 4X5 VEL STRL LF (GAUZE/BANDAGES/DRESSINGS) ×3 IMPLANT
BANDAGE ACE 6X5 VEL STRL LF (GAUZE/BANDAGES/DRESSINGS) ×3 IMPLANT
BANDAGE ESMARK 6X9 LF (GAUZE/BANDAGES/DRESSINGS) ×1 IMPLANT
BENZOIN TINCTURE PRP APPL 2/3 (GAUZE/BANDAGES/DRESSINGS) ×3 IMPLANT
BLADE SAW RECIP 87.9 MT (BLADE) ×3 IMPLANT
BLADE SAW SGTL 13.0X1.19X90.0M (BLADE) ×3 IMPLANT
BLADE SURG 10 STRL SS (BLADE) ×3 IMPLANT
BLADE SURG 15 STRL LF DISP TIS (BLADE) ×2 IMPLANT
BLADE SURG 15 STRL SS (BLADE) ×4
BNDG ESMARK 6X9 LF (GAUZE/BANDAGES/DRESSINGS) ×3
BOWL SMART MIX CTS (DISPOSABLE) ×3 IMPLANT
CAPT KNEE PARTIAL 2 ×3 IMPLANT
CEMENT BONE SIMPLEX SPEEDSET (Cement) ×3 IMPLANT
CLOSURE WOUND 1/2 X4 (GAUZE/BANDAGES/DRESSINGS) ×1
COVER BACK TABLE 60X90IN (DRAPES) ×3 IMPLANT
CUFF TOURNIQUET SINGLE 34IN LL (TOURNIQUET CUFF) ×3 IMPLANT
DECANTER SPIKE VIAL GLASS SM (MISCELLANEOUS) IMPLANT
DRAPE EXTREMITY T 121X128X90 (DRAPE) ×3 IMPLANT
DRAPE IMP U-DRAPE 54X76 (DRAPES) ×3 IMPLANT
DRAPE INCISE IOBAN 66X45 STRL (DRAPES) ×3 IMPLANT
DRAPE U-SHAPE 47X51 STRL (DRAPES) ×3 IMPLANT
DURAPREP 26ML APPLICATOR (WOUND CARE) ×3 IMPLANT
ELECT REM PT RETURN 9FT ADLT (ELECTROSURGICAL) ×3
ELECTRODE REM PT RTRN 9FT ADLT (ELECTROSURGICAL) ×1 IMPLANT
FACESHIELD WRAPAROUND (MASK) ×3 IMPLANT
GAUZE SPONGE 4X4 12PLY STRL (GAUZE/BANDAGES/DRESSINGS) ×3 IMPLANT
GAUZE XEROFORM 1X8 LF (GAUZE/BANDAGES/DRESSINGS) ×3 IMPLANT
GLOVE BIO SURGEON STRL SZ 6.5 (GLOVE) ×2 IMPLANT
GLOVE BIO SURGEONS STRL SZ 6.5 (GLOVE) ×1
GLOVE BIOGEL PI IND STRL 7.0 (GLOVE) ×4 IMPLANT
GLOVE BIOGEL PI INDICATOR 7.0 (GLOVE) ×8
GLOVE ECLIPSE 7.0 STRL STRAW (GLOVE) ×3 IMPLANT
GLOVE SURG ORTHO 8.0 STRL STRW (GLOVE) ×3 IMPLANT
GLOVE SURG SS PI 6.5 STRL IVOR (GLOVE) ×3 IMPLANT
GOWN STRL REUS W/ TWL LRG LVL3 (GOWN DISPOSABLE) ×2 IMPLANT
GOWN STRL REUS W/ TWL XL LVL3 (GOWN DISPOSABLE) ×2 IMPLANT
GOWN STRL REUS W/TWL LRG LVL3 (GOWN DISPOSABLE) ×4
GOWN STRL REUS W/TWL XL LVL3 (GOWN DISPOSABLE) ×4
HANDPIECE INTERPULSE COAX TIP (DISPOSABLE) ×2
IMMOBILIZER KNEE 22 UNIV (SOFTGOODS) ×3 IMPLANT
IMMOBILIZER KNEE 24 THIGH 36 (MISCELLANEOUS) IMPLANT
IMMOBILIZER KNEE 24 UNIV (MISCELLANEOUS)
KNEE CAPITATED PARTIAL 2 ×1 IMPLANT
KNEE WRAP E Z 3 GEL PACK (MISCELLANEOUS) ×3 IMPLANT
MANIFOLD NEPTUNE II (INSTRUMENTS) ×3 IMPLANT
NDL SAFETY ECLIPSE 18X1.5 (NEEDLE) ×1 IMPLANT
NEEDLE HYPO 18GX1.5 SHARP (NEEDLE) ×2
NS IRRIG 1000ML POUR BTL (IV SOLUTION) ×3 IMPLANT
PACK ARTHROSCOPY DSU (CUSTOM PROCEDURE TRAY) ×3 IMPLANT
PACK BASIN DAY SURGERY FS (CUSTOM PROCEDURE TRAY) ×3 IMPLANT
PADDING CAST COTTON 6X4 STRL (CAST SUPPLIES) ×3 IMPLANT
PENCIL BUTTON HOLSTER BLD 10FT (ELECTRODE) ×3 IMPLANT
PILLOW KNEE EXTENSION 0 DEG (MISCELLANEOUS) ×3 IMPLANT
SET HNDPC FAN SPRY TIP SCT (DISPOSABLE) ×1 IMPLANT
SHEET MEDIUM DRAPE 40X70 STRL (DRAPES) IMPLANT
SLEEVE SCD COMPRESS KNEE MED (MISCELLANEOUS) ×3 IMPLANT
SPONGE LAP 18X18 X RAY DECT (DISPOSABLE) ×3 IMPLANT
STRIP CLOSURE SKIN 1/2X4 (GAUZE/BANDAGES/DRESSINGS) ×2 IMPLANT
SUCTION FRAZIER HANDLE 10FR (MISCELLANEOUS) ×2
SUCTION TUBE FRAZIER 10FR DISP (MISCELLANEOUS) ×1 IMPLANT
SUT FIBERWIRE #2 38 T-5 BLUE (SUTURE)
SUT MNCRL AB 4-0 PS2 18 (SUTURE) IMPLANT
SUT VIC AB 0 CT1 27 (SUTURE)
SUT VIC AB 0 CT1 27XBRD ANBCTR (SUTURE) IMPLANT
SUT VIC AB 0 SH 27 (SUTURE) IMPLANT
SUT VIC AB 1 CT1 27 (SUTURE) ×2
SUT VIC AB 1 CT1 27XBRD ANBCTR (SUTURE) ×1 IMPLANT
SUT VIC AB 2-0 SH 27 (SUTURE) ×4
SUT VIC AB 2-0 SH 27XBRD (SUTURE) ×2 IMPLANT
SUTURE FIBERWR #2 38 T-5 BLUE (SUTURE) IMPLANT
SYR 50ML LL SCALE MARK (SYRINGE) ×3 IMPLANT
SYR BULB 3OZ (MISCELLANEOUS) ×3 IMPLANT
TOWEL OR 17X24 6PK STRL BLUE (TOWEL DISPOSABLE) ×3 IMPLANT
TOWEL OR NON WOVEN STRL DISP B (DISPOSABLE) ×6 IMPLANT
TUBE CONNECTING 20'X1/4 (TUBING)
TUBE CONNECTING 20X1/4 (TUBING) IMPLANT
YANKAUER SUCT BULB TIP NO VENT (SUCTIONS) ×3 IMPLANT

## 2016-04-17 NOTE — Anesthesia Procedure Notes (Signed)
Anesthesia Regional Block: Adductor canal block   Pre-Anesthetic Checklist: ,, timeout performed, Correct Patient, Correct Site, Correct Laterality, Correct Procedure, Correct Position, site marked, Risks and benefits discussed,  Surgical consent,  Pre-op evaluation,  At surgeon's request and post-op pain management  Laterality: Right  Prep: chloraprep       Needles:  Injection technique: Single-shot  Needle Type: Stimiplex     Needle Length: 9cm  Needle Gauge: 21     Additional Needles:   Procedures: ultrasound guided,,,,,,,,  Narrative:  Start time: 04/17/2016 7:13 AM End time: 04/17/2016 7:16 AM Injection made incrementally with aspirations every 5 mL.  Performed by: Personally  Anesthesiologist: Nolon Nations  Additional Notes: BP cuff, EKG monitors applied. Sedation begun. Artery and nerve location verified with U/S and anesthetic injected incrementally, slowly, and after negative aspirations under direct u/s guidance. Good fascial /perineural spread. Tolerated well.     a

## 2016-04-17 NOTE — Anesthesia Procedure Notes (Deleted)
Performed by: Aijalon Kirtz S       

## 2016-04-17 NOTE — Transfer of Care (Signed)
Immediate Anesthesia Transfer of Care Note  Patient: Jasmine Hahn  Procedure(s) Performed: Procedure(s): RIGHT KNEE UNICOMPARTMENTAL ARTHROPLASTY (Right)  Patient Location: PACU  Anesthesia Type:GA combined with regional for post-op pain  Level of Consciousness: awake, sedated and patient cooperative  Airway & Oxygen Therapy: Patient Spontanous Breathing and Patient connected to face mask oxygen  Post-op Assessment: Report given to RN and Post -op Vital signs reviewed and stable  Post vital signs: Reviewed and stable  Last Vitals:  Vitals:   04/17/16 0721 04/17/16 0954  BP:    Pulse: 89 (!) 104  Resp: 17   Temp:      Last Pain:  Vitals:   04/17/16 0643  TempSrc: Oral  PainSc: 10-Worst pain ever      Patients Stated Pain Goal: 2 (58/59/29 2446)  Complications: No apparent anesthesia complications

## 2016-04-17 NOTE — Addendum Note (Signed)
Addendum  created 04/17/16 1159 by Nolon Nations, MD   Sign clinical note, SmartForm saved

## 2016-04-17 NOTE — Anesthesia Procedure Notes (Addendum)
Procedure Name: LMA Insertion Date/Time: 04/17/2016 7:51 AM Performed by: Lyndee Leo Pre-anesthesia Checklist: Patient identified, Emergency Drugs available, Suction available and Patient being monitored Patient Re-evaluated:Patient Re-evaluated prior to inductionOxygen Delivery Method: Circle system utilized Preoxygenation: Pre-oxygenation with 100% oxygen Intubation Type: IV induction Ventilation: Mask ventilation without difficulty LMA: LMA inserted LMA Size: 4.0 Number of attempts: 1 Airway Equipment and Method: Bite block Placement Confirmation: positive ETCO2 Tube secured with: Tape Dental Injury: Teeth and Oropharynx as per pre-operative assessment

## 2016-04-17 NOTE — Discharge Instructions (Signed)
INSTRUCTIONS AFTER JOINT REPLACEMENT  ° °o Remove items at home which could result in a fall. This includes throw rugs or furniture in walking pathways °o ICE to the affected joint every three hours while awake for 30 minutes at a time, for at least the first 3-5 days, and then as needed for pain and swelling.  Continue to use ice for pain and swelling. You may notice swelling that will progress down to the foot and ankle.  This is normal after surgery.  Elevate your leg when you are not up walking on it.   °o Continue to use the breathing machine you got in the hospital (incentive spirometer) which will help keep your temperature down.  It is common for your temperature to cycle up and down following surgery, especially at night when you are not up moving around and exerting yourself.  The breathing machine keeps your lungs expanded and your temperature down. ° ° °DIET:  As you were doing prior to hospitalization, we recommend a well-balanced diet. ° °DRESSING / WOUND CARE / SHOWERING ° °Keep the surgical dressing until follow up.  The dressing is water proof, so you can shower without any extra covering.  IF THE DRESSING FALLS OFF or the wound gets wet inside, change the dressing with sterile gauze.  Please use good hand washing techniques before changing the dressing.  Do not use any lotions or creams on the incision until instructed by your surgeon.   ° °ACTIVITY ° °o Increase activity slowly as tolerated, but follow the weight bearing instructions below.   °o No driving for 6 weeks or until further direction given by your physician.  You cannot drive while taking narcotics.  °o No lifting or carrying greater than 10 lbs. until further directed by your surgeon. °o Avoid periods of inactivity such as sitting longer than an hour when not asleep. This helps prevent blood clots.  °o You may return to work once you are authorized by your doctor.  ° ° ° °WEIGHT BEARING  ° °Weight bearing as tolerated with assist  device (walker, cane, etc) as directed, use it as long as suggested by your surgeon or therapist, typically at least 4-6 weeks. ° ° °EXERCISES ° °Results after joint replacement surgery are often greatly improved when you follow the exercise, range of motion and muscle strengthening exercises prescribed by your doctor. Safety measures are also important to protect the joint from further injury. Any time any of these exercises cause you to have increased pain or swelling, decrease what you are doing until you are comfortable again and then slowly increase them. If you have problems or questions, call your caregiver or physical therapist for advice.  ° °Rehabilitation is important following a joint replacement. After just a few days of immobilization, the muscles of the leg can become weakened and shrink (atrophy).  These exercises are designed to build up the tone and strength of the thigh and leg muscles and to improve motion. Often times heat used for twenty to thirty minutes before working out will loosen up your tissues and help with improving the range of motion but do not use heat for the first two weeks following surgery (sometimes heat can increase post-operative swelling).  ° °These exercises can be done on a training (exercise) mat, on the floor, on a table or on a bed. Use whatever works the best and is most comfortable for you.    Use music or television while you are exercising so that   the exercises are a pleasant break in your day. This will make your life better with the exercises acting as a break in your routine that you can look forward to.   Perform all exercises about fifteen times, three times per day or as directed.  You should exercise both the operative leg and the other leg as well. ° °Exercises include: °  °• Quad Sets - Tighten up the muscle on the front of the thigh (Quad) and hold for 5-10 seconds.   °• Straight Leg Raises - With your knee straight (if you were given a brace, keep it on),  lift the leg to 60 degrees, hold for 3 seconds, and slowly lower the leg.  Perform this exercise against resistance later as your leg gets stronger.  °• Leg Slides: Lying on your back, slowly slide your foot toward your buttocks, bending your knee up off the floor (only go as far as is comfortable). Then slowly slide your foot back down until your leg is flat on the floor again.  °• Angel Wings: Lying on your back spread your legs to the side as far apart as you can without causing discomfort.  °• Hamstring Strength:  Lying on your back, push your heel against the floor with your leg straight by tightening up the muscles of your buttocks.  Repeat, but this time bend your knee to a comfortable angle, and push your heel against the floor.  You may put a pillow under the heel to make it more comfortable if necessary.  ° °A rehabilitation program following joint replacement surgery can speed recovery and prevent re-injury in the future due to weakened muscles. Contact your doctor or a physical therapist for more information on knee rehabilitation.  ° ° °CONSTIPATION ° °Constipation is defined medically as fewer than three stools per week and severe constipation as less than one stool per week.  Even if you have a regular bowel pattern at home, your normal regimen is likely to be disrupted due to multiple reasons following surgery.  Combination of anesthesia, postoperative narcotics, change in appetite and fluid intake all can affect your bowels.  ° °YOU MUST use at least one of the following options; they are listed in order of increasing strength to get the job done.  They are all available over the counter, and you may need to use some, POSSIBLY even all of these options:   ° °Drink plenty of fluids (prune juice may be helpful) and high fiber foods °Colace 100 mg by mouth twice a day  °Senokot for constipation as directed and as needed Dulcolax (bisacodyl), take with full glass of water  °Miralax (polyethylene glycol)  once or twice a day as needed. ° °If you have tried all these things and are unable to have a bowel movement in the first 3-4 days after surgery call either your surgeon or your primary doctor.   ° °If you experience loose stools or diarrhea, hold the medications until you stool forms back up.  If your symptoms do not get better within 1 week or if they get worse, check with your doctor.  If you experience "the worst abdominal pain ever" or develop nausea or vomiting, please contact the office immediately for further recommendations for treatment. ° ° °ITCHING:  If you experience itching with your medications, try taking only a single pain pill, or even half a pain pill at a time.  You can also use Benadryl over the counter for itching or also to   help with sleep.   TED HOSE STOCKINGS:  Use stockings on both legs until for at least 2 weeks or as directed by physician office. They may be removed at night for sleeping.  MEDICATIONS:  See your medication summary on the After Visit Summary that nursing will review with you.  You may have some home medications which will be placed on hold until you complete the course of blood thinner medication.  It is important for you to complete the blood thinner medication as prescribed.  PRECAUTIONS:  If you experience chest pain or shortness of breath - call 911 immediately for transfer to the hospital emergency department.   If you develop a fever greater that 101 F, purulent drainage from wound, increased redness or drainage from wound, foul odor from the wound/dressing, or calf pain - CONTACT YOUR SURGEON.                                                   FOLLOW-UP APPOINTMENTS:  If you do not already have a post-op appointment, please call the office for an appointment to be seen by your surgeon.  Guidelines for how soon to be seen are listed in your After Visit Summary, but are typically between 1-4 weeks after surgery.  OTHER INSTRUCTIONS:   Knee  Replacement:  Do not place pillow under knee, focus on keeping the knee straight while resting. CPM instructions: 0-90 degrees, 2 hours in the morning, 2 hours in the afternoon, and 2 hours in the evening. Place foam block, curve side up under heel at all times except when in CPM or when walking.  DO NOT modify, tear, cut, or change the foam block in any way.  MAKE SURE YOU:   Understand these instructions.   Get help right away if you are not doing well or get worse.    Thank you for letting us be a part of your medical care team.  It is a privilege we respect greatly.  We hope these instructions will help you stay on track for a fast and full recovery!      Regional Anesthesia Blocks  1. Numbness or the inability to move the "blocked" extremity may last from 3-48 hours after placement. The length of time depends on the medication injected and your individual response to the medication. If the numbness is not going away after 48 hours, call your surgeon.  2. The extremity that is blocked will need to be protected until the numbness is gone and the  Strength has returned. Because you cannot feel it, you will need to take extra care to avoid injury. Because it may be weak, you may have difficulty moving it or using it. You may not know what position it is in without looking at it while the block is in effect.  3. For blocks in the legs and feet, returning to weight bearing and walking needs to be done carefully. You will need to wait until the numbness is entirely gone and the strength has returned. You should be able to move your leg and foot normally before you try and bear weight or walk. You will need someone to be with you when you first try to ensure you do not fall and possibly risk injury.  4. Bruising and tenderness at the needle site are common side effects and will  resolve in a few days.  5. Persistent numbness or new problems with movement should be communicated to the surgeon or  the [place] [phone] [place] [phone]).      Post Anesthesia Home Care Instructions  Activity: Get plenty of rest for the remainder of the day. A responsible individual must stay with you for 24 hours following the procedure.  For the next 24 hours, DO NOT: -Drive a car -Paediatric nurse -Drink alcoholic beverages -Take any medication unless instructed by your physician -Make any legal decisions or sign important papers.  Meals: Start with liquid foods such as gelatin or soup. Progress to regular foods as tolerated. Avoid greasy, spicy, heavy foods. If nausea and/or vomiting occur, drink only clear liquids until the nausea and/or vomiting subsides. Call your physician if vomiting continues.  Special Instructions/Symptoms: Your throat may feel dry or sore from the anesthesia or the breathing tube placed in your throat during surgery. If this causes discomfort, gargle with warm salt water. The discomfort should disappear within 24 hours.  If you had a scopolamine patch placed behind your ear for the management of post- operative nausea and/or vomiting:  1. The medication in the patch is effective for 72 hours, after which it should be removed.  Wrap patch in a tissue and discard in the trash. Wash hands thoroughly with soap and water. 2. You may remove the patch earlier than 72 hours if you experience unpleasant side effects which may include dry mouth, dizziness or visual disturbances. 3. Avoid touching the patch. Wash your hands with soap and water after contact with the patch.

## 2016-04-17 NOTE — Anesthesia Postprocedure Evaluation (Signed)
Anesthesia Post Note  Patient: Jasmine Hahn  Procedure(s) Performed: Procedure(s) (LRB): RIGHT KNEE UNICOMPARTMENTAL ARTHROPLASTY (Right)  Patient location during evaluation: PACU Anesthesia Type: General and Regional Level of consciousness: sedated and patient cooperative Pain management: pain level controlled Vital Signs Assessment: post-procedure vital signs reviewed and stable Respiratory status: spontaneous breathing Cardiovascular status: stable Anesthetic complications: no       Last Vitals:  Vitals:   04/17/16 1145 04/17/16 1146  BP:    Pulse: 96 100  Resp:    Temp:      Last Pain:  Vitals:   04/17/16 1139  TempSrc: Oral  PainSc: 2         RLE Motor Response: Purposeful movement (04/17/16 1143) RLE Sensation: Full sensation (04/17/16 1143)      Nolon Nations

## 2016-04-17 NOTE — Interval H&P Note (Signed)
History and Physical Interval Note:  04/17/2016 7:38 AM  Jasmine Hahn  has presented today for surgery, with the diagnosis of UNILATERAL PRIMARY OSTEOARTHRITIS, RIGHT KNEE  The various methods of treatment have been discussed with the patient and family. After consideration of risks, benefits and other options for treatment, the patient has consented to  Procedure(s): RIGHT KNEE UNICOMPARTMENTAL ARTHROPLASTY (Right) as a surgical intervention .  The patient's history has been reviewed, patient examined, no change in status, stable for surgery.  I have reviewed the patient's chart and labs.  Questions were answered to the patient's satisfaction.     Ninetta Lights

## 2016-04-17 NOTE — Progress Notes (Signed)
Assisted Dr. Lissa Hoard with right, ultrasound guided, knee block. Side rails up, monitors on throughout procedure. See vital signs in flow sheet. Tolerated Procedure well.

## 2016-04-21 ENCOUNTER — Encounter (HOSPITAL_BASED_OUTPATIENT_CLINIC_OR_DEPARTMENT_OTHER): Payer: Self-pay | Admitting: Orthopedic Surgery

## 2016-04-21 NOTE — Op Note (Signed)
NAME:  Jasmine Hahn, Jasmine Hahn                     ACCOUNT NO.:  MEDICAL RECORD NO.:  [id number]  LOCATION:                                 FACILITY:  PHYSICIAN:  Ninetta Lights, M.D.      DATE OF BIRTH:  DATE OF PROCEDURE:  04/17/2016 DATE OF DISCHARGE:                              OPERATIVE REPORT   PREOPERATIVE DIAGNOSIS:  End-stage arthritis, primary, localized, medial compartment, right knee.  POSTOPERATIVE DIAGNOSIS:  End-stage arthritis, primary, localized, medial compartment, right knee.  PROCEDURE:  Unicompartmental replacement, medial compartment, right knee with a Stryker PKR prosthesis.  #3 femur.  #3 tibia with an 8-mm polyethylene insert.  SURGEON:  Ninetta Lights, M.D.  ASSISTANT:  Elmyra Ricks.  Present throughout the entire case and necessary for timely completion of procedure.  ANESTHESIA:  General.  BLOOD LOSS:  Minimal.  SPECIMENS:  None.  CULTURES:  None.  COMPLICATIONS:  None.  DRESSINGS:  Soft compressive knee immobilizer.  TOURNIQUET TIME:  1 hour 30 minutes.  DESCRIPTION OF PROCEDURE:  The patient was brought to the operating room, and after adequate anesthesia had been obtained, right knee examined.  Correctable varus.  Full extension.  Tourniquet applied. Prepped and draped in usual sterile fashion.  Exsanguinated with elevation of Esmarch.  Tourniquet inflated to 300 mmHg.  Longitudinal incision medial to the patella and the patellar tendon down over the tibia.  Medial arthrotomy.  What was left of the medial meniscus was debrided.  The other compartments looked good.  I did a little bit of the medial capsule release.  Utilizing Stryker instrumentation, I did a vertical cut next to the tibial spine and then transverse cut excising out the medial tibial plateau.  Marked grade 4 changes on the femur where there was a full-thickness defect.  Utilizing guides, I had made the distal chamfer and anterior cuts on the femur.  After  appropriate trials, wound was thoroughly irrigated.  The PEG holes were drilled on both the tibia and femur.  Cement prepared, placed on all components which were firmly seated and hammered into place.  The polyethylene attached to tibia, knee reduced.  Once the cement hardened, the knee was cleared out again to be sure all fragments of cement were removed.  She had full extension, full flexion, nicely balanced knee, correction of slight varus.  Soft tissue was injected with Exparel.  Arthrotomy closed with #1 Vicryl with subcutaneous subcuticular closure of the wound.  Sterile compressive dressing applied.  Tourniquet deflated and removed.  Knee immobilizer applied. Anesthesia reversed.  Brought to the recovery room.  Tolerated the surgery well.  No complications.     Ninetta Lights, M.D.     DFM/MEDQ  D:  04/17/2016  T:  04/17/2016  Job:  [id number]

## 2016-07-17 DIAGNOSIS — Z96651 Presence of right artificial knee joint: Secondary | ICD-10-CM | POA: Insufficient documentation

## 2016-07-17 HISTORY — DX: Presence of right artificial knee joint: Z96.651

## 2016-11-11 ENCOUNTER — Other Ambulatory Visit: Payer: Self-pay | Admitting: Obstetrics and Gynecology

## 2016-11-11 DIAGNOSIS — N644 Mastodynia: Secondary | ICD-10-CM

## 2016-11-14 ENCOUNTER — Ambulatory Visit
Admission: RE | Admit: 2016-11-14 | Discharge: 2016-11-14 | Disposition: A | Discharge status: Home / Self Care | Payer: 59 | Source: Ambulatory Visit | Attending: Obstetrics and Gynecology | Admitting: Obstetrics and Gynecology

## 2016-11-14 ENCOUNTER — Ambulatory Visit: Payer: 59

## 2016-11-14 DIAGNOSIS — N644 Mastodynia: Secondary | ICD-10-CM

## 2016-12-17 ENCOUNTER — Other Ambulatory Visit (HOSPITAL_COMMUNITY): Payer: Self-pay | Admitting: Orthopedic Surgery

## 2016-12-17 DIAGNOSIS — M25561 Pain in right knee: Secondary | ICD-10-CM

## 2016-12-24 ENCOUNTER — Encounter (HOSPITAL_COMMUNITY): Payer: 59

## 2016-12-24 ENCOUNTER — Ambulatory Visit (HOSPITAL_COMMUNITY)
Admission: RE | Admit: 2016-12-24 | Discharge: 2016-12-24 | Disposition: A | Discharge status: Home / Self Care | Payer: 59 | Source: Ambulatory Visit | Attending: Orthopedic Surgery | Admitting: Orthopedic Surgery

## 2016-12-24 DIAGNOSIS — M25561 Pain in right knee: Secondary | ICD-10-CM | POA: Diagnosis present

## 2016-12-24 MED ORDER — TECHNETIUM TC 99M MEDRONATE IV KIT
25.0000 | PACK | Freq: Once | INTRAVENOUS | Status: AC | PRN
  Administered 2016-12-24: 25 via INTRAVENOUS

## 2017-02-02 ENCOUNTER — Encounter: Payer: Self-pay | Admitting: Internal Medicine

## 2017-07-29 DIAGNOSIS — Z87891 Personal history of nicotine dependence: Secondary | ICD-10-CM

## 2017-07-29 DIAGNOSIS — Z79890 Hormone replacement therapy (postmenopausal): Secondary | ICD-10-CM | POA: Insufficient documentation

## 2017-07-29 DIAGNOSIS — E890 Postprocedural hypothyroidism: Secondary | ICD-10-CM | POA: Insufficient documentation

## 2017-07-29 HISTORY — DX: Personal history of nicotine dependence: Z87.891

## 2017-07-29 HISTORY — DX: Hormone replacement therapy: Z79.890

## 2017-07-29 HISTORY — DX: Postprocedural hypothyroidism: E89.0

## 2017-09-01 DIAGNOSIS — Z96651 Presence of right artificial knee joint: Secondary | ICD-10-CM | POA: Insufficient documentation

## 2017-09-01 DIAGNOSIS — Z471 Aftercare following joint replacement surgery: Secondary | ICD-10-CM

## 2017-09-01 HISTORY — DX: Presence of right artificial knee joint: Z96.651

## 2017-09-01 HISTORY — DX: Aftercare following joint replacement surgery: Z47.1

## 2017-11-23 ENCOUNTER — Encounter: Payer: Self-pay | Admitting: *Deleted

## 2017-11-23 ENCOUNTER — Other Ambulatory Visit: Payer: Self-pay | Admitting: *Deleted

## 2017-11-24 ENCOUNTER — Ambulatory Visit (INDEPENDENT_AMBULATORY_CARE_PROVIDER_SITE_OTHER): Payer: 59 | Admitting: Cardiology

## 2017-11-24 ENCOUNTER — Encounter: Payer: Self-pay | Admitting: Cardiology

## 2017-11-24 VITALS — BP 116/60 | HR 60 | Ht 60.0 in | Wt 136.8 lb

## 2017-11-24 DIAGNOSIS — R55 Syncope and collapse: Secondary | ICD-10-CM

## 2017-11-24 DIAGNOSIS — R002 Palpitations: Secondary | ICD-10-CM

## 2017-11-24 DIAGNOSIS — E782 Mixed hyperlipidemia: Secondary | ICD-10-CM | POA: Diagnosis not present

## 2017-11-24 HISTORY — DX: Palpitations: R00.2

## 2017-11-24 NOTE — Patient Instructions (Signed)
Medication Instructions:  Your physician recommends that you continue on your current medications as directed. Please refer to the Current Medication list given to you today.  If you need a refill on your cardiac medications before your next appointment, please call your pharmacy.   Lab work: None.  If you have labs (blood work) drawn today and your tests are completely normal, you will receive your results only by: Marland Kitchen MyChart Message (if you have MyChart) OR . A paper copy in the mail If you have any lab test that is abnormal or we need to change your treatment, we will call you to review the results.  Testing/Procedures: Your physician has requested that you have an echocardiogram. Echocardiography is a painless test that uses sound waves to create images of your heart. It provides your doctor with information about the size and shape of your heart and how well your heart's chambers and valves are working. This procedure takes approximately one hour. There are no restrictions for this procedure.   Your physician has recommended that you wear a holter monitor. Holter monitors are medical devices that record the heart's electrical activity. Doctors most often use these monitors to diagnose arrhythmias. Arrhythmias are problems with the speed or rhythm of the heartbeat. The monitor is a small, portable device. You can wear one while you do your normal daily activities. This is usually used to diagnose what is causing palpitations/syncope (passing out). Wear for 14 days       Follow-Up: At [facility], you and your health needs are our priority.  As part of our continuing mission to provide you with exceptional heart care, we have created designated Provider Care Teams.  These Care Teams include your primary Cardiologist (physician) and Advanced Practice Providers (APPs -  Physician Assistants and Nurse Practitioners) who all work together to provide you with the care you need, when you need  it. You will need a follow up appointment in 6 weeks.  Please call our office 2 months in advance to schedule this appointment.  You may see No primary care provider on file. or another member of our [employer] Provider Team in [place]: Shirlee More, MD . Jyl Heinz, MD  Any Other Special Instructions Will Be Listed Below (If Applicable).  Echocardiogram An echocardiogram, or echocardiography, uses sound waves (ultrasound) to produce an image of your heart. The echocardiogram is simple, painless, obtained within a short period of time, and offers valuable information to your health care provider. The images from an echocardiogram can provide information such as:  Evidence of coronary artery disease (CAD).  Heart size.  Heart muscle function.  Heart valve function.  Aneurysm detection.  Evidence of a past heart attack.  Fluid buildup around the heart.  Heart muscle thickening.  Assess heart valve function.  Tell a health care provider about:  Any allergies you have.  All medicines you are taking, including vitamins, herbs, eye drops, creams, and over-the-counter medicines.  Any problems you or family members have had with anesthetic medicines.  Any blood disorders you have.  Any surgeries you have had.  Any medical conditions you have.  Whether you are pregnant or may be pregnant. What happens before the procedure? No special preparation is needed. Eat and drink normally. What happens during the procedure?  In order to produce an image of your heart, gel will be applied to your chest and a wand-like tool (transducer) will be moved over your chest. The gel will help transmit the  sound waves from the transducer. The sound waves will harmlessly bounce off your heart to allow the heart images to be captured in real-time motion. These images will then be recorded.  You may need an IV to receive a medicine that improves the quality of the pictures. What happens  after the procedure? You may return to your normal schedule including diet, activities, and medicines, unless your health care provider tells you otherwise. This information is not intended to replace advice given to you by your health care provider. Make sure you discuss any questions you have with your health care provider. Document Released: 01/04/2000 Document Revised: 08/25/2015 Document Reviewed: 09/13/2012 Elsevier Interactive Patient Education  2017 [place].   Holter Monitoring A Holter monitor is a small device that is used to detect abnormal heart rhythms. It clips to your clothing and is connected by wires to flat, sticky disks (electrodes) that attach to your chest. It is worn continuously for 24-48 hours. Follow these instructions at home:  Wear your Holter monitor at all times, even while exercising and sleeping, for as long as directed by your health care provider.  Make sure that the Holter monitor is safely clipped to your clothing or close to your body as recommended by your health care provider.  Do not get the monitor or wires wet.  Do not put body lotion or moisturizer on your chest.  Keep your skin clean.  Keep a diary of your daily activities, such as walking and doing chores. If you feel that your heartbeat is abnormal or that your heart is fluttering or skipping a beat: ? Record what you are doing when it happens. ? Record what time of day the symptoms occur.  Return your Holter monitor as directed by your health care provider.  Keep all follow-up visits as directed by your health care provider. This is important. Get help right away if:  You feel lightheaded or you faint.  You have trouble breathing.  You feel pain in your chest, upper arm, or jaw.  You feel sick to your stomach and your skin is pale, cool, or damp.  You heartbeat feels unusual or abnormal. This information is not intended to replace advice given to you by your health care  provider. Make sure you discuss any questions you have with your health care provider. Document Released: 10/05/2003 Document Revised: 06/14/2015 Document Reviewed: 08/15/2013 Elsevier Interactive Patient Education  [employer].

## 2017-11-24 NOTE — Progress Notes (Signed)
Cardiology Consultation:    Date:  11/24/2017   ID:  Jasmine Hahn, DOB 08-Mar-1960, MRN [id number]  PCP:  Nonnie Done., MD  Cardiologist:  Gypsy Balsam, MD   Referring MD: Nonnie Done., MD   Chief Complaint  Patient presents with  . Follow-up  I have palpitations  History of Present Illness:    Jasmine Hahn is a 57 y.o. female who is being seen today for the evaluation of palpitations at the request of [place], Excell Seltzer., MD.  I seen her more than 4 years ago the problem at that time with palpitations.  She apparently wear monitor but only for 3 days and nothing was fine.  She started experiencing some problem again she described the situation when she is trying to lay down at evening time she will feel her heart spitting up typical gradual onset gradual offset.  No chest pain tightness squeezing pressure burning chest associated with this sensation just anxiety feeling.  She also described a situation that she wakes up in the middle of the night with pain in the epigastrium she is known to have GERD she takes a medication for it with good response but still in spite of that management she still experience sometimes chest pain.  Few times when she woke up in the middle of the night with the pain like this she would sweat and then she would get up and go to the restroom and passed out on the way.  She tells me that one time she was out for about 2 hours.  Denies having any dizziness or passing out during the day. She did have knee surgery which was some problematic she eventually required surgery to be redone and now she is happy with it she is able to walk and dance.  She likes dancing she does twice a week.  No difficulty doing it.  Past Medical History:  Diagnosis Date  . Aftercare following right knee joint replacement surgery 09/01/2017  . ALLERGIC RHINITIS, SEASONAL 06/26/2006   Qualifier: Diagnosis of  By: Drue Novel MD, Nolon Rod.   . Allergy   . Arthritis   . Discoid lupus   .  FATIGUE 08/23/2007   Qualifier: Diagnosis of  By: Janit Bern    . Former tobacco use 07/29/2017  . GERD 06/26/2006   Qualifier: Diagnosis of  By: Drue Novel MD, Nolon Rod.   . GERD (gastroesophageal reflux disease)   . History of hiatal hernia   . 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 Hormone replacement therapy (HRT) 07/29/2017  . Hyperlipidemia   . Hypothyroidism   . HYPOTHYROIDISM 06/26/2006   Qualifier: Diagnosis of  By: Drue Novel MD, Nolon Rod.   . Hypothyroidism, postsurgical 07/29/2017  . INSOMNIA 06/26/2006   Qualifier: Diagnosis of  By: Drue Novel MD, Nolon Rod.   . MAXILLARY SINUSITIS 06/26/2006   Qualifier: Diagnosis of  By: Drue Novel MD, Nolon Rod.   . Primary localized osteoarthritis of right knee 04/17/2016  . SINUSITIS- ACUTE-NOS 12/29/2006   Qualifier: Diagnosis of  By: Drue Novel MD, Jose E.   . Syncope    not sure why  . Thyroid disease    hypo  . URI 08/23/2007   Qualifier: Diagnosis of  By: Janit Bern    . Wears partial dentures     Past Surgical History:  Procedure Laterality Date  . ABDOMINAL HYSTERECTOMY    . bone spurs     feet-both feet  . CHOLECYSTECTOMY    .  COLONOSCOPY    . ELBOW SURGERY     Dr Zachary George  . ESOPHAGOGASTRODUODENOSCOPY  02/2011   Medoff, NEG  . KNEE ARTHROSCOPY WITH DRILLING/MICROFRACTURE Right 05/25/2014   Procedure: KNEE ARTHROSCOPY WITH MICROFRACTURE PATELLA FEMORAL CONDYLE;  Surgeon: Mckinley Jewel, MD;  Location: [facility];  Service: Orthopedics;  Laterality: Right;  . KNEE ARTHROSCOPY WITH MEDIAL MENISECTOMY Right 05/25/2014   Procedure: RIGHT KNEE ARTHROSCOPY WITH PARTIAL MEDIAL;  Surgeon: Mckinley Jewel, MD;  Location: [facility];  Service: Orthopedics;  Laterality: Right;  . LYSIS OF ADHESION Right 05/25/2014   Procedure: LYSIS OF ADHESION;  Surgeon: Mckinley Jewel, MD;  Location: [facility];  Service: Orthopedics;  Laterality: Right;  . OOPHORECTOMY    . PARTIAL KNEE ARTHROPLASTY Right 04/17/2016     Procedure: RIGHT KNEE UNICOMPARTMENTAL ARTHROPLASTY;  Surgeon: Loreta Ave, MD;  Location: [facility];  Service: Orthopedics;  Laterality: Right;  . THYROIDECTOMY     x 2 surgeries - partial first time  . TUBAL LIGATION      Current Medications: Current Meds  Medication Sig  . Ascorbic Acid (VITAMIN C) 1000 MG tablet Take 1,000 mg by mouth daily.  Marland Kitchen BIOTIN PO Take 1 tablet by mouth daily.  . cetirizine (ZYRTEC) 10 MG tablet Take 10 mg by mouth daily.  . Cyanocobalamin (VITAMIN B-12 PO) Take 1 tablet by mouth daily.  . fluticasone (CUTIVATE) 0.05 % cream Apply 1 application topically as needed. Spot on face  . levothyroxine (SYNTHROID, LEVOTHROID) 150 MCG tablet Take by mouth.  . Multiple Vitamins-Minerals (CENTRUM SILVER ADULT 50+ PO) Take 1 tablet by mouth daily.   . Omega-3 Fatty Acids (RA FISH OIL) 1000 MG CAPS Take by mouth.  . ranitidine (ZANTAC) 300 MG tablet TAKE TWICE A DAY WHILE TRYING TO GET OFF PPI, THEN CAN GO TO ONCE AT NIGHT  . valACYclovir (VALTREX) 1000 MG tablet Take 1,000 mg by mouth daily as needed (fever blisters).   . VIVELLE-DOT 0.1 MG/24HR patch Place 1 patch onto the skin 2 (two) times a week.   . zolpidem (AMBIEN) 10 MG tablet Take 10 mg by mouth at bedtime.      Allergies:   Dog epithelium allergy skin test; Other; Pollen extract; and Prednisone   Social History   Socioeconomic History  . Marital status: Single    Spouse name: Not on file  . Number of children: Not on file  . Years of education: Not on file  . Highest education level: Not on file  Occupational History  . Not on file  Social Needs  . Financial resource strain: Not on file  . Food insecurity:    Worry: Not on file    Inability: Not on file  . Transportation needs:    Medical: Not on file    Non-medical: Not on file  Tobacco Use  . Smoking status: Former Smoker    Packs/day: 0.50    Years: 30.00    Pack years: 15.00    Last attempt to quit: 05/21/2011     Years since quitting: 6.5  . Smokeless tobacco: Never Used  Substance and Sexual Activity  . Alcohol use: Never    Frequency: Never  . Drug use: Yes    Types: Marijuana  . Sexual activity: Not on file  Lifestyle  . Physical activity:    Days per week: Not on file    Minutes per session: Not on file  . Stress: Not on file  Relationships  . Social connections:    Talks on phone: Not on file    Gets together: Not on file    Attends religious service: Not on file    Active member of club or organization: Not on file    Attends meetings of clubs or organizations: Not on file    Relationship status: Not on file  Other Topics Concern  . Not on file  Social History Narrative  . Not on file     Family History: The patient's family history includes COPD in her maternal grandfather; Cancer in her father; Cancer (age of onset: 56) in her brother; Diabetes in her mother; Heart disease in her maternal grandmother and mother; Hyperlipidemia in her mother; Hypertension in her mother; Hypothyroidism in her mother; Stroke in her mother. ROS:   Please see the history of present illness.    All 14 point review of systems negative except as described per history of present illness.  EKGs/Labs/Other Studies Reviewed:    The following studies were reviewed today:   EKG:  EKG is  ordered today.  The ekg ordered today demonstrates   Recent Labs: No results found for requested labs within last 8760 hours.  Recent Lipid Panel    Component Value Date/Time   CHOL 174 01/25/2014 1722   TRIG 252 (H) 01/25/2014 1722   HDL 40 01/25/2014 1722   CHOLHDL 4.4 01/25/2014 1722   VLDL 50 (H) 01/25/2014 1722   LDLCALC 84 01/25/2014 1722    Physical Exam:    VS:  BP 116/60   Pulse 60   Ht 5' (1.524 m)   Wt 136 lb 12.8 oz (62.1 kg)   SpO2 98%   BMI 26.72 kg/m     Wt Readings from Last 3 Encounters:  11/24/17 136 lb 12.8 oz (62.1 kg)  04/17/16 138 lb 9.6 oz (62.9 kg)  05/25/14 138 lb 4 oz (62.7  kg)     GEN:  Well nourished, well developed in no acute distress HEENT: Normal NECK: No JVD; No carotid bruits LYMPHATICS: No lymphadenopathy CARDIAC: RRR, no murmurs, no rubs, no gallops RESPIRATORY:  Clear to auscultation without rales, wheezing or rhonchi  ABDOMEN: Soft, non-tender, non-distended MUSCULOSKELETAL:  No edema; No deformity  SKIN: Warm and dry NEUROLOGIC:  Alert and oriented x 3 PSYCHIATRIC:  Normal affect   ASSESSMENT:    1. Mixed hyperlipidemia   2. Palpitations   3. Vasovagal syncope    PLAN:    In order of problems listed above:  1. Vasovagal syncope.  It is interesting because she usually started getting symptoms when she is laying down I think it is provoked by significant pain that she got because of GERD.  I still think appropriate course of action will be to put monitor on her make sure she does not have any significant arrhythmia.  She will wear where 14 days Holter monitor.  As a part of evaluation I asked her to have echocardiogram done to assess her left ventricular ejection fraction.  She knows already to have the medication for her stomach at the bedside table letter to him get up in the middle of the night with pain in epigastrium going to the restroom.  I doubt very much that her pain in the epigastrium is related to her heart it happens at night does not happen with exercise at the same time she is able to dance with no difficulties and no pain. 2. Mixed dyslipidemia we will try to get her  fasting lipid profile. 3. Palpitations we will do echocardiogram to assess left ventricular ejection fraction.   Medication Adjustments/Labs and Tests Ordered: Current medicines are reviewed at length with the patient today.  Concerns regarding medicines are outlined above.  No orders of the defined types were placed in this encounter.  No orders of the defined types were placed in this encounter.   Signed, Georgeanna [name], MD, [facility]. 11/24/2017 2:16 PM     [facility]

## 2017-11-29 DIAGNOSIS — I471 Supraventricular tachycardia, unspecified: Secondary | ICD-10-CM | POA: Insufficient documentation

## 2017-12-04 ENCOUNTER — Ambulatory Visit (HOSPITAL_BASED_OUTPATIENT_CLINIC_OR_DEPARTMENT_OTHER)
Admission: RE | Admit: 2017-12-04 | Discharge: 2017-12-04 | Disposition: A | Discharge status: Home / Self Care | Payer: 59 | Source: Ambulatory Visit | Attending: Cardiology | Admitting: Cardiology

## 2017-12-04 DIAGNOSIS — R55 Syncope and collapse: Secondary | ICD-10-CM

## 2017-12-04 DIAGNOSIS — R002 Palpitations: Secondary | ICD-10-CM | POA: Insufficient documentation

## 2017-12-04 NOTE — Progress Notes (Signed)
  Echocardiogram 2D Echocardiogram has been performed.  Jasmine Hahn Jasmine Hahn 12/04/2017, 3:54 PM

## 2017-12-08 ENCOUNTER — Ambulatory Visit: Payer: 59

## 2017-12-08 DIAGNOSIS — R002 Palpitations: Secondary | ICD-10-CM

## 2017-12-14 ENCOUNTER — Telehealth: Payer: Self-pay | Admitting: Emergency Medicine

## 2017-12-14 NOTE — Telephone Encounter (Signed)
Clarified patient's echocardiogram results per her mychart message with her, she still has small details she plans to discuss with Dr. Agustin Cree at her next office visit.

## 2017-12-28 DIAGNOSIS — Z09 Encounter for follow-up examination after completed treatment for conditions other than malignant neoplasm: Secondary | ICD-10-CM

## 2017-12-28 HISTORY — DX: Encounter for follow-up examination after completed treatment for conditions other than malignant neoplasm: Z09

## 2018-01-05 ENCOUNTER — Telehealth: Payer: Self-pay | Admitting: Cardiology

## 2018-01-05 NOTE — Telephone Encounter (Signed)
Echo looks good,  Monitor - short SVT  Will talk about this during visit

## 2018-01-05 NOTE — Telephone Encounter (Signed)
Patient has a funeral and can't make appt. Can she be called with results?

## 2018-01-06 NOTE — Telephone Encounter (Signed)
Patient informed of results and rescheduled her follow up appointment.

## 2018-01-07 ENCOUNTER — Ambulatory Visit: Payer: 59 | Admitting: Cardiology

## 2018-01-18 ENCOUNTER — Ambulatory Visit (INDEPENDENT_AMBULATORY_CARE_PROVIDER_SITE_OTHER): Payer: 59 | Admitting: Cardiology

## 2018-01-18 ENCOUNTER — Encounter: Payer: Self-pay | Admitting: Cardiology

## 2018-01-18 VITALS — BP 110/70 | HR 95 | Ht 60.0 in | Wt 133.4 lb

## 2018-01-18 DIAGNOSIS — E782 Mixed hyperlipidemia: Secondary | ICD-10-CM | POA: Diagnosis not present

## 2018-01-18 DIAGNOSIS — Z96651 Presence of right artificial knee joint: Secondary | ICD-10-CM

## 2018-01-18 DIAGNOSIS — R002 Palpitations: Secondary | ICD-10-CM

## 2018-01-18 MED ORDER — METOPROLOL TARTRATE 25 MG PO TABS: 12.5000 mg | ORAL_TABLET | Freq: Two times a day (BID) | ORAL | 1 refills | Status: DC

## 2018-01-18 NOTE — Patient Instructions (Signed)
Medication Instructions:  Your physician has recommended you make the following change in your medication:   Start: metoprolol tartrate 12.5 mg twice daily  If you need a refill on your cardiac medications before your next appointment, please call your pharmacy.   Lab work: None.  If you have labs (blood work) drawn today and your tests are completely normal, you will receive your results only by: Marland Kitchen MyChart Message (if you have MyChart) OR . A paper copy in the mail If you have any lab test that is abnormal or we need to change your treatment, we will call you to review the results.  Testing/Procedures: None.   Follow-Up: At [facility], you and your health needs are our priority.  As part of our continuing mission to provide you with exceptional heart care, we have created designated Provider Care Teams.  These Care Teams include your primary Cardiologist (physician) and Advanced Practice Providers (APPs -  Physician Assistants and Nurse Practitioners) who all work together to provide you with the care you need, when you need it. You will need a follow up appointment in 3 months.  Please call our office 2 months in advance to schedule this appointment.  You may see No primary care provider on file. or another member of our [employer] Provider Team in [place]: Shirlee More, MD . Jyl Heinz, MD  Any Other Special Instructions Will Be Listed Below (If Applicable).   Metoprolol tablets What is this medicine? METOPROLOL (me TOE proe lole) is a beta-blocker. Beta-blockers reduce the workload on the heart and help it to beat more regularly. This medicine is used to treat high blood pressure and to prevent chest pain. It is also used to after a heart attack and to prevent an additional heart attack from occurring. This medicine may be used for other purposes; ask your health care provider or pharmacist if you have questions. COMMON BRAND NAME(S): Lopressor What should I tell my  health care provider before I take this medicine? They need to know if you have any of these conditions: -diabetes -heart or vessel disease like slow heart rate, worsening heart failure, heart block, sick sinus syndrome or Raynaud's disease -kidney disease -liver disease -lung or breathing disease, like asthma or emphysema -pheochromocytoma -thyroid disease -an unusual or allergic reaction to metoprolol, other beta-blockers, medicines, foods, dyes, or preservatives -pregnant or trying to get pregnant -breast-feeding How should I use this medicine? Take this medicine by mouth with a drink of water. Follow the directions on the prescription label. Take this medicine immediately after meals. Take your doses at regular intervals. Do not take more medicine than directed. Do not stop taking this medicine suddenly. This could lead to serious heart-related effects. Talk to your pediatrician regarding the use of this medicine in children. Special care may be needed. Overdosage: If you think you have taken too much of this medicine contact a poison control center or emergency room at once. NOTE: This medicine is only for you. Do not share this medicine with others. What if I miss a dose? If you miss a dose, take it as soon as you can. If it is almost time for your next dose, take only that dose. Do not take double or extra doses. What may interact with this medicine? This medicine may interact with the following medications: -certain medicines for blood pressure, heart disease, irregular heart beat -certain medicines for depression like monoamine oxidase (MAO) inhibitors, fluoxetine, or paroxetine -clonidine -dobutamine -epinephrine -isoproterenol -reserpine This  list may not describe all possible interactions. Give your health care provider a list of all the medicines, herbs, non-prescription drugs, or dietary supplements you use. Also tell them if you smoke, drink alcohol, or use illegal drugs.  Some items may interact with your medicine. What should I watch for while using this medicine? Visit your doctor or health care professional for regular check ups. Contact your doctor right away if your symptoms worsen. Check your blood pressure and pulse rate regularly. Ask your health care professional what your blood pressure and pulse rate should be, and when you should contact them. You may get drowsy or dizzy. Do not drive, use machinery, or do anything that needs mental alertness until you know how this medicine affects you. Do not sit or stand up quickly, especially if you are an older patient. This reduces the risk of dizzy or fainting spells. Contact your doctor if these symptoms continue. Alcohol may interfere with the effect of this medicine. Avoid alcoholic drinks. What side effects may I notice from receiving this medicine? Side effects that you should report to your doctor or health care professional as soon as possible: -allergic reactions like skin rash, itching or hives -cold or numb hands or feet -depression -difficulty breathing -faint -fever with sore throat -irregular heartbeat, chest pain -rapid weight gain -swollen legs or ankles Side effects that usually do not require medical attention (report to your doctor or health care professional if they continue or are bothersome): -anxiety or nervousness -change in sex drive or performance -dry skin -headache -nightmares or trouble sleeping -short term memory loss -stomach upset or diarrhea -unusually tired This list may not describe all possible side effects. Call your doctor for medical advice about side effects. You may report side effects to FDA at 1-800-FDA-1088. Where should I keep my medicine? Keep out of the reach of children. Store at room temperature between 15 and 30 degrees C (59 and 86 degrees F). Throw away any unused medicine after the expiration date. NOTE: This sheet is a summary. It may not cover all  possible information. If you have questions about this medicine, talk to your doctor, pharmacist, or health care provider.  2019 Elsevier/Gold Standard (2012-09-10 14:40:36)

## 2018-01-18 NOTE — Progress Notes (Signed)
Cardiology Office Note:    Date:  01/18/2018   ID:  Jasmine Hahn, DOB 1960-09-21, MRN [id number]  PCP:  Nonnie Done., MD  Cardiologist:  Gypsy Balsam, MD    Referring MD: Nonnie Done., MD   Chief Complaint  Patient presents with  . Follow-up  Doing well cardiac wise but complained of having palpitations  History of Present Illness:    Jasmine Hahn is a 57 y.o. female with palpitations and episode of syncope denies having any recent syncope lately.  She did have a right total knee replacement done in the summertime doing well after that.  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  Described to have palpitations also noted sometimes her heart rate speeding up with no particular reason.  She did wear a event recorder which showed evidence of short lasting and supraventricular tachycardia.  I think she can benefit from small dose of beta-blocker which I will initiate we will give her 12.5 mill Toprol tartrate twice daily  Past Medical History:  Diagnosis Date  . Aftercare following right knee joint replacement surgery 09/01/2017  . ALLERGIC RHINITIS, SEASONAL 06/26/2006   Qualifier: Diagnosis of  By: Drue Novel MD, Nolon Rod.   . Allergy   . Arthritis   . Discoid lupus   . FATIGUE 08/23/2007   Qualifier: Diagnosis of  By: Janit Bern    . Former tobacco use 07/29/2017  . GERD 06/26/2006   Qualifier: Diagnosis of  By: Drue Novel MD, Nolon Rod.   . GERD (gastroesophageal reflux disease)   . History of hiatal hernia   . 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 Hormone replacement therapy (HRT) 07/29/2017  . Hyperlipidemia   . Hypothyroidism   . HYPOTHYROIDISM 06/26/2006   Qualifier: Diagnosis of  By: Drue Novel MD, Nolon Rod.   . Hypothyroidism, postsurgical 07/29/2017  . INSOMNIA 06/26/2006   Qualifier: Diagnosis of  By: Drue Novel MD, Nolon Rod.   . MAXILLARY SINUSITIS 06/26/2006   Qualifier: Diagnosis of  By: Drue Novel MD, Nolon Rod.     . Primary localized osteoarthritis of right knee 04/17/2016  . SINUSITIS- ACUTE-NOS 12/29/2006   Qualifier: Diagnosis of  By: Drue Novel MD, Jose E.   . Syncope    not sure why  . Thyroid disease    hypo  . URI 08/23/2007   Qualifier: Diagnosis of  By: Janit Bern    . Wears partial dentures     Past Surgical History:  Procedure Laterality Date  . ABDOMINAL HYSTERECTOMY    . bone spurs     feet-both feet  . CHOLECYSTECTOMY    . COLONOSCOPY    . ELBOW SURGERY     Dr Zachary George  . ESOPHAGOGASTRODUODENOSCOPY  02/2011   Medoff, NEG  . KNEE ARTHROSCOPY WITH DRILLING/MICROFRACTURE Right 05/25/2014   Procedure: KNEE ARTHROSCOPY WITH MICROFRACTURE PATELLA FEMORAL CONDYLE;  Surgeon: Mckinley Jewel, MD;  Location: [facility];  Service: Orthopedics;  Laterality: Right;  . KNEE ARTHROSCOPY WITH MEDIAL MENISECTOMY Right 05/25/2014   Procedure: RIGHT KNEE ARTHROSCOPY WITH PARTIAL MEDIAL;  Surgeon: Mckinley Jewel, MD;  Location: [facility];  Service: Orthopedics;  Laterality: Right;  . LYSIS OF ADHESION Right 05/25/2014   Procedure: LYSIS OF ADHESION;  Surgeon: Mckinley Jewel, MD;  Location: [facility];  Service: Orthopedics;  Laterality: Right;  . OOPHORECTOMY    . PARTIAL KNEE ARTHROPLASTY Right 04/17/2016   Procedure:  RIGHT KNEE UNICOMPARTMENTAL ARTHROPLASTY;  Surgeon: Loreta Ave, MD;  Location: [facility];  Service: Orthopedics;  Laterality: Right;  . REPLACEMENT TOTAL KNEE Right   . THYROIDECTOMY     x 2 surgeries - partial first time  . TUBAL LIGATION      Current Medications: Current Meds  Medication Sig  . Ascorbic Acid (VITAMIN C) 1000 MG tablet Take 1,000 mg by mouth daily.  Marland Kitchen BIOTIN PO Take 1 tablet by mouth daily.  . cetirizine (ZYRTEC) 10 MG tablet Take 10 mg by mouth daily.  . Cyanocobalamin (VITAMIN B-12 PO) Take 1 tablet by mouth daily.  . fluticasone (CUTIVATE) 0.05 % cream Apply 1 application topically as needed. Spot on  face  . levothyroxine (SYNTHROID, LEVOTHROID) 150 MCG tablet Take by mouth.  . Multiple Vitamins-Minerals (CENTRUM SILVER ADULT 50+ PO) Take 1 tablet by mouth daily.   . Omega-3 Fatty Acids (RA FISH OIL) 1000 MG CAPS Take by mouth.  . ranitidine (ZANTAC) 300 MG tablet TAKE TWICE A DAY WHILE TRYING TO GET OFF PPI, THEN CAN GO TO ONCE AT NIGHT (Patient taking differently: Take 300 mg by mouth at bedtime. )  . valACYclovir (VALTREX) 1000 MG tablet Take 1,000 mg by mouth daily as needed (fever blisters).   . VIVELLE-DOT 0.1 MG/24HR patch Place 1 patch onto the skin 2 (two) times a week.   . zolpidem (AMBIEN) 10 MG tablet Take 10 mg by mouth at bedtime.      Allergies:   Dog epithelium allergy skin test; Other; Pollen extract; and Prednisone   Social History   Socioeconomic History  . Marital status: Single    Spouse name: Not on file  . Number of children: Not on file  . Years of education: Not on file  . Highest education level: Not on file  Occupational History  . Not on file  Social Needs  . Financial resource strain: Not on file  . Food insecurity:    Worry: Not on file    Inability: Not on file  . Transportation needs:    Medical: Not on file    Non-medical: Not on file  Tobacco Use  . Smoking status: Former Smoker    Packs/day: 0.50    Years: 30.00    Pack years: 15.00    Last attempt to quit: 05/21/2011    Years since quitting: 6.6  . Smokeless tobacco: Never Used  Substance and Sexual Activity  . Alcohol use: Never    Frequency: Never  . Drug use: Yes    Types: Marijuana  . Sexual activity: Not on file  Lifestyle  . Physical activity:    Days per week: Not on file    Minutes per session: Not on file  . Stress: Not on file  Relationships  . Social connections:    Talks on phone: Not on file    Gets together: Not on file    Attends religious service: Not on file    Active member of club or organization: Not on file    Attends meetings of clubs or organizations:  Not on file    Relationship status: Not on file  Other Topics Concern  . Not on file  Social History Narrative  . Not on file     Family History: The patient's family history includes COPD in her maternal grandfather; Cancer in her father; Cancer (age of onset: 68) in her brother; Diabetes in her mother; Heart disease in her maternal grandmother and  mother; Hyperlipidemia in her mother; Hypertension in her mother; Hypothyroidism in her mother; Stroke in her mother. ROS:   Please see the history of present illness.    All 14 point review of systems negative except as described per history of present illness  EKGs/Labs/Other Studies Reviewed:      Recent Labs: No results found for requested labs within last 8760 hours.  Recent Lipid Panel    Component Value Date/Time   CHOL 174 01/25/2014 1722   TRIG 252 (H) 01/25/2014 1722   HDL 40 01/25/2014 1722   CHOLHDL 4.4 01/25/2014 1722   VLDL 50 (H) 01/25/2014 1722   LDLCALC 84 01/25/2014 1722    Physical Exam:    VS:  BP 110/70   Pulse 95   Ht 5' (1.524 m)   Wt 133 lb 6.4 oz (60.5 kg)   SpO2 98%   BMI 26.05 kg/m     Wt Readings from Last 3 Encounters:  01/18/18 133 lb 6.4 oz (60.5 kg)  11/24/17 136 lb 12.8 oz (62.1 kg)  04/17/16 138 lb 9.6 oz (62.9 kg)     GEN:  Well nourished, well developed in no acute distress HEENT: Normal NECK: No JVD; No carotid bruits LYMPHATICS: No lymphadenopathy CARDIAC: RRR, no murmurs, no rubs, no gallops RESPIRATORY:  Clear to auscultation without rales, wheezing or rhonchi  ABDOMEN: Soft, non-tender, non-distended MUSCULOSKELETAL:  No edema; No deformity  SKIN: Warm and dry LOWER EXTREMITIES: no swelling NEUROLOGIC:  Alert and oriented x 3 PSYCHIATRIC:  Normal affect   ASSESSMENT:    1. Palpitations   2. Mixed hyperlipidemia   3. 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 Palpitations we will initiate the  Toprol tartrate 12.5 twice daily 2. Mixed dyslipidemia diet exercises and medications she continues 3. History of revision of the total replacement of right knee doing well from that point review   Medication Adjustments/Labs and Tests Ordered: Current medicines are reviewed at length with the patient today.  Concerns regarding medicines are outlined above.  No orders of the defined types were placed in this encounter.  Medication changes: No orders of the defined types were placed in this encounter.   Signed, Georgeanna [name], MD, [facility] 01/18/2018 11:33 AM    [facility]

## 2018-04-27 ENCOUNTER — Telehealth (INDEPENDENT_AMBULATORY_CARE_PROVIDER_SITE_OTHER): Payer: 59 | Admitting: Cardiology

## 2018-04-27 ENCOUNTER — Other Ambulatory Visit: Payer: Self-pay

## 2018-04-27 ENCOUNTER — Encounter: Payer: Self-pay | Admitting: Cardiology

## 2018-04-27 VITALS — Wt 140.0 lb

## 2018-04-27 DIAGNOSIS — R55 Syncope and collapse: Secondary | ICD-10-CM

## 2018-04-27 DIAGNOSIS — R002 Palpitations: Secondary | ICD-10-CM | POA: Diagnosis not present

## 2018-04-27 DIAGNOSIS — R0789 Other chest pain: Secondary | ICD-10-CM | POA: Insufficient documentation

## 2018-04-27 DIAGNOSIS — Z87891 Personal history of nicotine dependence: Secondary | ICD-10-CM

## 2018-04-27 HISTORY — DX: Other chest pain: R07.89

## 2018-04-27 MED ORDER — METOPROLOL TARTRATE 25 MG PO TABS: 12.5000 mg | ORAL_TABLET | Freq: Two times a day (BID) | ORAL | 1 refills | Status: DC

## 2018-04-27 NOTE — Patient Instructions (Signed)
Medication Instructions:  Your physician recommends that you continue on your current medications as directed. Please refer to the Current Medication list given to you today.  If you need a refill on your cardiac medications before your next appointment, please call your pharmacy.   Lab work: None.  If you have labs (blood work) drawn today and your tests are completely normal, you will receive your results only by: . MyChart Message (if you have MyChart) OR . A paper copy in the mail If you have any lab test that is abnormal or we need to change your treatment, we will call you to review the results.  Testing/Procedures: None.   Follow-Up: At [facility], you and your health needs are our priority.  As part of our continuing mission to provide you with exceptional heart care, we have created designated Provider Care Teams.  These Care Teams include your primary Cardiologist (physician) and Advanced Practice Providers (APPs -  Physician Assistants and Nurse Practitioners) who all work together to provide you with the care you need, when you need it. You will need a follow up appointment in 3 months.  Please call our office 2 months in advance to schedule this appointment.  You may see No primary care provider on file. or another member of our [facility] Provider Team in [place]: Brian Munley, MD . Rajan Revankar, MD  Any Other Special Instructions Will Be Listed Below (If Applicable).     

## 2018-04-27 NOTE — Addendum Note (Signed)
Addended by: Ashok Norris on: 04/27/2018 10:17 AM   Modules accepted: Orders

## 2018-04-27 NOTE — Progress Notes (Signed)
Virtual Visit via Video Note   This visit type was conducted due to national recommendations for restrictions regarding the COVID-19 Pandemic (e.g. social distancing) in an effort to limit this patient's exposure and mitigate transmission in our community.  Due to her co-morbid illnesses, this patient is at least at moderate risk for complications without adequate follow up.  This format is felt to be most appropriate for this patient at this time.  All issues noted in this document were discussed and addressed.  A limited physical exam was performed with this format.  Please refer to the patient's chart for her consent to telehealth for [facility].  Evaluation Performed:  Follow-up visit  This visit type was conducted due to national recommendations for restrictions regarding the COVID-19 Pandemic (e.g. social distancing).  This format is felt to be most appropriate for this patient at this time.  All issues noted in this document were discussed and addressed.  No physical exam was performed (except for noted visual exam findings with Video Visits).  Please refer to the patient's chart (MyChart message for video visits and phone note for telephone visits) for the patient's consent to telehealth for [facility].  Date:  04/27/2018  ID: Jasmine Hahn, DOB 04/20/1960, MRN [id number]   Patient Location:  [address] [place] [address]   Provider location:   [facility] [place] [facility]  PCP:  Nonnie Done., MD  Cardiologist:  Gypsy Balsam, MD     Chief Complaint: I had some chest pain  History of Present Illness:    Jasmine Hahn is a 58 y.o. female  who presents via audio/video conferencing for a telehealth visit today.  With palpitations.  Has been excited about her purpose of today's visit was talk about those issues she is doing well denies having any frequent palpitations still feels some skipped beats but those are rare and far in between she is happy the way she  feels.  She described episodes of chest pain that happened about 2 weeks ago she was working outside in the garden was doing well then she came home and actually her friend called her she stopped talking on the phone and started experiencing some chest sensation 5 in scale up to 10 without radiation no sweating she eventually end up taking aspirin taken 25 mg and her chest sensation went away that was the only sensation that she had.  Since that time she is able to walk climb stairs and have no difficulty doing it. She did not passed out Palpitations are improving   The patient does not have symptoms concerning for COVID-19 infection (fever, chills, cough, or new SHORTNESS OF BREATH).    Prior CV studies:   The following studies were reviewed today:  Echocardiogram which showed preserved left ventricular ejection fraction. Event recorder which showed some short lasting SVTs.     Past Medical History:  Diagnosis Date  . Aftercare following right knee joint replacement surgery 09/01/2017  . ALLERGIC RHINITIS, SEASONAL 06/26/2006   Qualifier: Diagnosis of  By: Drue Novel MD, Nolon Rod.   . Allergy   . Arthritis   . Discoid lupus   . FATIGUE 08/23/2007   Qualifier: Diagnosis of  By: Janit Bern    . Former tobacco use 07/29/2017  . GERD 06/26/2006   Qualifier: Diagnosis of  By: Drue Novel MD, Nolon Rod.   . GERD (gastroesophageal reflux disease)   . History of hiatal hernia   . History of revision of  total replacement of right knee joint 07/17/2016  . Hormone replacement therapy (HRT) 07/29/2017  . Hyperlipidemia   . Hypothyroidism   . HYPOTHYROIDISM 06/26/2006   Qualifier: Diagnosis of  By: Drue Novel MD, Nolon Rod.   . Hypothyroidism, postsurgical 07/29/2017  . INSOMNIA 06/26/2006   Qualifier: Diagnosis of  By: Drue Novel MD, Nolon Rod.   . MAXILLARY SINUSITIS 06/26/2006   Qualifier: Diagnosis of  By: Drue Novel MD, Nolon Rod.   . Primary localized osteoarthritis of right knee 04/17/2016  . SINUSITIS- ACUTE-NOS 12/29/2006   Qualifier:  Diagnosis of  By: Drue Novel MD, Jose E.   . Syncope    not sure why  . Thyroid disease    hypo  . URI 08/23/2007   Qualifier: Diagnosis of  By: Janit Bern    . Wears partial dentures     Past Surgical History:  Procedure Laterality Date  . ABDOMINAL HYSTERECTOMY    . bone spurs     feet-both feet  . CHOLECYSTECTOMY    . COLONOSCOPY    . ELBOW SURGERY     Dr Zachary George  . ESOPHAGOGASTRODUODENOSCOPY  02/2011   Medoff, NEG  . KNEE ARTHROSCOPY WITH DRILLING/MICROFRACTURE Right 05/25/2014   Procedure: KNEE ARTHROSCOPY WITH MICROFRACTURE PATELLA FEMORAL CONDYLE;  Surgeon: Mckinley Jewel, MD;  Location: [facility];  Service: Orthopedics;  Laterality: Right;  . KNEE ARTHROSCOPY WITH MEDIAL MENISECTOMY Right 05/25/2014   Procedure: RIGHT KNEE ARTHROSCOPY WITH PARTIAL MEDIAL;  Surgeon: Mckinley Jewel, MD;  Location: [facility];  Service: Orthopedics;  Laterality: Right;  . LYSIS OF ADHESION Right 05/25/2014   Procedure: LYSIS OF ADHESION;  Surgeon: Mckinley Jewel, MD;  Location: [facility];  Service: Orthopedics;  Laterality: Right;  . OOPHORECTOMY    . PARTIAL KNEE ARTHROPLASTY Right 04/17/2016   Procedure: RIGHT KNEE UNICOMPARTMENTAL ARTHROPLASTY;  Surgeon: Loreta Ave, MD;  Location: [facility];  Service: Orthopedics;  Laterality: Right;  . REPLACEMENT TOTAL KNEE Right   . THYROIDECTOMY     x 2 surgeries - partial first time  . TUBAL LIGATION       No outpatient medications have been marked as taking for the 04/27/18 encounter (Telemedicine) with Georgeanna [name], MD.      Family History: The patient's family history includes COPD in her maternal grandfather; Cancer in her father; Cancer (age of onset: 55) in her brother; Diabetes in her mother; Heart disease in her maternal grandmother and mother; Hyperlipidemia in her mother; Hypertension in her mother; Hypothyroidism in her mother; Stroke in her mother.   ROS:   Please see the  history of present illness.     All other systems reviewed and are negative.   Labs/Other Tests and Data Reviewed:     Recent Labs: No results found for requested labs within last 8760 hours.  Recent Lipid Panel    Component Value Date/Time   CHOL 174 01/25/2014 1722   TRIG 252 (H) 01/25/2014 1722   HDL 40 01/25/2014 1722   CHOLHDL 4.4 01/25/2014 1722   VLDL 50 (H) 01/25/2014 1722   LDLCALC 84 01/25/2014 1722      Exam:    Vital Signs:  Wt 140 lb (63.5 kg)   BMI 27.34 kg/m     Wt Readings from Last 3 Encounters:  04/27/18 140 lb (63.5 kg)  01/18/18 133 lb 6.4 oz (60.5 kg)  11/24/17 136 lb 12.8 oz (62.1 kg)     Well nourished, well developed female in no  acute distress. She is alert awake oriented x3 quite cheerful there is no JVD no swelling of lower extremities she sitting in the living room while talking to me and walks around  Diagnosis for this visit:   1. Palpitations   2. Former tobacco use   3. Vasovagal syncope   4. Atypical chest pain      ASSESSMENT & PLAN:    1.  Palpitations much improved with beta-blocker which I will continue. 2.  Former tobacco abuse does not smoke anymore he was stressed importance of avoiding it. 3.  History of vasovagal syncope no more syncope.  She stay well-hydrated. 4.  Atypical chest pain.  I will monitor the situation.  I told her to let me know if she have another episode of chest pain.  Also told her chest and will not be relieved after a few minutes she is to call 911. 5.  Status post knee surgery doing well and very happy with the way her knee behest now.  COVID-19 Education: The signs and symptoms of COVID-19 were discussed with the patient and how to seek care for testing (follow up with PCP or arrange E-visit).  The importance of social distancing was discussed today.  Patient Risk:   After full review of this patients clinical status, I feel that they are at least moderate risk at this time.  Time:   Today,  I have spent 17 minutes with the patient with telehealth technology discussing pt health issues. Visit was finished at 952am.  I spent 5 minutes reviewing chart before the visit.    Medication Adjustments/Labs and Tests Ordered: Current medicines are reviewed at length with the patient today.  Concerns regarding medicines are outlined above.  No orders of the defined types were placed in this encounter.  Medication changes: No orders of the defined types were placed in this encounter.    Disposition:  3 month f/up    Signed, Georgeanna [name], MD, [facility] 04/27/2018 9:50 AM    [facility]

## 2018-06-16 ENCOUNTER — Other Ambulatory Visit: Payer: Self-pay | Admitting: Obstetrics and Gynecology

## 2018-06-16 ENCOUNTER — Other Ambulatory Visit: Payer: Self-pay

## 2018-06-16 ENCOUNTER — Ambulatory Visit
Admission: RE | Admit: 2018-06-16 | Discharge: 2018-06-16 | Disposition: A | Discharge status: Home / Self Care | Payer: 59 | Source: Ambulatory Visit | Attending: Obstetrics and Gynecology | Admitting: Obstetrics and Gynecology

## 2018-06-16 DIAGNOSIS — Z1231 Encounter for screening mammogram for malignant neoplasm of breast: Secondary | ICD-10-CM

## 2018-07-09 ENCOUNTER — Other Ambulatory Visit: Payer: Self-pay | Admitting: Cardiology

## 2018-07-28 ENCOUNTER — Telehealth: Payer: Self-pay | Admitting: Cardiology

## 2018-07-28 ENCOUNTER — Other Ambulatory Visit: Payer: Self-pay

## 2019-02-25 DIAGNOSIS — U071 COVID-19: Secondary | ICD-10-CM

## 2019-02-25 HISTORY — DX: COVID-19: U07.1

## 2019-03-01 ENCOUNTER — Ambulatory Visit (INDEPENDENT_AMBULATORY_CARE_PROVIDER_SITE_OTHER): Payer: BC Managed Care – PPO | Admitting: Cardiology

## 2019-03-01 ENCOUNTER — Encounter: Payer: Self-pay | Admitting: Cardiology

## 2019-03-01 ENCOUNTER — Other Ambulatory Visit: Payer: Self-pay

## 2019-03-01 VITALS — BP 112/80 | HR 97 | Ht 60.0 in | Wt 149.0 lb

## 2019-03-01 DIAGNOSIS — Z87891 Personal history of nicotine dependence: Secondary | ICD-10-CM

## 2019-03-01 DIAGNOSIS — R002 Palpitations: Secondary | ICD-10-CM | POA: Diagnosis not present

## 2019-03-01 DIAGNOSIS — R0789 Other chest pain: Secondary | ICD-10-CM

## 2019-03-01 NOTE — Patient Instructions (Signed)
Medication Instructions:  No medication changes *If you need a refill on your cardiac medications before your next appointment, please call your pharmacy*  Lab Work: None ordered If you have labs (blood work) drawn today and your tests are completely normal, you will receive your results only by: Marland Kitchen MyChart Message (if you have MyChart) OR . A paper copy in the mail If you have any lab test that is abnormal or we need to change your treatment, we will call you to review the results.  Testing/Procedures: None ordered  Follow-Up: At [facility], you and your health needs are our priority.  As part of our continuing mission to provide you with exceptional heart care, we have created designated Provider Care Teams.  These Care Teams include your primary Cardiologist (physician) and Advanced Practice Providers (APPs -  Physician Assistants and Nurse Practitioners) who all work together to provide you with the care you need, when you need it.  Your next appointment:   2 month(s)  The format for your next appointment:   In Person  Provider:   Jenne Campus, MD  Other Instructions NA

## 2019-03-01 NOTE — Progress Notes (Signed)
Cardiology Office Note:    Date:  03/01/2019   ID:  Jasmine Hahn, DOB 1960-09-25, MRN [id number]  PCP:  Dema Severin, NP  Cardiologist:  Gypsy Balsam, MD    Referring MD: Nonnie Done., MD   Chief Complaint  Patient presents with  . Follow-up    10 Months    History of Present Illness:    Jasmine Hahn is a 59 y.o. female history significant for palpitations, atypical chest pain, history of tobacco abuse, dyslipidemia.  Comes today to my office for follow-up.  In December she got COVID-19 infection and she got pretty rough course still recovering.  Still having a lot of shortness of breath with exertion.  She is being followed by pulmonary.  She did have recent CT of her chest done.  Does have slightly higher number of extra beats but overall seems to be doing well from that point review.  She also described to have some chest pain on the right side worse with taking deep breath or coughing.  This is the reason for her CAT scan that being already done.  She is seeing pulmonologist tomorrow.  Past Medical History:  Diagnosis Date  . Aftercare following right knee joint replacement surgery 09/01/2017  . ALLERGIC RHINITIS, SEASONAL 06/26/2006   Qualifier: Diagnosis of  By: Drue Novel MD, Nolon Rod.   . Allergy   . Arthritis   . Discoid lupus   . FATIGUE 08/23/2007   Qualifier: Diagnosis of  By: Janit Bern    . Former tobacco use 07/29/2017  . GERD 06/26/2006   Qualifier: Diagnosis of  By: Drue Novel MD, Nolon Rod.   . GERD (gastroesophageal reflux disease)   . History of hiatal hernia   . 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 Hormone replacement therapy (HRT) 07/29/2017  . Hyperlipidemia   . Hypothyroidism   . HYPOTHYROIDISM 06/26/2006   Qualifier: Diagnosis of  By: Drue Novel MD, Nolon Rod.   . Hypothyroidism, postsurgical 07/29/2017  . INSOMNIA 06/26/2006   Qualifier: Diagnosis of  By: Drue Novel MD, Nolon Rod.   . MAXILLARY SINUSITIS 06/26/2006   Qualifier:  Diagnosis of  By: Drue Novel MD, Nolon Rod.   . Primary localized osteoarthritis of right knee 04/17/2016  . SINUSITIS- ACUTE-NOS 12/29/2006   Qualifier: Diagnosis of  By: Drue Novel MD, Jose E.   . Syncope    not sure why  . Thyroid disease    hypo  . URI 08/23/2007   Qualifier: Diagnosis of  By: Janit Bern    . Wears partial dentures     Past Surgical History:  Procedure Laterality Date  . ABDOMINAL HYSTERECTOMY    . bone spurs     feet-both feet  . CHOLECYSTECTOMY    . COLONOSCOPY    . ELBOW SURGERY     Dr Zachary George  . ESOPHAGOGASTRODUODENOSCOPY  02/2011   Medoff, NEG  . KNEE ARTHROSCOPY WITH DRILLING/MICROFRACTURE Right 05/25/2014   Procedure: KNEE ARTHROSCOPY WITH MICROFRACTURE PATELLA FEMORAL CONDYLE;  Surgeon: Mckinley Jewel, MD;  Location: [facility];  Service: Orthopedics;  Laterality: Right;  . KNEE ARTHROSCOPY WITH MEDIAL MENISECTOMY Right 05/25/2014   Procedure: RIGHT KNEE ARTHROSCOPY WITH PARTIAL MEDIAL;  Surgeon: Mckinley Jewel, MD;  Location: [facility];  Service: Orthopedics;  Laterality: Right;  . LYSIS OF ADHESION Right 05/25/2014   Procedure: LYSIS OF ADHESION;  Surgeon: Mckinley Jewel, MD;  Location: [facility];  Service: Orthopedics;  Laterality:  Right;  Marland Kitchen OOPHORECTOMY    . PARTIAL KNEE ARTHROPLASTY Right 04/17/2016   Procedure: RIGHT KNEE UNICOMPARTMENTAL ARTHROPLASTY;  Surgeon: Loreta Ave, MD;  Location: [facility];  Service: Orthopedics;  Laterality: Right;  . REPLACEMENT TOTAL KNEE Right   . THYROIDECTOMY     x 2 surgeries - partial first time  . TUBAL LIGATION      Current Medications: Current Meds  Medication Sig  . Ascorbic Acid (VITAMIN C) 1000 MG tablet Take 1,000 mg by mouth daily.  Marland Kitchen BIOTIN PO Take 1 tablet by mouth daily.  . cetirizine (ZYRTEC) 10 MG tablet Take 10 mg by mouth daily.  . Cyanocobalamin (VITAMIN B-12 PO) Take 1 tablet by mouth daily.  . fluticasone (CUTIVATE) 0.05 % cream Apply 1  application topically as needed. Spot on face  . ipratropium-albuterol (DUONEB) 0.5-2.5 (3) MG/3ML SOLN Take 3 mLs by nebulization every 4 (four) hours as needed.  Marland Kitchen levothyroxine (SYNTHROID, LEVOTHROID) 150 MCG tablet Take by mouth.  . metoprolol tartrate (LOPRESSOR) 25 MG tablet TAKE ONE-HALF TABLET BY  MOUTH TWICE DAILY  . Multiple Vitamins-Minerals (CENTRUM SILVER ADULT 50+ PO) Take 1 tablet by mouth daily.   . Omega-3 Fatty Acids (RA FISH OIL) 1000 MG CAPS Take by mouth.  . ranitidine (ZANTAC) 300 MG tablet TAKE TWICE A DAY WHILE TRYING TO GET OFF PPI, THEN CAN GO TO ONCE AT NIGHT (Patient taking differently: Take 300 mg by mouth at bedtime. )  . valACYclovir (VALTREX) 1000 MG tablet Take 1,000 mg by mouth daily as needed (fever blisters).   . VIVELLE-DOT 0.1 MG/24HR patch Place 1 patch onto the skin 2 (two) times a week.   . zolpidem (AMBIEN) 10 MG tablet Take 10 mg by mouth at bedtime.      Allergies:   Dog epithelium allergy skin test, Other, Pollen extract, and Prednisone   Social History   Socioeconomic History  . Marital status: Single    Spouse name: Not on file  . Number of children: Not on file  . Years of education: Not on file  . Highest education level: Not on file  Occupational History  . Not on file  Tobacco Use  . Smoking status: Former Smoker    Packs/day: 0.50    Years: 30.00    Pack years: 15.00    Quit date: 05/21/2011    Years since quitting: 7.7  . Smokeless tobacco: Never Used  Substance and Sexual Activity  . Alcohol use: Never  . Drug use: Yes    Types: Marijuana  . Sexual activity: Not on file  Other Topics Concern  . Not on file  Social History Narrative  . Not on file   Social Determinants of Health   Financial Resource Strain:   . Difficulty of Paying Living Expenses: Not on file  Food Insecurity:   . Worried About Programme researcher, broadcasting/film/video in the Last Year: Not on file  . Ran Out of Food in the Last Year: Not on file  Transportation Needs:     . Lack of Transportation (Medical): Not on file  . Lack of Transportation (Non-Medical): Not on file  Physical Activity:   . Days of Exercise per Week: Not on file  . Minutes of Exercise per Session: Not on file  Stress:   . Feeling of Stress : Not on file  Social Connections:   . Frequency of Communication with Friends and Family: Not on file  . Frequency of Social Gatherings with  Friends and Family: Not on file  . Attends Religious Services: Not on file  . Active Member of Clubs or Organizations: Not on file  . Attends Banker Meetings: Not on file  . Marital Status: Not on file     Family History: The patient's family history includes COPD in her maternal grandfather; Cancer in her father; Cancer (age of onset: 22) in her brother; Diabetes in her mother; Heart disease in her maternal grandmother and mother; Hyperlipidemia in her mother; Hypertension in her mother; Hypothyroidism in her mother; Stroke in her mother. ROS:   Please see the history of present illness.    All 14 point review of systems negative except as described per history of present illness  EKGs/Labs/Other Studies Reviewed:    Normal sinus rhythm, left posterior hemiblock, nonspecific ST segment changes on the EKG  Recent Labs: No results found for requested labs within last 8760 hours.  Recent Lipid Panel    Component Value Date/Time   CHOL 174 01/25/2014 1722   TRIG 252 (H) 01/25/2014 1722   HDL 40 01/25/2014 1722   CHOLHDL 4.4 01/25/2014 1722   VLDL 50 (H) 01/25/2014 1722   LDLCALC 84 01/25/2014 1722    Physical Exam:    VS:  BP 112/80   Pulse 97   Ht 5' (1.524 m)   Wt 149 lb (67.6 kg)   SpO2 98%   BMI 29.10 kg/m     Wt Readings from Last 3 Encounters:  03/01/19 149 lb (67.6 kg)  04/27/18 140 lb (63.5 kg)  01/18/18 133 lb 6.4 oz (60.5 kg)     GEN:  Well nourished, well developed in no acute distress HEENT: Normal NECK: No JVD; No carotid bruits LYMPHATICS: No  lymphadenopathy CARDIAC: RRR, no murmurs, no rubs, no gallops RESPIRATORY:  Clear to auscultation without rales, wheezing or rhonchi  ABDOMEN: Soft, non-tender, non-distended MUSCULOSKELETAL:  No edema; No deformity  SKIN: Warm and dry LOWER EXTREMITIES: no swelling NEUROLOGIC:  Alert and oriented x 3 PSYCHIATRIC:  Normal affect   ASSESSMENT:    1. Palpitations   2. Atypical chest pain   3. Former tobacco use    PLAN:    In order of problems listed above:  1. Palpitations.  Slightly more pronounced right now.  However I will wait with reevaluation of the situation after she recovered Covid.  Therefore, I will see her back in about 2 months. 2. Atypical chest pain worse with taking deep breath.  CT of her chest has been done she is to follow with pulmonary tomorrow. 3. Former tobacco use.  She is staying away from smoking.  I will continue present management.  I encouraged her to stay away from smoking. 4. Dyslipidemia again will wait for recovery after Covid and then will recheck her fasting lipid profile.   Medication Adjustments/Labs and Tests Ordered: Current medicines are reviewed at length with the patient today.  Concerns regarding medicines are outlined above.  Orders Placed This Encounter  Procedures  . EKG 12-Lead   Medication changes: No orders of the defined types were placed in this encounter.   Signed, Georgeanna [name], MD, [facility] 03/01/2019 9:23 AM    [facility]

## 2019-05-07 ENCOUNTER — Other Ambulatory Visit: Payer: Self-pay | Admitting: Cardiology

## 2019-07-13 DIAGNOSIS — K5909 Other constipation: Secondary | ICD-10-CM

## 2019-07-13 DIAGNOSIS — Z8711 Personal history of peptic ulcer disease: Secondary | ICD-10-CM

## 2019-07-13 HISTORY — DX: Personal history of peptic ulcer disease: Z87.11

## 2019-07-13 HISTORY — DX: Other constipation: K59.09

## 2019-08-11 HISTORY — PX: COLONOSCOPY: SHX174

## 2019-08-11 HISTORY — PX: ESOPHAGOGASTRODUODENOSCOPY: SHX1529

## 2019-11-21 ENCOUNTER — Other Ambulatory Visit: Payer: Self-pay | Admitting: Cardiology

## 2019-11-21 DIAGNOSIS — M9905 Segmental and somatic dysfunction of pelvic region: Secondary | ICD-10-CM | POA: Diagnosis not present

## 2019-11-21 DIAGNOSIS — M545 Low back pain, unspecified: Secondary | ICD-10-CM | POA: Diagnosis not present

## 2019-11-21 DIAGNOSIS — M9902 Segmental and somatic dysfunction of thoracic region: Secondary | ICD-10-CM | POA: Diagnosis not present

## 2019-11-21 DIAGNOSIS — M9903 Segmental and somatic dysfunction of lumbar region: Secondary | ICD-10-CM | POA: Diagnosis not present

## 2019-11-23 DIAGNOSIS — M9903 Segmental and somatic dysfunction of lumbar region: Secondary | ICD-10-CM | POA: Diagnosis not present

## 2019-11-23 DIAGNOSIS — M9905 Segmental and somatic dysfunction of pelvic region: Secondary | ICD-10-CM | POA: Diagnosis not present

## 2019-11-23 DIAGNOSIS — M9902 Segmental and somatic dysfunction of thoracic region: Secondary | ICD-10-CM | POA: Diagnosis not present

## 2019-11-23 DIAGNOSIS — M545 Low back pain, unspecified: Secondary | ICD-10-CM | POA: Diagnosis not present

## 2019-11-30 DIAGNOSIS — M9905 Segmental and somatic dysfunction of pelvic region: Secondary | ICD-10-CM | POA: Diagnosis not present

## 2019-11-30 DIAGNOSIS — M5451 Vertebrogenic low back pain: Secondary | ICD-10-CM | POA: Diagnosis not present

## 2019-11-30 DIAGNOSIS — M9903 Segmental and somatic dysfunction of lumbar region: Secondary | ICD-10-CM | POA: Diagnosis not present

## 2019-11-30 DIAGNOSIS — M9902 Segmental and somatic dysfunction of thoracic region: Secondary | ICD-10-CM | POA: Diagnosis not present

## 2019-12-07 DIAGNOSIS — M9903 Segmental and somatic dysfunction of lumbar region: Secondary | ICD-10-CM | POA: Diagnosis not present

## 2019-12-07 DIAGNOSIS — M9902 Segmental and somatic dysfunction of thoracic region: Secondary | ICD-10-CM | POA: Diagnosis not present

## 2019-12-07 DIAGNOSIS — M9905 Segmental and somatic dysfunction of pelvic region: Secondary | ICD-10-CM | POA: Diagnosis not present

## 2019-12-07 DIAGNOSIS — M5451 Vertebrogenic low back pain: Secondary | ICD-10-CM | POA: Diagnosis not present

## 2019-12-12 DIAGNOSIS — M5451 Vertebrogenic low back pain: Secondary | ICD-10-CM | POA: Diagnosis not present

## 2019-12-12 DIAGNOSIS — M9903 Segmental and somatic dysfunction of lumbar region: Secondary | ICD-10-CM | POA: Diagnosis not present

## 2019-12-12 DIAGNOSIS — M9902 Segmental and somatic dysfunction of thoracic region: Secondary | ICD-10-CM | POA: Diagnosis not present

## 2019-12-12 DIAGNOSIS — M9905 Segmental and somatic dysfunction of pelvic region: Secondary | ICD-10-CM | POA: Diagnosis not present

## 2020-01-13 DIAGNOSIS — M9903 Segmental and somatic dysfunction of lumbar region: Secondary | ICD-10-CM | POA: Diagnosis not present

## 2020-01-13 DIAGNOSIS — M9905 Segmental and somatic dysfunction of pelvic region: Secondary | ICD-10-CM | POA: Diagnosis not present

## 2020-01-13 DIAGNOSIS — M5451 Vertebrogenic low back pain: Secondary | ICD-10-CM | POA: Diagnosis not present

## 2020-01-13 DIAGNOSIS — M9902 Segmental and somatic dysfunction of thoracic region: Secondary | ICD-10-CM | POA: Diagnosis not present

## 2020-01-18 DIAGNOSIS — M5451 Vertebrogenic low back pain: Secondary | ICD-10-CM | POA: Diagnosis not present

## 2020-01-18 DIAGNOSIS — M9905 Segmental and somatic dysfunction of pelvic region: Secondary | ICD-10-CM | POA: Diagnosis not present

## 2020-01-18 DIAGNOSIS — M9902 Segmental and somatic dysfunction of thoracic region: Secondary | ICD-10-CM | POA: Diagnosis not present

## 2020-01-18 DIAGNOSIS — M9903 Segmental and somatic dysfunction of lumbar region: Secondary | ICD-10-CM | POA: Diagnosis not present

## 2020-02-20 ENCOUNTER — Telehealth: Payer: Self-pay | Admitting: Cardiology

## 2020-02-20 MED ORDER — METOPROLOL TARTRATE 25 MG PO TABS
25.0000 mg | ORAL_TABLET | Freq: Two times a day (BID) | ORAL | 0 refills | Status: DC
Start: 2020-02-20 — End: 2020-04-27

## 2020-02-20 NOTE — Telephone Encounter (Signed)
*  STAT* If patient is at the pharmacy, call can be transferred to refill team.   1. Which medications need to be refilled? (please list name of each medication and dose if known)  metoprolol tartrate (LOPRESSOR) 25 MG tablet  2. Which pharmacy/location (including street and city if local pharmacy) is medication to be sent to? [place], [place] [address]  3. Do they need a 30 day or 90 day supply? 90 with refills   Pt is out of medication   Pt was not able to get an appointment to see Dr. Agustin Cree until 04/19/20

## 2020-02-20 NOTE — Telephone Encounter (Signed)
Medication filled.  

## 2020-02-21 DIAGNOSIS — J0110 Acute frontal sinusitis, unspecified: Secondary | ICD-10-CM | POA: Diagnosis not present

## 2020-02-28 DIAGNOSIS — Z6825 Body mass index (BMI) 25.0-25.9, adult: Secondary | ICD-10-CM | POA: Diagnosis not present

## 2020-02-28 DIAGNOSIS — E039 Hypothyroidism, unspecified: Secondary | ICD-10-CM | POA: Diagnosis not present

## 2020-02-28 DIAGNOSIS — Z01419 Encounter for gynecological examination (general) (routine) without abnormal findings: Secondary | ICD-10-CM | POA: Diagnosis not present

## 2020-03-05 DIAGNOSIS — N6011 Diffuse cystic mastopathy of right breast: Secondary | ICD-10-CM | POA: Diagnosis not present

## 2020-03-05 DIAGNOSIS — R922 Inconclusive mammogram: Secondary | ICD-10-CM | POA: Diagnosis not present

## 2020-03-05 DIAGNOSIS — N6001 Solitary cyst of right breast: Secondary | ICD-10-CM | POA: Diagnosis not present

## 2020-03-14 DIAGNOSIS — J018 Other acute sinusitis: Secondary | ICD-10-CM | POA: Diagnosis not present

## 2020-04-06 DIAGNOSIS — C44729 Squamous cell carcinoma of skin of left lower limb, including hip: Secondary | ICD-10-CM | POA: Diagnosis not present

## 2020-04-06 DIAGNOSIS — D229 Melanocytic nevi, unspecified: Secondary | ICD-10-CM | POA: Diagnosis not present

## 2020-04-06 DIAGNOSIS — L570 Actinic keratosis: Secondary | ICD-10-CM | POA: Diagnosis not present

## 2020-04-06 DIAGNOSIS — C44519 Basal cell carcinoma of skin of other part of trunk: Secondary | ICD-10-CM | POA: Diagnosis not present

## 2020-04-06 DIAGNOSIS — D485 Neoplasm of uncertain behavior of skin: Secondary | ICD-10-CM | POA: Diagnosis not present

## 2020-04-06 DIAGNOSIS — L905 Scar conditions and fibrosis of skin: Secondary | ICD-10-CM | POA: Diagnosis not present

## 2020-04-06 DIAGNOSIS — Z85828 Personal history of other malignant neoplasm of skin: Secondary | ICD-10-CM | POA: Diagnosis not present

## 2020-04-09 DIAGNOSIS — J0100 Acute maxillary sinusitis, unspecified: Secondary | ICD-10-CM | POA: Diagnosis not present

## 2020-04-18 DIAGNOSIS — C44519 Basal cell carcinoma of skin of other part of trunk: Secondary | ICD-10-CM | POA: Diagnosis not present

## 2020-04-18 DIAGNOSIS — D0472 Carcinoma in situ of skin of left lower limb, including hip: Secondary | ICD-10-CM | POA: Diagnosis not present

## 2020-04-19 ENCOUNTER — Ambulatory Visit: Payer: BC Managed Care – PPO | Admitting: Cardiology

## 2020-04-26 ENCOUNTER — Other Ambulatory Visit: Payer: Self-pay

## 2020-04-26 DIAGNOSIS — T7840XA Allergy, unspecified, initial encounter: Secondary | ICD-10-CM | POA: Insufficient documentation

## 2020-04-26 DIAGNOSIS — E039 Hypothyroidism, unspecified: Secondary | ICD-10-CM | POA: Insufficient documentation

## 2020-04-26 DIAGNOSIS — Z8719 Personal history of other diseases of the digestive system: Secondary | ICD-10-CM | POA: Insufficient documentation

## 2020-04-26 DIAGNOSIS — M1990 Unspecified osteoarthritis, unspecified site: Secondary | ICD-10-CM | POA: Insufficient documentation

## 2020-04-26 DIAGNOSIS — Z972 Presence of dental prosthetic device (complete) (partial): Secondary | ICD-10-CM | POA: Insufficient documentation

## 2020-04-27 ENCOUNTER — Other Ambulatory Visit: Payer: Self-pay

## 2020-04-27 ENCOUNTER — Encounter: Payer: Self-pay | Admitting: Cardiology

## 2020-04-27 ENCOUNTER — Ambulatory Visit: Payer: BC Managed Care – PPO | Admitting: Cardiology

## 2020-04-27 VITALS — BP 126/82 | HR 76 | Ht 60.0 in | Wt 146.0 lb

## 2020-04-27 DIAGNOSIS — R0789 Other chest pain: Secondary | ICD-10-CM

## 2020-04-27 DIAGNOSIS — R002 Palpitations: Secondary | ICD-10-CM | POA: Diagnosis not present

## 2020-04-27 DIAGNOSIS — E782 Mixed hyperlipidemia: Secondary | ICD-10-CM | POA: Diagnosis not present

## 2020-04-27 DIAGNOSIS — Z8711 Personal history of peptic ulcer disease: Secondary | ICD-10-CM | POA: Diagnosis not present

## 2020-04-27 MED ORDER — METOPROLOL SUCCINATE ER 25 MG PO TB24: 25.0000 mg | ORAL_TABLET | Freq: Every day | ORAL | 1 refills | Status: DC

## 2020-04-27 NOTE — Patient Instructions (Signed)
Medication Instructions:  Your physician has recommended you make the following change in your medication:   STOP: Metoprolol tartrate   START: Metoprolol succinate 25 mg daily   *If you need a refill on your cardiac medications before your next appointment, please call your pharmacy*   Lab Work: none If you have labs (blood work) drawn today and your tests are completely normal, you will receive your results only by: Marland Kitchen MyChart Message (if you have MyChart) OR . A paper copy in the mail If you have any lab test that is abnormal or we need to change your treatment, we will call you to review the results.   Testing/Procedures: none   Follow-Up: At [facility], you and your health needs are our priority.  As part of our continuing mission to provide you with exceptional heart care, we have created designated Provider Care Teams.  These Care Teams include your primary Cardiologist (physician) and Advanced Practice Providers (APPs -  Physician Assistants and Nurse Practitioners) who all work together to provide you with the care you need, when you need it.  We recommend signing up for the patient portal called "MyChart".  Sign up information is provided on this After Visit Summary.  MyChart is used to connect with patients for Virtual Visits (Telemedicine).  Patients are able to view lab/test results, encounter notes, upcoming appointments, etc.  Non-urgent messages can be sent to your provider as well.   To learn more about what you can do with MyChart, go to [email or web address].    Your next appointment:   1 year(s)  The format for your next appointment:   In Person  Provider:   Jenne Campus, MD   Other Instructions

## 2020-04-27 NOTE — Progress Notes (Signed)
Cardiology Office Note:    Date:  04/27/2020   ID:  Jasmine Hahn, DOB 11-28-1960, MRN [id number]  PCP:  Dema Severin, NP  Cardiologist:  Gypsy Balsam, MD    Referring MD: Dema Severin, NP   Chief Complaint  Patient presents with  . Follow-up    History of Present Illness:    Jasmine Hahn is a 60 y.o. female with past medical history significant for PVCs, palpitations, atypical chest pain.  She is an exchronic smoker does have chronic lung injury, also she did suffer from COVID-19 infection have difficult time to recover but feels like back to almost herself.  Comes today 2 months of follow-up overall seems to be doing well palpitations under control she is not bothered by that.  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  Past Medical History:  Diagnosis Date  . Aftercare following right knee joint replacement surgery 09/01/2017  . ALLERGIC RHINITIS, SEASONAL 06/26/2006   Qualifier: Diagnosis of  By: Drue Novel MD, Nolon Rod.   . Allergy   . Arthritis   . Atypical chest pain 04/27/2018  . Chronic constipation 07/13/2019  . COVID-19 02/25/2019  . Discoid lupus   . FATIGUE 08/23/2007   Qualifier: Diagnosis of  By: Janit Bern    . Follow-up examination after orthopedic surgery 12/28/2017  . Former tobacco use 07/29/2017  . GERD 06/26/2006   Qualifier: Diagnosis of  By: Drue Novel MD, Nolon Rod.   . GERD (gastroesophageal reflux disease)   . History of hiatal hernia   . 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 Hormone replacement therapy (HRT) 07/29/2017  . Hyperlipidemia   . Hypothyroidism   . HYPOTHYROIDISM 06/26/2006   Qualifier: Diagnosis of  By: Drue Novel MD, Nolon Rod.   . Hypothyroidism, postsurgical 07/29/2017  . INSOMNIA 06/26/2006   Qualifier: Diagnosis of  By: Drue Novel MD, Nolon Rod.   . MAXILLARY SINUSITIS 06/26/2006   Qualifier: Diagnosis of  By: Drue Novel MD, Nolon Rod.   . Primary localized osteoarthritis of right knee 04/17/2016  . SINUSITIS- ACUTE-NOS 12/29/2006   Qualifier: Diagnosis of  By: Drue Novel MD, Jose E.   . Syncope    not sure why  . Thyroid disease    hypo  . URI 08/23/2007   Qualifier: Diagnosis of  By: Janit Bern    . Wears partial dentures     Past Surgical History:  Procedure Laterality Date  . ABDOMINAL HYSTERECTOMY    . bone spurs     feet-both feet  . CHOLECYSTECTOMY    . COLONOSCOPY    . ELBOW SURGERY     Dr Zachary George  . ESOPHAGOGASTRODUODENOSCOPY  02/2011   Medoff, NEG  . KNEE ARTHROSCOPY WITH DRILLING/MICROFRACTURE Right 05/25/2014   Procedure: KNEE ARTHROSCOPY WITH MICROFRACTURE PATELLA FEMORAL CONDYLE;  Surgeon: Mckinley Jewel, MD;  Location: [facility];  Service: Orthopedics;  Laterality: Right;  . KNEE ARTHROSCOPY WITH MEDIAL MENISECTOMY Right 05/25/2014   Procedure: RIGHT KNEE ARTHROSCOPY WITH PARTIAL MEDIAL;  Surgeon: Mckinley Jewel, MD;  Location: [facility];  Service: Orthopedics;  Laterality: Right;  . LYSIS OF ADHESION Right 05/25/2014   Procedure: LYSIS OF ADHESION;  Surgeon: Mckinley Jewel, MD;  Location: [facility];  Service: Orthopedics;  Laterality: Right;  . OOPHORECTOMY    . PARTIAL KNEE ARTHROPLASTY Right 04/17/2016   Procedure:  RIGHT KNEE UNICOMPARTMENTAL ARTHROPLASTY;  Surgeon: Loreta Ave, MD;  Location: [facility];  Service: Orthopedics;  Laterality: Right;  . REPLACEMENT TOTAL KNEE Right   . Skin removed on L leg    . THYROIDECTOMY     x 2 surgeries - partial first time  . TUBAL LIGATION      Current Medications: Current Meds  Medication Sig  . BIOTIN PO Take 1 tablet by mouth daily. Unnkown strength  . cetirizine (ZYRTEC) 10 MG tablet Take 10 mg by mouth daily.  . Cyanocobalamin (VITAMIN B-12 PO) Take 1 tablet by mouth daily.  Marland Kitchen estradiol (ESTRACE) 0.1 MG/GM vaginal cream Place 0.1 g vaginally daily.  . famotidine (PEPCID) 40 MG tablet  Take 40 mg by mouth daily.  . Flax OIL Take 1 tablet by mouth daily.  . fluorouracil (EFUDEX) 5 % cream Apply 1 application topically 2 (two) times daily.  Marland Kitchen levothyroxine (SYNTHROID) 137 MCG tablet Take 137 mcg by mouth daily.  . metoprolol succinate (TOPROL-XL) 25 MG 24 hr tablet Take 1 tablet (25 mg total) by mouth daily. Take with or immediately following a meal.  . Multiple Vitamins-Minerals (CENTRUM SILVER ADULT 50+ PO) Take 1 tablet by mouth daily. Unknown strength  . polyethylene glycol powder (GLYCOLAX/MIRALAX) 17 GM/SCOOP powder Take 17 g by mouth as needed for moderate constipation.  . valACYclovir (VALTREX) 1000 MG tablet Take 1,000 mg by mouth daily as needed (fever blisters).   . VIVELLE-DOT 0.1 MG/24HR patch Place 1 patch onto the skin 2 (two) times a week.   . zolpidem (AMBIEN) 10 MG tablet Take 10 mg by mouth at bedtime.   . [DISCONTINUED] metoprolol tartrate (LOPRESSOR) 25 MG tablet Take 1 tablet (25 mg total) by mouth 2 (two) times daily. NEEDS APPOINTMENT FOR FUTURE REFILL / 1st attempt (Patient taking differently: Take 25 mg by mouth 2 (two) times daily.)     Allergies:   Prednisone, Dog epithelium allergy skin test, Other, and Pollen extract   Social History   Socioeconomic History  . Marital status: Single    Spouse name: Not on file  . Number of children: Not on file  . Years of education: Not on file  . Highest education level: Not on file  Occupational History  . Not on file  Tobacco Use  . Smoking status: Former Smoker    Packs/day: 0.50    Years: 30.00    Pack years: 15.00    Quit date: 05/21/2011    Years since quitting: 8.9  . Smokeless tobacco: Never Used  Vaping Use  . Vaping Use: Never used  Substance and Sexual Activity  . Alcohol use: Never  . Drug use: Yes    Types: Marijuana  . Sexual activity: Not on file  Other Topics Concern  . Not on file  Social History Narrative  . Not on file   Social Determinants of Health   Financial Resource  Strain: Not on file  Food Insecurity: Not on file  Transportation Needs: Not on file  Physical Activity: Not on file  Stress: Not on file  Social Connections: Not on file     Family History: The patient's family history includes COPD in her maternal grandfather; Cancer in her father; Cancer (age of onset: 48) in her brother; Diabetes in her mother; Heart disease in her maternal grandmother and mother; Hyperlipidemia in her mother; Hypertension in her mother; Hypothyroidism in her mother; Stroke in her mother. ROS:   Please see the history of  present illness.    All 14 point review of systems negative except as described per history of present illness  EKGs/Labs/Other Studies Reviewed:      Recent Labs: No results found for requested labs within last 8760 hours.  Recent Lipid Panel    Component Value Date/Time   CHOL 174 01/25/2014 1722   TRIG 252 (H) 01/25/2014 1722   HDL 40 01/25/2014 1722   CHOLHDL 4.4 01/25/2014 1722   VLDL 50 (H) 01/25/2014 1722   LDLCALC 84 01/25/2014 1722    Physical Exam:    VS:  BP 126/82 (BP Location: Right Arm, Patient Position: Sitting)   Pulse 76   Ht 5' (1.524 m)   Wt 146 lb (66.2 kg)   SpO2 96%   BMI 28.51 kg/m     Wt Readings from Last 3 Encounters:  04/27/20 146 lb (66.2 kg)  03/01/19 149 lb (67.6 kg)  04/27/18 140 lb (63.5 kg)     GEN:  Well nourished, well developed in no acute distress HEENT: Normal NECK: No JVD; No carotid bruits LYMPHATICS: No lymphadenopathy CARDIAC: RRR, no murmurs, no rubs, no gallops RESPIRATORY:  Clear to auscultation without rales, wheezing or rhonchi  ABDOMEN: Soft, non-tender, non-distended MUSCULOSKELETAL:  No edema; No deformity  SKIN: Warm and dry LOWER EXTREMITIES: no swelling NEUROLOGIC:  Alert and oriented x 3 PSYCHIATRIC:  Normal affect   ASSESSMENT:    1. Palpitations   2. History of gastric ulcer   3. Mixed hyperlipidemia    PLAN:    In order of problems listed  above:  1. Palpitations those are controlled.  We will switch short acting from metoprolol to long-acting form she said it is difficult for her to Keep up with the schedule and take that medication twice daily. 2. Dyslipidemia will call her primary care physician to get her fasting lipid profile she tells me that everything was fine.  I did review K PN for her last visit which showed cholesterol acceptable. 3. History of peptic ulcer that seems to be stable.   Medication Adjustments/Labs and Tests Ordered: Current medicines are reviewed at length with the patient today.  Concerns regarding medicines are outlined above.  No orders of the defined types were placed in this encounter.  Medication changes:  Meds ordered this encounter  Medications  . metoprolol succinate (TOPROL-XL) 25 MG 24 hr tablet    Sig: Take 1 tablet (25 mg total) by mouth daily. Take with or immediately following a meal.    Dispense:  90 tablet    Refill:  1    Signed, Georgeanna [name], MD, [facility] 04/27/2020 4:51 PM    [facility]

## 2020-04-30 NOTE — Addendum Note (Signed)
Addended by: Darrel Reach on: 04/30/2020 08:47 AM   Modules accepted: Orders

## 2020-08-07 DIAGNOSIS — C44329 Squamous cell carcinoma of skin of other parts of face: Secondary | ICD-10-CM | POA: Diagnosis not present

## 2020-08-07 DIAGNOSIS — L905 Scar conditions and fibrosis of skin: Secondary | ICD-10-CM | POA: Diagnosis not present

## 2020-08-07 DIAGNOSIS — C44722 Squamous cell carcinoma of skin of right lower limb, including hip: Secondary | ICD-10-CM | POA: Diagnosis not present

## 2020-08-07 DIAGNOSIS — D485 Neoplasm of uncertain behavior of skin: Secondary | ICD-10-CM | POA: Diagnosis not present

## 2020-08-07 DIAGNOSIS — Z85828 Personal history of other malignant neoplasm of skin: Secondary | ICD-10-CM | POA: Diagnosis not present

## 2020-08-09 DIAGNOSIS — J0100 Acute maxillary sinusitis, unspecified: Secondary | ICD-10-CM | POA: Diagnosis not present

## 2020-09-11 DIAGNOSIS — D0471 Carcinoma in situ of skin of right lower limb, including hip: Secondary | ICD-10-CM | POA: Diagnosis not present

## 2020-09-11 DIAGNOSIS — D0439 Carcinoma in situ of skin of other parts of face: Secondary | ICD-10-CM | POA: Diagnosis not present

## 2020-10-08 DIAGNOSIS — L814 Other melanin hyperpigmentation: Secondary | ICD-10-CM | POA: Diagnosis not present

## 2020-10-08 DIAGNOSIS — L439 Lichen planus, unspecified: Secondary | ICD-10-CM | POA: Diagnosis not present

## 2020-10-08 DIAGNOSIS — C44629 Squamous cell carcinoma of skin of left upper limb, including shoulder: Secondary | ICD-10-CM | POA: Diagnosis not present

## 2020-10-08 DIAGNOSIS — L821 Other seborrheic keratosis: Secondary | ICD-10-CM | POA: Diagnosis not present

## 2020-10-08 DIAGNOSIS — D229 Melanocytic nevi, unspecified: Secondary | ICD-10-CM | POA: Diagnosis not present

## 2020-10-08 DIAGNOSIS — L570 Actinic keratosis: Secondary | ICD-10-CM | POA: Diagnosis not present

## 2020-10-08 DIAGNOSIS — D485 Neoplasm of uncertain behavior of skin: Secondary | ICD-10-CM | POA: Diagnosis not present

## 2020-11-02 ENCOUNTER — Other Ambulatory Visit: Payer: Self-pay | Admitting: Cardiology

## 2020-11-05 DIAGNOSIS — R5383 Other fatigue: Secondary | ICD-10-CM | POA: Diagnosis not present

## 2020-11-05 DIAGNOSIS — J0100 Acute maxillary sinusitis, unspecified: Secondary | ICD-10-CM | POA: Diagnosis not present

## 2020-11-09 DIAGNOSIS — Z1231 Encounter for screening mammogram for malignant neoplasm of breast: Secondary | ICD-10-CM | POA: Diagnosis not present

## 2020-11-23 DIAGNOSIS — J0181 Other acute recurrent sinusitis: Secondary | ICD-10-CM | POA: Diagnosis not present

## 2020-12-18 DIAGNOSIS — J0100 Acute maxillary sinusitis, unspecified: Secondary | ICD-10-CM | POA: Diagnosis not present

## 2020-12-18 DIAGNOSIS — J040 Acute laryngitis: Secondary | ICD-10-CM | POA: Diagnosis not present

## 2021-01-03 ENCOUNTER — Emergency Department (HOSPITAL_BASED_OUTPATIENT_CLINIC_OR_DEPARTMENT_OTHER)
Admission: EM | Admit: 2021-01-03 | Discharge: 2021-01-03 | Disposition: A | Discharge status: Home / Self Care | Payer: BC Managed Care – PPO | Attending: Emergency Medicine | Admitting: Emergency Medicine

## 2021-01-03 ENCOUNTER — Emergency Department (HOSPITAL_BASED_OUTPATIENT_CLINIC_OR_DEPARTMENT_OTHER): Payer: BC Managed Care – PPO

## 2021-01-03 ENCOUNTER — Other Ambulatory Visit: Payer: Self-pay

## 2021-01-03 ENCOUNTER — Encounter (HOSPITAL_BASED_OUTPATIENT_CLINIC_OR_DEPARTMENT_OTHER): Payer: Self-pay

## 2021-01-03 DIAGNOSIS — Z79899 Other long term (current) drug therapy: Secondary | ICD-10-CM | POA: Insufficient documentation

## 2021-01-03 DIAGNOSIS — R519 Headache, unspecified: Secondary | ICD-10-CM | POA: Diagnosis not present

## 2021-01-03 DIAGNOSIS — M519 Unspecified thoracic, thoracolumbar and lumbosacral intervertebral disc disorder: Secondary | ICD-10-CM | POA: Diagnosis not present

## 2021-01-03 DIAGNOSIS — E039 Hypothyroidism, unspecified: Secondary | ICD-10-CM | POA: Diagnosis not present

## 2021-01-03 DIAGNOSIS — Z8616 Personal history of COVID-19: Secondary | ICD-10-CM | POA: Insufficient documentation

## 2021-01-03 DIAGNOSIS — R0981 Nasal congestion: Secondary | ICD-10-CM | POA: Insufficient documentation

## 2021-01-03 DIAGNOSIS — Z87891 Personal history of nicotine dependence: Secondary | ICD-10-CM | POA: Diagnosis not present

## 2021-01-03 LAB — CBC WITH DIFFERENTIAL/PLATELET
Abs Immature Granulocytes: 0.02 10*3/uL (ref 0.00–0.07)
Basophils Absolute: 0 10*3/uL (ref 0.0–0.1)
Basophils Relative: 0 %
Eosinophils Absolute: 0.1 10*3/uL (ref 0.0–0.5)
Eosinophils Relative: 1 %
HCT: 40.1 % (ref 36.0–46.0)
Hemoglobin: 13.8 g/dL (ref 12.0–15.0)
Immature Granulocytes: 0 %
Lymphocytes Relative: 26 %
Lymphs Abs: 2 10*3/uL (ref 0.7–4.0)
MCH: 30.3 pg (ref 26.0–34.0)
MCHC: 34.4 g/dL (ref 30.0–36.0)
MCV: 87.9 fL (ref 80.0–100.0)
Monocytes Absolute: 0.7 10*3/uL (ref 0.1–1.0)
Monocytes Relative: 9 %
Neutro Abs: 4.8 10*3/uL (ref 1.7–7.7)
Neutrophils Relative %: 64 %
Platelets: 366 10*3/uL (ref 150–400)
RBC: 4.56 MIL/uL (ref 3.87–5.11)
RDW: 12.9 % (ref 11.5–15.5)
WBC: 7.6 10*3/uL (ref 4.0–10.5)
nRBC: 0 % (ref 0.0–0.2)

## 2021-01-03 LAB — BASIC METABOLIC PANEL
Anion gap: 8 (ref 5–15)
BUN: 8 mg/dL (ref 8–23)
CO2: 25 mmol/L (ref 22–32)
Calcium: 9.1 mg/dL (ref 8.9–10.3)
Chloride: 103 mmol/L (ref 98–111)
Creatinine, Ser: 0.55 mg/dL (ref 0.44–1.00)
GFR, Estimated: >60 mL/min (ref >60)
Glucose, Bld: 90 mg/dL (ref 70–99)
Potassium: 3.8 mmol/L (ref 3.5–5.1)
Sodium: 136 mmol/L (ref 135–145)

## 2021-01-03 NOTE — ED Triage Notes (Signed)
Pt c/o HA x 2 weeks-states she has been seen by PCP-abx x 3 rounds for sinus infections-NAD-steady gait

## 2021-01-03 NOTE — ED Notes (Signed)
AVS provided to and discussed with patient. Pt verbalizes understanding of discharge instructions and denies any questions or concerns at this time. Pt ambulated out of department independently with steady gait.  

## 2021-01-03 NOTE — Discharge Instructions (Signed)
Use Flonase as discussed.  Follow-up with the ENT as scheduled.  Return to emergency room if you have any worsening symptoms.

## 2021-01-03 NOTE — ED Provider Notes (Signed)
[facility] Provider Note   CSN: [id number] Arrival date & time: 01/03/21  1504     History Chief Complaint  Patient presents with   Headache    Jasmine Hahn is a 60 y.o. female.  Patient is a 60 year old female with a history of discoid lupus, GERD, hyperlipidemia and prior sinus infections who presents with headaches and sinus pain.  She said she has been treated for the last 2 months for a sinus infection that does not seem to be going away.  She said she has been on at least 3 rounds of antibiotics.  Most recently she took a steroid along with antibiotics.  She still has pain over her maxillary sinuses and some postnasal drip.  She does not use nasal spray.  She takes an over-the-counter sinus medications.  She denies any recent fevers.  Over the last 2 weeks she has been having some intermittent right-sided headaches.  She describes some sharp pains in the top of her head and going toward the back.  She rubs her scalp and they tend to go away after about 30 minutes.  She has had them with increasing frequency over the last few days.  She has some dizziness and vision changes with the pain but that resolves when the headache goes away.  No associated neck pain.  No facial pain.  No facial numbness.  No numbness or weakness to her extremities.  She has an appointment with an ENT on January 10 or 12.      Past Medical History:  Diagnosis Date   Aftercare following right knee joint replacement surgery 09/01/2017   ALLERGIC RHINITIS, SEASONAL 06/26/2006   Qualifier: Diagnosis of  By: Larose Kells MD, [place]    Allergy    Arthritis    Atypical chest pain 04/27/2018   Chronic constipation 07/13/2019   COVID-19 02/25/2019   Discoid lupus    FATIGUE 08/23/2007   Qualifier: Diagnosis of  By: Jerold Coombe     Follow-up examination after orthopedic surgery 12/28/2017   Former tobacco use 07/29/2017   GERD 06/26/2006   Qualifier: Diagnosis of  By: Larose Kells MD, [place] Berthold.     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    MAXILLARY SINUSITIS 06/26/2006   Qualifier: Diagnosis of  By: Larose Kells MD, [place] localized osteoarthritis of right knee 04/17/2016   SINUSITIS- ACUTE-NOS 12/29/2006   Qualifier: Diagnosis of  By: Larose Kells MD, [place]    Syncope    not sure why   Thyroid disease    hypo   URI 08/23/2007   Qualifier: Diagnosis of  By: Jerold Coombe     Wears partial dentures     Patient Active Problem List   Diagnosis Date Noted   Wears partial dentures    Hypothyroidism    History of hiatal hernia    Arthritis    Allergy    Chronic constipation 07/13/2019   History of gastric ulcer 07/13/2019   COVID-19 02/25/2019   Atypical chest pain 04/27/2018   Palpitations 11/24/2017  Syncope 11/24/2017   Aftercare following right knee joint replacement surgery 09/01/2017   Former tobacco use 07/29/2017   Hormone replacement therapy (HRT) 07/29/2017   Hypothyroidism, postsurgical 07/29/2017   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   Primary localized osteoarthritis of right knee 04/17/2016   GERD (gastroesophageal reflux disease)    Discoid lupus    Thyroid disease    Hyperlipidemia    URI 08/23/2007   FATIGUE 08/23/2007   SINUSITIS- ACUTE-NOS 12/29/2006   HYPOTHYROIDISM 06/26/2006   MAXILLARY SINUSITIS 06/26/2006   ALLERGIC RHINITIS, SEASONAL 06/26/2006   GERD 06/26/2006    Past Surgical History:  Procedure Laterality Date   ABDOMINAL HYSTERECTOMY     bone spurs     feet-both feet   CHOLECYSTECTOMY     COLONOSCOPY     ELBOW SURGERY     Dr Chancy Hurter    ESOPHAGOGASTRODUODENOSCOPY  02/2011   Medoff, NEG   KNEE ARTHROSCOPY WITH DRILLING/MICROFRACTURE Right 05/25/2014   Procedure: KNEE ARTHROSCOPY WITH MICROFRACTURE PATELLA FEMORAL CONDYLE;  Surgeon: Kathryne Hitch, MD;  Location: [facility];  Service: Orthopedics;  Laterality: Right;   KNEE ARTHROSCOPY WITH MEDIAL MENISECTOMY Right 05/25/2014   Procedure: RIGHT KNEE ARTHROSCOPY WITH PARTIAL MEDIAL;  Surgeon: Kathryne Hitch, MD;  Location: [facility];  Service: Orthopedics;  Laterality: Right;   LYSIS OF ADHESION Right 05/25/2014   Procedure: LYSIS OF ADHESION;  Surgeon: Kathryne Hitch, MD;  Location: [facility];  Service: Orthopedics;  Laterality: Right;   MOHS SURGERY     OOPHORECTOMY     PARTIAL KNEE ARTHROPLASTY Right 04/17/2016   Procedure: RIGHT KNEE UNICOMPARTMENTAL ARTHROPLASTY;  Surgeon: Ninetta Lights, MD;  Location: [facility];  Service: Orthopedics;  Laterality: Right;   REPLACEMENT TOTAL KNEE Right    Skin removed on L leg     THYROIDECTOMY     x 2 surgeries - partial first time   TUBAL LIGATION       OB History   No obstetric history on file.     Family History  Problem Relation Age of Onset   Stroke Mother    Hypertension Mother    Diabetes Mother    Heart disease Mother    Hyperlipidemia Mother    Hypothyroidism Mother    Cancer Father        lung   Cancer Brother 20       Colon CA   Heart disease Maternal Grandmother    COPD Maternal Grandfather     Social History   Tobacco Use   Smoking status: Former    Packs/day: 0.50    Years: 30.00    Pack years: 15.00    Types: Cigarettes    Quit date: 05/21/2011    Years since quitting: 9.6   Smokeless tobacco: Never  Vaping Use   Vaping Use: Never used  Substance Use Topics   Alcohol use: Never   Drug use: Not Currently    Types: Marijuana    Home Medications Prior to Admission medications   Medication Sig Start Date End Date Taking?  Authorizing Provider  BIOTIN PO Take 1 tablet by mouth daily. Unnkown strength    [provider]  cetirizine (ZYRTEC) 10 MG tablet Take 10 mg by mouth daily.    [provider]  Cyanocobalamin (VITAMIN B-12 PO) Take 1 tablet by mouth daily.    [provider]  estradiol (ESTRACE) 0.1 MG/GM vaginal cream Place 0.1 g vaginally  daily.    [provider]  famotidine (PEPCID) 40 MG tablet Take 40 mg by mouth daily. 02/20/20   [provider]  Flax OIL Take 1 tablet by mouth daily.    [provider]  fluorouracil (EFUDEX) 5 % cream Apply 1 application topically 2 (two) times daily. 04/06/20   [provider]  levothyroxine (SYNTHROID) 137 MCG tablet Take 137 mcg by mouth daily. 02/29/20   [provider]  metoprolol succinate (TOPROL-XL) 25 MG 24 hr tablet TAKE 1 TABLET BY MOUTH ONCE DAILY WITH OR IMMEDIATELY FOLLOWING A MEAL 11/02/20   Park Liter, MD  Multiple Vitamins-Minerals (CENTRUM SILVER ADULT 50+ PO) Take 1 tablet by mouth daily. Unknown strength    [provider]  polyethylene glycol powder (GLYCOLAX/MIRALAX) 17 GM/SCOOP powder Take 17 g by mouth as needed for moderate constipation.    [provider]  valACYclovir (VALTREX) 1000 MG tablet Take 1,000 mg by mouth daily as needed (fever blisters).  09/13/12   [provider]  VIVELLE-DOT 0.1 MG/24HR patch Place 1 patch onto the skin 2 (two) times a week.  12/07/12   [provider]  zolpidem (AMBIEN) 10 MG tablet Take 10 mg by mouth at bedtime.  11/05/12   [provider]    Allergies    Prednisone, Dog epithelium allergy skin test, Other, and Pollen extract  Review of Systems   Review of Systems  Constitutional:  Negative for chills, diaphoresis, fatigue and fever.  HENT:  Positive for congestion, postnasal drip, sinus pressure and sinus pain. Negative for rhinorrhea and sneezing.   Eyes: Negative.   Respiratory:   Negative for cough, chest tightness and shortness of breath.   Cardiovascular:  Negative for chest pain and leg swelling.  Gastrointestinal:  Negative for abdominal pain, blood in stool, diarrhea, nausea and vomiting.  Genitourinary:  Negative for difficulty urinating, flank pain, frequency and hematuria.  Musculoskeletal:  Negative for arthralgias and back pain.  Skin:  Negative for rash.  Neurological:  Positive for headaches. Negative for dizziness, speech difficulty, weakness and numbness.   Physical Exam Updated Vital Signs BP (!) 132/91 (BP Location: Right Arm)    Pulse 80    Temp 98.2 F (36.8 C) (Oral)    Resp 16    Ht 5' (1.524 m)    Wt 66.2 kg    SpO2 100%    BMI 28.51 kg/m   Physical Exam Constitutional:      Appearance: She is well-developed.  HENT:     Head: Normocephalic and atraumatic.     Comments: No pain over the temporal artery.  No rashes    Right Ear: Tympanic membrane normal.     Left Ear: Tympanic membrane normal.     Nose: Nose normal.     Mouth/Throat:     Mouth: Mucous membranes are moist.     Pharynx: Oropharynx is clear.  Eyes:     Pupils: Pupils are equal, round, and reactive to light.  Cardiovascular:     Rate and Rhythm: Normal rate and regular rhythm.     Heart sounds: Normal heart sounds.  Pulmonary:     Effort: Pulmonary effort is normal. No respiratory distress.     Breath sounds: Normal breath sounds. No wheezing or rales.  Chest:     Chest wall: No tenderness.  Abdominal:     General: Bowel sounds are normal.     Palpations: Abdomen is soft.     Tenderness: There is no  abdominal tenderness. There is no guarding or rebound.  Musculoskeletal:        General: Normal range of motion.     Cervical back: Normal range of motion and neck supple.  Lymphadenopathy:     Cervical: No cervical adenopathy.  Skin:    General: Skin is warm and dry.     Findings: No rash.  Neurological:     Mental Status: She is alert and oriented to person, place,  and time.     Comments: Motor 5/5 all extremities Sensation grossly intact to LT all extremities Finger to Nose intact, no pronator drift CN II-XII grossly intact Gait normal     ED Results / Procedures / Treatments   Labs (all labs ordered are listed, but only abnormal results are displayed) Labs Reviewed  BASIC METABOLIC PANEL  CBC WITH DIFFERENTIAL/PLATELET    EKG None  Radiology CT Head Wo Contrast  Result Date: 01/03/2021 CLINICAL DATA:  Headache, new or worsening (Age >= 50y); sinus pain, has finished 4 rounds of antibiotics without improvement EXAM: CT HEAD WITHOUT CONTRAST CT MAXILLOFACIAL WITHOUT CONTRAST TECHNIQUE: Multidetector CT imaging of the head and maxillofacial structures were performed using the standard protocol without intravenous contrast. Multiplanar CT image reconstructions of the maxillofacial structures were also generated. COMPARISON:  None. FINDINGS: CT HEAD FINDINGS Brain: There is no acute intracranial hemorrhage, mass effect, or edema. Gray-white differentiation is preserved. No extra-axial collection. Vascular: There is intracranial atherosclerotic calcification at the skull base. Skull: Unremarkable. Other: Mastoid air cells are clear. CT MAXILLOFACIAL FINDINGS Osseous: Unremarkable. Orbits: Unremarkable. Sinuses: Trace mucosal thickening. Drainage pathways are patent. Nasal septum is near midline. Soft tissues: Calcified plaque at the left greater than right ICA origins. Partially imaged metallic artifact adjacent to the left thyroid cartilage. IMPRESSION: No acute intracranial abnormality. No significant paranasal sinus inflammatory changes. Electronically Signed   By: Macy Mis M.D.   On: 01/03/2021 16:49   CT Maxillofacial Wo Contrast  Result Date: 01/03/2021 CLINICAL DATA:  Headache, new or worsening (Age >= 50y); sinus pain, has finished 4 rounds of antibiotics without improvement EXAM: CT HEAD WITHOUT CONTRAST CT MAXILLOFACIAL WITHOUT CONTRAST  TECHNIQUE: Multidetector CT imaging of the head and maxillofacial structures were performed using the standard protocol without intravenous contrast. Multiplanar CT image reconstructions of the maxillofacial structures were also generated. COMPARISON:  None. FINDINGS: CT HEAD FINDINGS Brain: There is no acute intracranial hemorrhage, mass effect, or edema. Gray-white differentiation is preserved. No extra-axial collection. Vascular: There is intracranial atherosclerotic calcification at the skull base. Skull: Unremarkable. Other: Mastoid air cells are clear. CT MAXILLOFACIAL FINDINGS Osseous: Unremarkable. Orbits: Unremarkable. Sinuses: Trace mucosal thickening. Drainage pathways are patent. Nasal septum is near midline. Soft tissues: Calcified plaque at the left greater than right ICA origins. Partially imaged metallic artifact adjacent to the left thyroid cartilage. IMPRESSION: No acute intracranial abnormality. No significant paranasal sinus inflammatory changes. Electronically Signed   By: Macy Mis M.D.   On: 01/03/2021 16:49    Procedures Procedures   Medications Ordered in ED Medications - No data to display  ED Course  I have reviewed the triage vital signs and the nursing notes.  Pertinent labs & imaging results that were available during my care of the patient were reviewed by me and considered in my medical decision making (see chart for details).    MDM Rules/Calculators/A&P  Patient is 60 year old female who presents with recurrent sinus infections and has been having some right-sided headaches.  She does not currently have a headache.  She was concerned about a possible brain tumor.  CT scan of the head and sinuses was done.  This is no acute abnormality.  No evidence of sinusitis on CT imaging.  She is otherwise well-appearing.  She is neurologically intact.  There is no other symptoms that would be more concerning for subarachnoid hemorrhage or meningitis.   No clinical suggestions of temporal arteritis.  She is currently asymptomatic.  Discussed using Flonase for her sinus congestion.  She has a follow-up appointment with ENT.  Return precautions were given.    Final Clinical Impression(s) / ED Diagnoses Final diagnoses:  Acute nonintractable headache, unspecified headache type  Sinus congestion    Rx / DC Orders ED Discharge Orders     None        Malvin Johns, MD 01/03/21 1735

## 2021-01-03 NOTE — ED Notes (Signed)
Pt agreeable to stay instead of leaving AMA at this time.

## 2021-01-03 NOTE — ED Notes (Signed)
Pt requesting to leave, states she has to go to grandson's graduation at 1800. MD aware.

## 2021-01-31 DIAGNOSIS — J343 Hypertrophy of nasal turbinates: Secondary | ICD-10-CM | POA: Diagnosis not present

## 2021-01-31 DIAGNOSIS — Z87891 Personal history of nicotine dependence: Secondary | ICD-10-CM | POA: Diagnosis not present

## 2021-01-31 DIAGNOSIS — R519 Headache, unspecified: Secondary | ICD-10-CM

## 2021-01-31 HISTORY — DX: Headache, unspecified: R51.9

## 2021-02-05 DIAGNOSIS — L905 Scar conditions and fibrosis of skin: Secondary | ICD-10-CM | POA: Diagnosis not present

## 2021-02-05 DIAGNOSIS — D0462 Carcinoma in situ of skin of left upper limb, including shoulder: Secondary | ICD-10-CM | POA: Diagnosis not present

## 2021-02-28 DIAGNOSIS — Z1329 Encounter for screening for other suspected endocrine disorder: Secondary | ICD-10-CM | POA: Diagnosis not present

## 2021-02-28 DIAGNOSIS — Z01419 Encounter for gynecological examination (general) (routine) without abnormal findings: Secondary | ICD-10-CM | POA: Diagnosis not present

## 2021-02-28 DIAGNOSIS — Z1272 Encounter for screening for malignant neoplasm of vagina: Secondary | ICD-10-CM | POA: Diagnosis not present

## 2021-02-28 DIAGNOSIS — Z6826 Body mass index (BMI) 26.0-26.9, adult: Secondary | ICD-10-CM | POA: Diagnosis not present

## 2021-04-24 ENCOUNTER — Other Ambulatory Visit: Payer: Self-pay | Admitting: Cardiology

## 2021-04-25 NOTE — Telephone Encounter (Signed)
Metoprolol Succinate  25 mg # 90 only with message patient needs appointment for future refills sent to   [facility] [place] ?

## 2021-05-08 DIAGNOSIS — J0100 Acute maxillary sinusitis, unspecified: Secondary | ICD-10-CM | POA: Diagnosis not present

## 2021-05-16 DIAGNOSIS — J0100 Acute maxillary sinusitis, unspecified: Secondary | ICD-10-CM | POA: Diagnosis not present

## 2021-06-26 ENCOUNTER — Other Ambulatory Visit: Payer: Self-pay

## 2021-06-26 ENCOUNTER — Emergency Department (HOSPITAL_BASED_OUTPATIENT_CLINIC_OR_DEPARTMENT_OTHER): Payer: BC Managed Care – PPO

## 2021-06-26 ENCOUNTER — Telehealth: Payer: Self-pay | Admitting: Cardiology

## 2021-06-26 ENCOUNTER — Emergency Department (HOSPITAL_BASED_OUTPATIENT_CLINIC_OR_DEPARTMENT_OTHER)
Admission: EM | Admit: 2021-06-26 | Discharge: 2021-06-26 | Disposition: A | Discharge status: Home / Self Care | Payer: BC Managed Care – PPO | Attending: Emergency Medicine | Admitting: Emergency Medicine

## 2021-06-26 ENCOUNTER — Encounter (HOSPITAL_BASED_OUTPATIENT_CLINIC_OR_DEPARTMENT_OTHER): Payer: Self-pay | Admitting: Urology

## 2021-06-26 DIAGNOSIS — R202 Paresthesia of skin: Secondary | ICD-10-CM | POA: Insufficient documentation

## 2021-06-26 DIAGNOSIS — R0789 Other chest pain: Secondary | ICD-10-CM | POA: Insufficient documentation

## 2021-06-26 DIAGNOSIS — Z79899 Other long term (current) drug therapy: Secondary | ICD-10-CM | POA: Insufficient documentation

## 2021-06-26 DIAGNOSIS — Z8616 Personal history of COVID-19: Secondary | ICD-10-CM | POA: Insufficient documentation

## 2021-06-26 DIAGNOSIS — R079 Chest pain, unspecified: Secondary | ICD-10-CM

## 2021-06-26 DIAGNOSIS — R200 Anesthesia of skin: Secondary | ICD-10-CM | POA: Diagnosis not present

## 2021-06-26 DIAGNOSIS — R7989 Other specified abnormal findings of blood chemistry: Secondary | ICD-10-CM | POA: Diagnosis not present

## 2021-06-26 DIAGNOSIS — E039 Hypothyroidism, unspecified: Secondary | ICD-10-CM | POA: Insufficient documentation

## 2021-06-26 DIAGNOSIS — R0602 Shortness of breath: Secondary | ICD-10-CM | POA: Diagnosis not present

## 2021-06-26 LAB — CBC
HCT: 41.6 % (ref 36.0–46.0)
Hemoglobin: 14.4 g/dL (ref 12.0–15.0)
MCH: 31 pg (ref 26.0–34.0)
MCHC: 34.6 g/dL (ref 30.0–36.0)
MCV: 89.5 fL (ref 80.0–100.0)
Platelets: 319 10*3/uL (ref 150–400)
RBC: 4.65 MIL/uL (ref 3.87–5.11)
RDW: 13 % (ref 11.5–15.5)
WBC: 8.3 10*3/uL (ref 4.0–10.5)
nRBC: 0 % (ref 0.0–0.2)

## 2021-06-26 LAB — TROPONIN I (HIGH SENSITIVITY)
Troponin I (High Sensitivity): <2 ng/L (ref <18)
Troponin I (High Sensitivity): <2 ng/L (ref <18)

## 2021-06-26 LAB — BASIC METABOLIC PANEL
Anion gap: 4 — ABNORMAL LOW (ref 5–15)
BUN: 17 mg/dL (ref 8–23)
CO2: 28 mmol/L (ref 22–32)
Calcium: 9.7 mg/dL (ref 8.9–10.3)
Chloride: 105 mmol/L (ref 98–111)
Creatinine, Ser: 1.06 mg/dL — ABNORMAL HIGH (ref 0.44–1.00)
GFR, Estimated: 59 mL/min — ABNORMAL LOW (ref >60)
Glucose, Bld: 86 mg/dL (ref 70–99)
Potassium: 3.9 mmol/L (ref 3.5–5.1)
Sodium: 137 mmol/L (ref 135–145)

## 2021-06-26 LAB — D-DIMER, QUANTITATIVE: D-Dimer, Quant: <0.27 ug/mL-FEU (ref 0.00–0.50)

## 2021-06-26 NOTE — Discharge Instructions (Addendum)
Please return to the emergency department if you have significant worsening chest pain, shortness of breath, new neurologic symptoms.  In the meantime I recommend cutting back on physical activities that cause significant worsening of your chest pain symptoms.

## 2021-06-26 NOTE — Telephone Encounter (Signed)
Called patient regarding her symptoms she reported to the the patient engagement center. Patient reports that her shortness of breath that she has had since Covid is now getting worse. She is now unable to complete activities due to being short of breath that she normaly could complete before. She also reported that she has recently started having numbness in her left hand and she has chest pressure which " feels like a lot of bricks on her chest". These symptoms come and go and are more prominent at night. When these symptoms start she has to stop what she is doing and rest for them to go away. She also has a history of SVT. Based on these symptoms I recommended to her that she go to the ER to be evaluated. Patient was agreeable with this plan and had no further questions at this time.

## 2021-06-26 NOTE — Telephone Encounter (Signed)
Pt c/o Shortness Of Breath: STAT if SOB developed within the last 24 hours or pt is noticeably SOB on the phone  1. Are you currently SOB (can you hear that pt is SOB on the phone)? no  2. How long have you been experiencing SOB? Couple of months (getting worse)  3. Are you SOB when sitting or when up moving around? Both   4. Are you currently experiencing any other symptoms? Numbness in left hand, pressure of the chest,

## 2021-06-26 NOTE — ED Provider Notes (Signed)
[place] [facility] Provider Note   CSN: [id number] Arrival date & time: 06/26/21  1836     History  Chief Complaint  Patient presents with   Chest Pain    Jasmine Hahn is a 61 y.o. female with past medical history significant for hypothyroidism, acid reflux, lupus, long-term respiratory effects of COVID who presents with concern for chest pain and chest pressure over the last 2 months worsening over time, worse when laying down at night, worse with activity.  She reports significant shortness of breath, occasional nausea associated with it.  Patient reports that she is having on and off left arm numbness and tingling for the last 2 nights, she reports a history of some tendinitis in bilateral arms and occasionally has numbness and tingling in both arms.  She reports that she has had some balance issues, as well as worsening vision that she describes as just a worsening blurring.  She reports that she has been dropping things more frequently.  She denies history of blood clots, previous ACS, hyperlipidemia, diabetes.  She reports a strong family history of ACS.  She called her cardiologist and was told to come in for evaluation for possible mini stroke as well as ACS.   Chest Pain Associated symptoms: numbness        Home Medications Prior to Admission medications   Medication Sig Start Date End Date Taking? Authorizing Provider  BIOTIN PO Take 1 tablet by mouth daily. Unnkown strength    [provider]  cetirizine (ZYRTEC) 10 MG tablet Take 10 mg by mouth daily.    [provider]  Cyanocobalamin (VITAMIN B-12 PO) Take 1 tablet by mouth daily.    [provider]  estradiol (ESTRACE) 0.1 MG/GM vaginal cream Place 0.1 g vaginally daily.    [provider]  famotidine (PEPCID) 40 MG tablet Take 40 mg by mouth daily. 02/20/20   [provider]  Flax OIL Take 1 tablet by mouth daily.    [provider]   fluorouracil (EFUDEX) 5 % cream Apply 1 application topically 2 (two) times daily. 04/06/20   [provider]  levothyroxine (SYNTHROID) 137 MCG tablet Take 137 mcg by mouth daily. 02/29/20   [provider]  metoprolol succinate (TOPROL-XL) 25 MG 24 hr tablet Take 1 tablet (25 mg total) by mouth daily. Needs appointment for future refills 04/25/21   Park Liter, MD  Multiple Vitamins-Minerals (CENTRUM SILVER ADULT 50+ PO) Take 1 tablet by mouth daily. Unknown strength    [provider]  polyethylene glycol powder (GLYCOLAX/MIRALAX) 17 GM/SCOOP powder Take 17 g by mouth as needed for moderate constipation.    [provider]  valACYclovir (VALTREX) 1000 MG tablet Take 1,000 mg by mouth daily as needed (fever blisters).  09/13/12   [provider]  VIVELLE-DOT 0.1 MG/24HR patch Place 1 patch onto the skin 2 (two) times a week.  12/07/12   [provider]  zolpidem (AMBIEN) 10 MG tablet Take 10 mg by mouth at bedtime.  11/05/12   [provider]      Allergies    Prednisone, Dog epithelium allergy skin test, Other, and Pollen extract    Review of Systems   Review of Systems  Cardiovascular:  Positive for chest pain.  Neurological:  Positive for numbness.  All other systems reviewed and are negative.   Physical Exam Updated Vital Signs BP 118/84 Comment: Simultaneous filing. User may not have seen previous data.  Pulse  67 Comment: Simultaneous filing. User may not have seen previous data.  Temp 97.9 F (36.6 C) (Oral)   Resp 15 Comment: Simultaneous filing. User may not have seen previous data.  Ht 5' (1.524 m)   Wt 66.2 kg   SpO2 99% Comment: Simultaneous filing. User may not have seen previous data.  BMI 28.51 kg/m  Physical Exam Vitals and nursing note reviewed.  Constitutional:      General: She is not in acute distress.    Appearance: Normal appearance.  HENT:     Head: Normocephalic and atraumatic.  Eyes:      General:        Right eye: No discharge.        Left eye: No discharge.  Cardiovascular:     Rate and Rhythm: Normal rate and regular rhythm.     Heart sounds: No murmur heard.    No friction rub. No gallop.  Pulmonary:     Effort: Pulmonary effort is normal.     Breath sounds: Normal breath sounds.  Abdominal:     General: Bowel sounds are normal.     Palpations: Abdomen is soft.  Skin:    General: Skin is warm and dry.     Capillary Refill: Capillary refill takes less than 2 seconds.  Neurological:     Mental Status: She is alert and oriented to person, place, and time.     Comments: Cranial nerves II through XII grossly intact, however patient does endorse a sensory discrepancy between left and right sides of face.  She describes the right side feeling less sensitive to the left.  No significant facial droop noted.  Intact finger-nose, intact heel-to-shin.  Romberg negative, gait normal.  Alert and oriented x3.  Moves all 4 limbs spontaneously, normal coordination.  No pronator drift.  Intact strength 5 out of 5 bilateral upper and lower extremities.  While patient has very minimal if present at all strength discrepancies she does endorse feeling significantly weak with left hand.  She also describes some symptoms that seem consistent with apraxia of the right hand saying that she has been frequently dropping things.    Psychiatric:        Mood and Affect: Mood normal.        Behavior: Behavior normal.     ED Results / Procedures / Treatments   Labs (all labs ordered are listed, but only abnormal results are displayed) Labs Reviewed  BASIC METABOLIC PANEL - Abnormal; Notable for the following components:      Result Value   Creatinine, Ser 1.06 (*)    GFR, Estimated 59 (*)    Anion gap 4 (*)    All other components within normal limits  CBC  D-DIMER, QUANTITATIVE  TROPONIN I (HIGH SENSITIVITY)  TROPONIN I (HIGH SENSITIVITY)    EKG EKG  Interpretation  Date/Time:  Wednesday June 26 2021 18:45:07 EDT Ventricular Rate:  67 PR Interval:  142 QRS Duration: 84 QT Interval:  378 QTC Calculation: 399 R Axis:   81 Text Interpretation: Normal sinus rhythm Normal ECG When compared with ECG of 10-Apr-2016 08:52, PREVIOUS ECG IS PRESENT inferior q waves are more pronounced Confirmed by Varney Biles [phone]) on 06/26/2021 8:33:13 PM  Radiology No results found.  Procedures Procedures    Medications Ordered in ED Medications - No data to display  ED Course/ Medical Decision Making/ A&P Clinical Course as of 06/29/21 1211  Wed Jun 26, 2021  2228 Amb referral cardiology -- staff message  G[facility] Amb referral [facility] [CP]    Clinical Course User Index [CP] Anselmo Pickler, PA-C                           Medical Decision Making Amount and/or Complexity of Data Reviewed Labs: ordered. Radiology: ordered.   This patient is a 61 y.o. female who presents to the ED for concern of chest pain, as well as some neurologic symptoms including weakness, dropping things, numbness, tingling, this involves an extensive number of treatment options, and is a complaint that carries with it a high risk of complications and morbidity. The emergent differential diagnosis prior to evaluation includes, but is not limited to, stroke, other intracranial abnormality, acute cervical spinal pathology, MS presentation, ACS, AAS, pneumonia, Boerhaave's, Mallory-Weiss, unstable angina versus other.   This is not an exhaustive differential.   Past Medical History / Co-morbidities / Social History: hypothyroidism, acid reflux, lupus, long-term respiratory effects of COVID  Additional history: Chart reviewed. Pertinent results include: Reviewed lab work and imaging from recent previous emergency department visits as well as outpatient cardiology and gastroenterology visits.  Physical Exam: Physical exam performed. The pertinent  findings include: Please see exam findings above for more detailed description, however patient with overall stable heart and lung sounds, no respiratory distress, no significant tenderness palpation of the chest wall.  Her chest pain is on and off throughout her examination today.  She does have some neurologic complaints which are worrisome including some possible apraxia of the right hand, possible strength deficit of the left side, some sensory deficits of the face.  She does not have any clear obvious neurologic deficits such as a significant strength deficit, facial droop, balance issues.  She has no clear window for when her neurologic symptoms began.  Lab Tests: I ordered, and personally interpreted labs.  The pertinent results include: Patient with a reassuring work-up today.  She has mildly elevated creatinine at 1.06.  Her CBC is otherwise unremarkable, she is troponins negative x2 and no active chest pain at time of discharge, her D-dimer is negative.  Low clinical suspicion for PE.   Imaging Studies: I ordered imaging studies including CT head without contrast, CT C-spine without contrast, plain film chest x-ray. I independently visualized and interpreted imaging which showed no acute intracranial abnormality or cervical spine abnormality, chest x-ray unremarkable, no intrathoracic pathology noted. I agree with the radiologist interpretation.   Cardiac Monitoring:  The patient was maintained on a cardiac monitor.  My attending physician Dr. Kathrynn Humble viewed and interpreted the cardiac monitored which showed an underlying rhythm of: NSR, more pronounced q waves noted since last EKG. I agree with this interpretation.  Consultations Obtained: I requested consultation with the cardiologist, spoke with the fellow and discussed lab and imaging findings as well as pertinent plan - they recommend: Urgent, but not emergent cardiology follow-up for stress test.  In context of patient's neurologic  symptoms with no clear last known well, as well as high risk chest pain with heart score 4, and symptoms that are worrisome for developing unstable angina performed extensive shared decision making with patient, my attending physician and myself.  We decided to schedule cardiac stress test on an outpatient basis, as well as ambulatory referral for neurology.  Patient understands agrees to plan, extensive return precautions given for any worsening symptoms.   Disposition: After consideration of the diagnostic results and the patients response to treatment, I feel that patient  stable for discharge at this time with no active chest pain, reassuring work-up, no acute stroke deficits, but she will need extensive follow up.   I discussed this case with my attending physician Dr. Kathrynn Humble who cosigned this note including patient's presenting symptoms, physical exam, and planned diagnostics and interventions. Attending physician stated agreement with plan or made changes to plan which were implemented.    Final Clinical Impression(s) / ED Diagnoses Final diagnoses:  Chest pain, unspecified type  Numbness and tingling  Atypical chest pain    Rx / DC Orders ED Discharge Orders          Ordered    Ambulatory referral to Neurology       Comments: An appointment is requested in approximately: 1 week. Questionable neurologic deficits, sensory discrepancies with unclear LNW. Normal CT head in free standing ED.   06/26/21 2302    Ambulatory referral to Cardiology       Comments: Within the week for stress test per Dr. Kathrynn Humble, and cardiology fellow   06/26/21 2302              Jashira Cotugno, Boronda H, PA-C 06/29/21 [place], [place], MD 07/05/21 1856

## 2021-06-26 NOTE — ED Triage Notes (Signed)
States chest pain and chest pressure over last 2 months worsening over time, worse when laying down at night  States left arm numbness and tingling the last 2 nights Told by cardiology to come here  H/o SVT

## 2021-06-27 ENCOUNTER — Telehealth: Payer: Self-pay | Admitting: Cardiology

## 2021-06-27 NOTE — Telephone Encounter (Signed)
Called to schedule patient from a referral we received from the [facility], offered her our next available with Dr. Raliegh Ip. Then I looked for another doctor and asked if she could see Dr. Ruel Favors on July 12th, and she refused. Patient states that the doctor she saw in the ED says she needs to be seen in the next week for a stress test and echo, her referral isn't urgent but if you would like to call and see if she can come sooner you can. CB # [phone]

## 2021-06-28 ENCOUNTER — Telehealth: Payer: Self-pay

## 2021-06-28 DIAGNOSIS — C4442 Squamous cell carcinoma of skin of scalp and neck: Secondary | ICD-10-CM | POA: Diagnosis not present

## 2021-06-28 DIAGNOSIS — L821 Other seborrheic keratosis: Secondary | ICD-10-CM | POA: Diagnosis not present

## 2021-06-28 DIAGNOSIS — C44329 Squamous cell carcinoma of skin of other parts of face: Secondary | ICD-10-CM | POA: Diagnosis not present

## 2021-06-28 DIAGNOSIS — D485 Neoplasm of uncertain behavior of skin: Secondary | ICD-10-CM | POA: Diagnosis not present

## 2021-06-28 NOTE — Telephone Encounter (Signed)
Spoke with pt. She was seen in the ER at [facility] 06-26-21. She was told to see Cardiologist for a stress test and ECHO within 1 week. She has not been seen here in over a year. She is asking to have the tests scheduled without an office visit. I advised she would need an appt. She was insistent that I ask a physician.  Please advise.

## 2021-06-28 NOTE — Telephone Encounter (Signed)
Called pt to let her know that Dr. Geraldo Pitter said it was medical practice and protocol to see her for an appt after hospitalization before scheduling any tests. Sent message to [facility] to schedule appt.

## 2021-06-28 NOTE — Telephone Encounter (Signed)
There's a referral in for patient, will work per sow/kbl 06/28/21

## 2021-07-02 ENCOUNTER — Other Ambulatory Visit: Payer: Self-pay

## 2021-07-03 ENCOUNTER — Ambulatory Visit: Payer: BC Managed Care – PPO | Admitting: Cardiology

## 2021-07-03 ENCOUNTER — Encounter: Payer: Self-pay | Admitting: Cardiology

## 2021-07-03 VITALS — BP 127/66 | HR 76 | Ht 60.0 in | Wt 145.2 lb

## 2021-07-03 DIAGNOSIS — Z87891 Personal history of nicotine dependence: Secondary | ICD-10-CM

## 2021-07-03 DIAGNOSIS — E782 Mixed hyperlipidemia: Secondary | ICD-10-CM | POA: Diagnosis not present

## 2021-07-03 DIAGNOSIS — R0609 Other forms of dyspnea: Secondary | ICD-10-CM

## 2021-07-03 DIAGNOSIS — I209 Angina pectoris, unspecified: Secondary | ICD-10-CM | POA: Diagnosis not present

## 2021-07-03 HISTORY — DX: Angina pectoris, unspecified: I20.9

## 2021-07-03 HISTORY — DX: Other forms of dyspnea: R06.09

## 2021-07-03 MED ORDER — ASPIRIN 81 MG PO TBEC: 81.0000 mg | DELAYED_RELEASE_TABLET | Freq: Every day | ORAL | 3 refills | Status: AC

## 2021-07-03 MED ORDER — NITROGLYCERIN 0.4 MG SL SUBL: 0.4000 mg | SUBLINGUAL_TABLET | SUBLINGUAL | 6 refills | Status: DC | PRN

## 2021-07-03 NOTE — Progress Notes (Signed)
Cardiology Office Note:    Date:  07/03/2021   ID:  Jasmine Hahn, DOB Dec 30, 1960, MRN [id number]  PCP:  Nonnie Done., MD  Cardiologist:  Garwin Brothers, MD   Referring MD: Nonnie Done., MD    ASSESSMENT:    1. Mixed hyperlipidemia   2. Angina pectoris (HCC)   3. Dyspnea on exertion   4. Former tobacco use    PLAN:    In order of problems listed above:  Primary prevention stressed to the patient.  Importance of compliance with diet and medication stressed and she vocalized understanding. Angina pectoris/shortness of breath on exertion: I discussed my findings with the patient at length and following recommendations were made.  Sublingual nitroglycerin prescription was sent, its protocol and 911 protocol explained and the patient vocalized understanding questions were answered to the patient's satisfaction.  She was advised to take a coated baby aspirin on a daily basis.In view of the patient's symptoms, I discussed with the patient options for evaluation. Invasive and noninvasive options were given to the patient. I discussed stress testing and coronary angiography and left heart catheterization at length. Benefits, pros and cons of each approach were discussed at length. Patient had multiple questions which were answered to the patient's satisfaction. Patient opted for invasive evaluation and we will set up for coronary angiography and left heart catheterization. Further recommendations will be made based on the findings with coronary angiography. In the interim if the patient has any significant symptoms in hospital to the nearest emergency room.  I also explained in detail CT coronary angiography and she is not keen on it. Mixed dyslipidemia: Followed by primary care. Ex-smoker: Promises never to go back. Recommendations will be made based on the findings of the coronary angiography.   Medication Adjustments/Labs and Tests Ordered: Current medicines are reviewed at  length with the patient today.  Concerns regarding medicines are outlined above.  No orders of the defined types were placed in this encounter.  No orders of the defined types were placed in this encounter.    No chief complaint on file.    History of Present Illness:    Jasmine Hahn is a 61 y.o. female.  Patient is previously unknown to me.  She sees my partner.  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  She has been having chest tightness and shortness of breath on exertion which is pretty significant.  It goes to the neck into the arm.  She is an ex-smoker.  She gives history of having high cholesterol in the past.  She is very concerned about it.  She tells me that her coworkers have also pointed this out to her.  At the time of my evaluation, the patient is alert awake oriented and in no distress.  Past Medical History:  Diagnosis Date   Aftercare following right knee joint replacement surgery 09/01/2017   ALLERGIC RHINITIS, SEASONAL 06/26/2006   Qualifier: Diagnosis of  By: Drue Novel MD, Nolon Rod.    Allergy    Arthritis    Atypical chest pain 04/27/2018   Chronic constipation 07/13/2019   COVID-19 02/25/2019   Discoid lupus    FATIGUE 08/23/2007   Qualifier: Diagnosis of  By: Janit Bern     Former tobacco use 07/29/2017   Frontal headache 01/31/2021   GERD 06/26/2006   Qualifier: Diagnosis of  By: Drue Novel MD, Nolon Rod.    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    Palpitations 11/24/2017   Primary localized osteoarthritis of right knee  04/17/2016   SINUSITIS- ACUTE-NOS 12/29/2006   Qualifier: Diagnosis of  By: Drue Novel MD, Nolon Rod.    Syncope    not sure why   Thyroid disease    hypo   URI 08/23/2007   Qualifier: Diagnosis of  By: Janit Bern     Wears partial dentures     Past Surgical History:  Procedure Laterality Date   ABDOMINAL HYSTERECTOMY     bone spurs     feet-both feet   CHOLECYSTECTOMY     COLONOSCOPY     ELBOW SURGERY     Dr Zachary George   ESOPHAGOGASTRODUODENOSCOPY  02/2011   Medoff, NEG   KNEE ARTHROSCOPY WITH DRILLING/MICROFRACTURE Right 05/25/2014   Procedure: KNEE ARTHROSCOPY WITH MICROFRACTURE PATELLA FEMORAL CONDYLE;  Surgeon: Mckinley Jewel, MD;  Location: [facility];  Service: Orthopedics;  Laterality: Right;   KNEE ARTHROSCOPY WITH MEDIAL MENISECTOMY Right 05/25/2014   Procedure: RIGHT KNEE ARTHROSCOPY WITH PARTIAL MEDIAL;  Surgeon: Mckinley Jewel, MD;  Location: [facility];  Service: Orthopedics;  Laterality: Right;   LYSIS OF ADHESION Right 05/25/2014   Procedure: LYSIS OF ADHESION;  Surgeon: Mckinley Jewel, MD;  Location: [facility];  Service: Orthopedics;  Laterality: Right;   MOHS SURGERY     OOPHORECTOMY     PARTIAL KNEE ARTHROPLASTY Right 04/17/2016   Procedure: RIGHT KNEE UNICOMPARTMENTAL ARTHROPLASTY;  Surgeon: Loreta Ave, MD;  Location: [facility];  Service: Orthopedics;  Laterality: Right;   REPLACEMENT TOTAL KNEE Right    Skin removed on L leg     THYROIDECTOMY     x 2 surgeries - partial first time   TUBAL LIGATION      Current Medications: Current Meds  Medication Sig   BIOTIN PO Take 1 tablet by mouth daily. Unnkown strength   cetirizine (ZYRTEC) 10 MG tablet Take 10 mg by mouth daily.   Cyanocobalamin (VITAMIN B-12 PO) Take 1 tablet by mouth daily.   famotidine (PEPCID) 40 MG tablet Take 40 mg by mouth daily.   Flax OIL Take 5 mLs by mouth daily.   levothyroxine (SYNTHROID) 125 MCG tablet Take 125 mcg by  mouth daily.   metoprolol succinate (TOPROL-XL) 25 MG 24 hr tablet Take 1 tablet (25 mg total) by mouth daily. Needs appointment for future refills   Multiple Vitamins-Minerals (CENTRUM SILVER ADULT 50+ PO) Take 1 tablet by mouth daily. Unknown strength   polyethylene glycol powder (GLYCOLAX/MIRALAX) 17 GM/SCOOP powder Take 17 g by mouth as needed for moderate constipation.   valACYclovir (VALTREX) 1000 MG tablet Take 1,000 mg by mouth daily as needed (fever blisters).    VIVELLE-DOT 0.1 MG/24HR patch Place 1 patch onto the skin 2 (two) times a week.    zolpidem (AMBIEN) 10 MG tablet Take 10 mg by mouth at bedtime.      Allergies:   Cats claw (uncaria tomentosa), Dog epithelium allergy skin test, Pollen extract, and Prednisone   Social History  Socioeconomic History   Marital status: Single    Spouse name: Not on file   Number of children: Not on file   Years of education: Not on file   Highest education level: Not on file  Occupational History   Not on file  Tobacco Use   Smoking status: Former    Packs/day: 0.50    Years: 30.00    Total pack years: 15.00    Types: Cigarettes    Quit date: 05/21/2011    Years since quitting: 10.1   Smokeless tobacco: Never  Vaping Use   Vaping Use: Never used  Substance and Sexual Activity   Alcohol use: Never   Drug use: Not Currently    Types: Marijuana   Sexual activity: Not on file  Other Topics Concern   Not on file  Social History Narrative   Not on file   Social Determinants of Health   Financial Resource Strain: Not on file  Food Insecurity: Not on file  Transportation Needs: Not on file  Physical Activity: Not on file  Stress: Not on file  Social Connections: Not on file     Family History: The patient's family history includes COPD in her maternal grandfather; Cancer in her father; Cancer (age of onset: 49) in her brother; Diabetes in her mother; Heart disease in her maternal grandmother and mother; Hyperlipidemia in her  mother; Hypertension in her mother; Hypothyroidism in her mother; Stroke in her mother.  ROS:   Please see the history of present illness.    All other systems reviewed and are negative.  EKGs/Labs/Other Studies Reviewed:    The following studies were reviewed today: EKG reveals sinus rhythm and nonspecific ST-T changes   Recent Labs: 06/26/2021: BUN 17; Creatinine, Ser 1.06; Hemoglobin 14.4; Platelets 319; Potassium 3.9; Sodium 137  Recent Lipid Panel    Component Value Date/Time   CHOL 174 01/25/2014 1722   TRIG 252 (H) 01/25/2014 1722   HDL 40 01/25/2014 1722   CHOLHDL 4.4 01/25/2014 1722   VLDL 50 (H) 01/25/2014 1722   LDLCALC 84 01/25/2014 1722    Physical Exam:    VS:  BP 127/66   Pulse 76   Ht 5' (1.524 m)   Wt 145 lb 3.2 oz (65.9 kg)   SpO2 95%   BMI 28.36 kg/m     Wt Readings from Last 3 Encounters:  07/03/21 145 lb 3.2 oz (65.9 kg)  06/26/21 146 lb (66.2 kg)  01/03/21 146 lb (66.2 kg)     GEN: Patient is in no acute distress HEENT: Normal NECK: No JVD; No carotid bruits LYMPHATICS: No lymphadenopathy CARDIAC: Hear sounds regular, 2/6 systolic murmur at the apex. RESPIRATORY:  Clear to auscultation without rales, wheezing or rhonchi  ABDOMEN: Soft, non-tender, non-distended MUSCULOSKELETAL:  No edema; No deformity  SKIN: Warm and dry NEUROLOGIC:  Alert and oriented x 3 PSYCHIATRIC:  Normal affect   Signed, Garwin Brothers, MD  07/03/2021 1:30 PM    [facility]

## 2021-07-03 NOTE — Patient Instructions (Signed)
Medication Instructions:  Your physician has recommended you make the following change in your medication:   Take 81 mg coated aspirin daily. Use nitroglycerin as needed for chest pain.  *If you need a refill on your cardiac medications before your next appointment, please call your pharmacy*   Lab Work: Your physician recommends that you have a BMET and CBC today in the office for your upcoming procedure.  If you have labs (blood work) drawn today and your tests are completely normal, you will receive your results only by: [place] (if you have MyChart) OR A paper copy in the mail If you have any lab test that is abnormal or we need to change your treatment, we will call you to review the results.   Testing/Procedures:  [place] [address] Dept: [phone] Loc: [place]  07/03/2021  You are scheduled for a Cardiac Catheterization on Friday, June 16 with Dr. Shelva Majestic.  1. Please arrive at the Main Entrance A at [facility]: [place], [place] [address] at 5:30 AM (This time is two hours before your procedure to ensure your preparation). Free valet parking service is available.   Special note: Every effort is made to have your procedure done on time. Please understand that emergencies sometimes delay scheduled procedures.  2. Diet: Do not eat solid foods after midnight.  You may have clear liquids until 5 AM upon the day of the procedure.  3. Labs: You had your labs done today in the office.  4. Medication instructions in preparation for your procedure:   Contrast Allergy: No  On the morning of your procedure, take Aspirin and any morning medicines NOT listed above.  You may use sips of water.  5. Plan to go home the same day, you will only stay overnight if medically necessary. 6. You MUST have a responsible  adult to drive you home. 7. An adult MUST be with you the first 24 hours after you arrive home. 8. Bring a current list of your medications, and the last time and date medication taken. 9. Bring ID and current insurance cards. 10.Please wear clothes that are easy to get on and off and wear slip-on shoes.  Thank you for allowing [place] to care for you!   -- [facility]    Follow-Up: At [facility], you and your health needs are our priority.  As part of our continuing mission to provide you with exceptional heart care, we have created designated Provider Care Teams.  These Care Teams include your primary Cardiologist (physician) and Advanced Practice Providers (APPs -  Physician Assistants and Nurse Practitioners) who all work together to provide you with the care you need, when you need it.  We recommend signing up for the patient portal called "MyChart".  Sign up information is provided on this After Visit Summary.  MyChart is used to connect with patients for Virtual Visits (Telemedicine).  Patients are able to view lab/test results, encounter notes, upcoming appointments, etc.  Non-urgent messages can be sent to your provider as well.   To learn more about what you can do with MyChart, go to [email or web address].    Your next appointment:   1 month(s)  The format for your next appointment:   In Person  Provider:   Jenne Campus, MD   Other Instructions  Coronary Angiogram With Stent Coronary angiogram  with stent placement is a procedure to widen or open a narrow blood vessel of the heart (coronary artery). Arteries may become blocked by cholesterol buildup (plaques) in the lining of the artery wall. When a coronary artery becomes partially blocked, blood flow to that area decreases. This may lead to chest pain or a heart attack (myocardial infarction). A stent is a small piece of metal that looks like mesh or spring. Stent placement may be done  as treatment after a heart attack, or to prevent a heart attack if a blocked artery is found by a coronary angiogram. Let your health care provider know about: Any allergies you have, including allergies to medicines or contrast dye. All medicines you are taking, including vitamins, herbs, eye drops, creams, and over-the-counter medicines. Any problems you or family members have had with anesthetic medicines. Any blood disorders you have. Any surgeries you have had. Any medical conditions you have, including kidney problems or kidney failure. Whether you are pregnant or may be pregnant. Whether you are breastfeeding. What are the risks? Generally, this is a safe procedure. However, serious problems may occur, including: Damage to nearby structures or organs, such as the heart, blood vessels, or kidneys. A return of blockage. Bleeding, infection, or bruising at the insertion site. A collection of blood under the skin (hematoma) at the insertion site. A blood clot in another part of the body. Allergic reaction to medicines or dyes. Bleeding into the abdomen (retroperitoneal bleeding). Stroke (rare). Heart attack (rare). What happens before the procedure? Staying hydrated Follow instructions from your health care provider about hydration, which may include: Up to 2 hours before the procedure - you may continue to drink clear liquids, such as water, clear fruit juice, black coffee, and plain tea.    Eating and drinking restrictions Follow instructions from your health care provider about eating and drinking, which may include: 8 hours before the procedure - stop eating heavy meals or foods, such as meat, fried foods, or fatty foods. 6 hours before the procedure - stop eating light meals or foods, such as toast or cereal. 2 hours before the procedure - stop drinking clear liquids. Medicines Ask your health care provider about: Changing or stopping your regular medicines. This is  especially important if you are taking diabetes medicines or blood thinners. Taking medicines such as aspirin and ibuprofen. These medicines can thin your blood. Do not take these medicines unless your health care provider tells you to take them. Generally, aspirin is recommended before a thin tube, called a catheter, is passed through a blood vessel and inserted into the heart (cardiac catheterization). Taking over-the-counter medicines, vitamins, herbs, and supplements. General instructions Do not use any products that contain nicotine or tobacco for at least 4 weeks before the procedure. These products include cigarettes, e-cigarettes, and chewing tobacco. If you need help quitting, ask your health care provider. Plan to have someone take you home from the hospital or clinic. If you will be going home right after the procedure, plan to have someone with you for 24 hours. You may have tests and imaging procedures. Ask your health care provider: How your insertion site will be marked. Ask which artery will be used for the procedure. What steps will be taken to help prevent infection. These may include: Removing hair at the insertion site. Washing skin with a germ-killing soap. Taking antibiotic medicine. What happens during the procedure? An IV will be inserted into one of your veins. Electrodes may be placed on  your chest to monitor your heart rate during the procedure. You will be given one or more of the following: A medicine to help you relax (sedative). A medicine to numb the area (local anesthetic) for catheter insertion. A small incision will be made for catheter insertion. The catheter will be inserted into an artery using a guide wire. The location may be in your groin, your wrist, or the fold of your arm (near your elbow). An X-ray procedure (fluoroscopy) will be used to help guide the catheter to the opening of the heart arteries. A dye will be injected into the catheter. X-rays  will be taken. The dye helps to show where any narrowing or blockages are located in the arteries. Tell your health care provider if you have chest pain or trouble breathing. A tiny wire will be guided to the blocked spot, and a balloon will be inflated to make the artery wider. The stent will be expanded to crush the plaques into the wall of the vessel. The stent will hold the area open and improve the blood flow. Most stents have a drug coating to reduce the risk of the stent narrowing over time. The artery may be made wider using a drill, laser, or other tools that remove plaques. The catheter will be removed when the blood flow improves. The stent will stay where it was placed, and the lining of the artery will grow over it. A bandage (dressing) will be placed on the insertion site. Pressure will be applied to stop bleeding. The IV will be removed. This procedure may vary among health care providers and hospitals.    What happens after the procedure? Your blood pressure, heart rate, breathing rate, and blood oxygen level will be monitored until you leave the hospital or clinic. If the procedure is done through the leg, you will lie flat in bed for a few hours or for as long as told by your health care provider. You will be instructed not to bend or cross your legs. The insertion site and the pulse in your foot or wrist will be checked often. You may have more blood tests, X-rays, and a test that records the electrical activity of your heart (electrocardiogram, or ECG). Do not drive for 24 hours if you were given a sedative during your procedure. Summary Coronary angiogram with stent placement is a procedure to widen or open a narrowed coronary artery. This is done to treat heart problems. Before the procedure, let your health care provider know about all the medical conditions and surgeries you have or have had. This is a safe procedure. However, some problems may occur, including damage to  nearby structures or organs, bleeding, blood clots, or allergies. Follow your health care provider's instructions about eating, drinking, medicines, and other lifestyle changes, such as quitting tobacco use before the procedure. This information is not intended to replace advice given to you by your health care provider. Make sure you discuss any questions you have with your health care provider. Document Revised: 07/28/2018 Document Reviewed: 07/28/2018 Elsevier Patient Education  2021 [place].  Aspirin and Your Heart Aspirin is a medicine that prevents the platelets in your blood from sticking together. Platelets are the cells that your blood uses for clotting. Aspirin can be used to help reduce the risk of blood clots, heart attacks, and other heart-related problems. What are the risks? Daily use of aspirin can cause side effects. Some of these include: Bleeding. Bleeding can be minor or serious. An  example of minor bleeding is bleeding from a cut, and the bleeding does not stop. An example of more serious bleeding is stomach bleeding or, rarely, bleeding into the brain. Your risk of bleeding increases if you are also taking NSAIDs, such as ibuprofen. Increased bruising. Upset stomach. An allergic reaction. People who have growths inside the nose (nasal polyps) have an increased risk of developing an aspirin allergy. How to use aspirin to care for your heart Take aspirin only as told by your health care provider. Make sure that you understand how much to take and what form to take. The two forms of aspirin are: Non-enteric-coated.This type of aspirin does not have a coating and is absorbed quickly. This type of aspirin also comes in a chewable form. Enteric-coated. This type of aspirin has a coating that releases the medicine very slowly. Enteric-coated aspirin might cause less stomach upset than non-enteric-coated aspirin. This type of aspirin should not be chewed or crushed. Work with  your health care provider to find out whether it is safe and beneficial for you to take aspirin daily. Taking aspirin daily may be helpful if: You have had a heart attack or chest pain, or you are at risk for a heart attack. You have a condition in which certain heart vessels are blocked (coronary artery disease), and you have had a procedure to treat it. Examples are: Open-heart surgery, such as coronary artery bypass surgery (CABG). Coronary angioplasty,which is done to widen a blood vessel of your heart. Having a small mesh tube, or stent, placed in your coronary artery. You have had certain types of stroke or a mini-stroke known as a transient ischemic attack (TIA). You have a narrowing of the arteries that supply the limbs (peripheral artery disease, or PAD). You have long-term (chronic) heart rhythm problems, such as atrial fibrillation, and your health care provider thinks aspirin may help. You have valve disease or have had surgery on a valve. You are considered at increased risk of developing coronary artery disease or PAD.    Follow these instructions at home Medicines Take over-the-counter and prescription medicines only as told by your health care provider. If you are taking blood thinners: Talk with your health care provider before you take any medicines that contain aspirin or NSAIDs, such as ibuprofen. These medicines increase your risk for dangerous bleeding. Take your medicine exactly as told, at the same time every day. Avoid activities that could cause injury or bruising, and follow instructions about how to prevent falls. Wear a medical alert bracelet or carry a card that lists what medicines you take. General instructions Do not drink alcohol if: Your health care provider tells you not to drink. You are pregnant, may be pregnant, or are planning to become pregnant. If you drink alcohol: Limit how much you use to: 0-1 drink a day for women. 0-2 drinks a day for men. Be  aware of how much alcohol is in your drink. In the [place] one drink equals one 12 oz bottle of beer (355 mL), one 5 oz glass of wine (148 mL), or one 1 oz glass of hard liquor (44 mL). Keep all follow-up visits as told by your health care provider. This is important. Where to find more information The American Heart Association: www.heart.org Contact a health care provider if you have: Unusual bleeding or bruising. Stomach pain or nausea. Ringing in your ears. An allergic reaction that causes hives, itchy skin, or swelling of the lips, tongue, or face. Get help  right away if: You notice that your bowel movements are bloody, or dark red or black in color. You vomit or cough up blood. You have blood in your urine. You cough, breathe loudly (wheeze), or feel short of breath. You have chest pain, especially if the pain spreads to your arms, back, neck, or jaw. You have a headache with confusion. You have any symptoms of a stroke. "BE FAST" is an easy way to remember the main warning signs of a stroke: B - Balance. Signs are dizziness, sudden trouble walking, or loss of balance. E - Eyes. Signs are trouble seeing or a sudden change in vision. F - Face. Signs are sudden weakness or numbness of the face, or the face or eyelid drooping on one side. A - Arms. Signs are weakness or numbness in an arm. This happens suddenly and usually on one side of the body. S - Speech. Signs are sudden trouble speaking, slurred speech, or trouble understanding what people say. T - Time. Time to call emergency services. Write down what time symptoms started. You have other signs of a stroke, such as: A sudden, severe headache with no known cause. Nausea or vomiting. Seizure. These symptoms may represent a serious problem that is an emergency. Do not wait to see if the symptoms will go away. Get medical help right away. Call your local emergency services (911 in the [place] Do not drive yourself to the  hospital. Summary Aspirin use can help reduce the risk of blood clots, heart attacks, and other heart-related problems. Daily use of aspirin can cause side effects. Take aspirin only as told by your health care provider. Make sure that you understand how much to take and what form to take. Your health care provider will help you determine whether it is safe and beneficial for you to take aspirin daily. This information is not intended to replace advice given to you by your health care provider. Make sure you discuss any questions you have with your health care provider. Document Revised: 10/11/2018 Document Reviewed: 10/11/2018 Elsevier Patient Education  2021 [place]. Nitroglycerin sublingual tablets What is this medicine? NITROGLYCERIN (nye troe GLI ser in) is a type of vasodilator. It relaxes blood vessels, increasing the blood and oxygen supply to your heart. This medicine is used to relieve chest pain caused by angina. It is also used to prevent chest pain before activities like climbing stairs, going outdoors in cold weather, or sexual activity. This medicine may be used for other purposes; ask your health care provider or pharmacist if you have questions. COMMON BRAND NAME(S): Nitroquick, Nitrostat, Nitrotab What should I tell my health care provider before I take this medicine? They need to know if you have any of these conditions: anemia head injury, recent stroke, or bleeding in the brain liver disease previous heart attack an unusual or allergic reaction to nitroglycerin, other medicines, foods, dyes, or preservatives pregnant or trying to get pregnant breast-feeding How should I use this medicine? Take this medicine by mouth as needed. Use at the first sign of an angina attack (chest pain or tightness). You can also take this medicine 5 to 10 minutes before an event likely to produce chest pain. Follow the directions exactly as written on the prescription label. Place one  tablet under your tongue and let it dissolve. Do not swallow whole. Replace the dose if you accidentally swallow it. It will help if your mouth is not dry. Saliva around the tablet will help it to  dissolve more quickly. Do not eat or drink, smoke or chew tobacco while a tablet is dissolving. Sit down when taking this medicine. In an angina attack, you should feel better within 5 minutes after your first dose. You can take a dose every 5 minutes up to a total of 3 doses. If you do not feel better or feel worse after 1 dose, call 9-1-1 at once. Do not take more than 3 doses in 15 minutes. Your health care provider might give you other directions. Follow those directions if he or she does. Do not take your medicine more often than directed. Talk to your health care provider about the use of this medicine in children. Special care may be needed. Overdosage: If you think you have taken too much of this medicine contact a poison control center or emergency room at once. NOTE: This medicine is only for you. Do not share this medicine with others. What if I miss a dose? This does not apply. This medicine is only used as needed. What may interact with this medicine? Do not take this medicine with any of the following medications: certain migraine medicines like ergotamine and dihydroergotamine (DHE) medicines used to treat erectile dysfunction like sildenafil, tadalafil, and vardenafil riociguat This medicine may also interact with the following medications: alteplase aspirin heparin medicines for high blood pressure medicines for mental depression other medicines used to treat angina phenothiazines like chlorpromazine, mesoridazine, prochlorperazine, thioridazine This list may not describe all possible interactions. Give your health care provider a list of all the medicines, herbs, non-prescription drugs, or dietary supplements you use. Also tell them if you smoke, drink alcohol, or use illegal drugs.  Some items may interact with your medicine. What should I watch for while using this medicine? Tell your doctor or health care professional if you feel your medicine is no longer working. Keep this medicine with you at all times. Sit or lie down when you take your medicine to prevent falling if you feel dizzy or faint after using it. Try to remain calm. This will help you to feel better faster. If you feel dizzy, take several deep breaths and lie down with your feet propped up, or bend forward with your head resting between your knees. You may get drowsy or dizzy. Do not drive, use machinery, or do anything that needs mental alertness until you know how this drug affects you. Do not stand or sit up quickly, especially if you are an older patient. This reduces the risk of dizzy or fainting spells. Alcohol can make you more drowsy and dizzy. Avoid alcoholic drinks. Do not treat yourself for coughs, colds, or pain while you are taking this medicine without asking your doctor or health care professional for advice. Some ingredients may increase your blood pressure. What side effects may I notice from receiving this medicine? Side effects that you should report to your doctor or health care professional as soon as possible: allergic reactions (skin rash, itching or hives; swelling of the face, lips, or tongue) low blood pressure (dizziness; feeling faint or lightheaded, falls; unusually weak or tired) low red blood cell counts (trouble breathing; feeling faint; lightheaded, falls; unusually weak or tired) Side effects that usually do not require medical attention (report to your doctor or health care professional if they continue or are bothersome): facial flushing (redness) headache nausea, vomiting This list may not describe all possible side effects. Call your doctor for medical advice about side effects. You may report side effects to  FDA at 1-800-FDA-1088. Where should I keep my medicine? Keep out  of the reach of children. Store at room temperature between 20 and 25 degrees C (68 and 77 degrees F). Store in [employer]. Protect from light and moisture. Keep tightly closed. Throw away any unused medicine after the expiration date. NOTE: This sheet is a summary. It may not cover all possible information. If you have questions about this medicine, talk to your doctor, pharmacist, or health care provider.  2021 Elsevier/Gold Standard (2017-10-07 16:46:32)

## 2021-07-03 NOTE — H&P (View-Only) (Signed)
Cardiology Office Note:    Date:  07/03/2021   ID:  Jasmine Hahn, DOB Dec 30, 1960, MRN [id number]  PCP:  Nonnie Done., MD  Cardiologist:  Garwin Brothers, MD   Referring MD: Nonnie Done., MD    ASSESSMENT:    1. Mixed hyperlipidemia   2. Angina pectoris (HCC)   3. Dyspnea on exertion   4. Former tobacco use    PLAN:    In order of problems listed above:  Primary prevention stressed to the patient.  Importance of compliance with diet and medication stressed and she vocalized understanding. Angina pectoris/shortness of breath on exertion: I discussed my findings with the patient at length and following recommendations were made.  Sublingual nitroglycerin prescription was sent, its protocol and 911 protocol explained and the patient vocalized understanding questions were answered to the patient's satisfaction.  She was advised to take a coated baby aspirin on a daily basis.In view of the patient's symptoms, I discussed with the patient options for evaluation. Invasive and noninvasive options were given to the patient. I discussed stress testing and coronary angiography and left heart catheterization at length. Benefits, pros and cons of each approach were discussed at length. Patient had multiple questions which were answered to the patient's satisfaction. Patient opted for invasive evaluation and we will set up for coronary angiography and left heart catheterization. Further recommendations will be made based on the findings with coronary angiography. In the interim if the patient has any significant symptoms in hospital to the nearest emergency room.  I also explained in detail CT coronary angiography and she is not keen on it. Mixed dyslipidemia: Followed by primary care. Ex-smoker: Promises never to go back. Recommendations will be made based on the findings of the coronary angiography.   Medication Adjustments/Labs and Tests Ordered: Current medicines are reviewed at  length with the patient today.  Concerns regarding medicines are outlined above.  No orders of the defined types were placed in this encounter.  No orders of the defined types were placed in this encounter.    No chief complaint on file.    History of Present Illness:    Jasmine Hahn is a 61 y.o. female.  Patient is previously unknown to me.  She sees my partner.  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  She has been having chest tightness and shortness of breath on exertion which is pretty significant.  It goes to the neck into the arm.  She is an ex-smoker.  She gives history of having high cholesterol in the past.  She is very concerned about it.  She tells me that her coworkers have also pointed this out to her.  At the time of my evaluation, the patient is alert awake oriented and in no distress.  Past Medical History:  Diagnosis Date   Aftercare following right knee joint replacement surgery 09/01/2017   ALLERGIC RHINITIS, SEASONAL 06/26/2006   Qualifier: Diagnosis of  By: Drue Novel MD, Nolon Rod.    Allergy    Arthritis    Atypical chest pain 04/27/2018   Chronic constipation 07/13/2019   COVID-19 02/25/2019   Discoid lupus    FATIGUE 08/23/2007   Qualifier: Diagnosis of  By: Janit Bern     Former tobacco use 07/29/2017   Frontal headache 01/31/2021   GERD 06/26/2006   Qualifier: Diagnosis of  By: Drue Novel MD, Nolon Rod.    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    Palpitations 11/24/2017   Primary localized osteoarthritis of right knee  04/17/2016   SINUSITIS- ACUTE-NOS 12/29/2006   Qualifier: Diagnosis of  By: Drue Novel MD, Nolon Rod.    Syncope    not sure why   Thyroid disease    hypo   URI 08/23/2007   Qualifier: Diagnosis of  By: Janit Bern     Wears partial dentures     Past Surgical History:  Procedure Laterality Date   ABDOMINAL HYSTERECTOMY     bone spurs     feet-both feet   CHOLECYSTECTOMY     COLONOSCOPY     ELBOW SURGERY     Dr Zachary George   ESOPHAGOGASTRODUODENOSCOPY  02/2011   Medoff, NEG   KNEE ARTHROSCOPY WITH DRILLING/MICROFRACTURE Right 05/25/2014   Procedure: KNEE ARTHROSCOPY WITH MICROFRACTURE PATELLA FEMORAL CONDYLE;  Surgeon: Mckinley Jewel, MD;  Location: [facility];  Service: Orthopedics;  Laterality: Right;   KNEE ARTHROSCOPY WITH MEDIAL MENISECTOMY Right 05/25/2014   Procedure: RIGHT KNEE ARTHROSCOPY WITH PARTIAL MEDIAL;  Surgeon: Mckinley Jewel, MD;  Location: [facility];  Service: Orthopedics;  Laterality: Right;   LYSIS OF ADHESION Right 05/25/2014   Procedure: LYSIS OF ADHESION;  Surgeon: Mckinley Jewel, MD;  Location: [facility];  Service: Orthopedics;  Laterality: Right;   MOHS SURGERY     OOPHORECTOMY     PARTIAL KNEE ARTHROPLASTY Right 04/17/2016   Procedure: RIGHT KNEE UNICOMPARTMENTAL ARTHROPLASTY;  Surgeon: Loreta Ave, MD;  Location: [facility];  Service: Orthopedics;  Laterality: Right;   REPLACEMENT TOTAL KNEE Right    Skin removed on L leg     THYROIDECTOMY     x 2 surgeries - partial first time   TUBAL LIGATION      Current Medications: Current Meds  Medication Sig   BIOTIN PO Take 1 tablet by mouth daily. Unnkown strength   cetirizine (ZYRTEC) 10 MG tablet Take 10 mg by mouth daily.   Cyanocobalamin (VITAMIN B-12 PO) Take 1 tablet by mouth daily.   famotidine (PEPCID) 40 MG tablet Take 40 mg by mouth daily.   Flax OIL Take 5 mLs by mouth daily.   levothyroxine (SYNTHROID) 125 MCG tablet Take 125 mcg by  mouth daily.   metoprolol succinate (TOPROL-XL) 25 MG 24 hr tablet Take 1 tablet (25 mg total) by mouth daily. Needs appointment for future refills   Multiple Vitamins-Minerals (CENTRUM SILVER ADULT 50+ PO) Take 1 tablet by mouth daily. Unknown strength   polyethylene glycol powder (GLYCOLAX/MIRALAX) 17 GM/SCOOP powder Take 17 g by mouth as needed for moderate constipation.   valACYclovir (VALTREX) 1000 MG tablet Take 1,000 mg by mouth daily as needed (fever blisters).    VIVELLE-DOT 0.1 MG/24HR patch Place 1 patch onto the skin 2 (two) times a week.    zolpidem (AMBIEN) 10 MG tablet Take 10 mg by mouth at bedtime.      Allergies:   Cats claw (uncaria tomentosa), Dog epithelium allergy skin test, Pollen extract, and Prednisone   Social History  Socioeconomic History   Marital status: Single    Spouse name: Not on file   Number of children: Not on file   Years of education: Not on file   Highest education level: Not on file  Occupational History   Not on file  Tobacco Use   Smoking status: Former    Packs/day: 0.50    Years: 30.00    Total pack years: 15.00    Types: Cigarettes    Quit date: 05/21/2011    Years since quitting: 10.1   Smokeless tobacco: Never  Vaping Use   Vaping Use: Never used  Substance and Sexual Activity   Alcohol use: Never   Drug use: Not Currently    Types: Marijuana   Sexual activity: Not on file  Other Topics Concern   Not on file  Social History Narrative   Not on file   Social Determinants of Health   Financial Resource Strain: Not on file  Food Insecurity: Not on file  Transportation Needs: Not on file  Physical Activity: Not on file  Stress: Not on file  Social Connections: Not on file     Family History: The patient's family history includes COPD in her maternal grandfather; Cancer in her father; Cancer (age of onset: 49) in her brother; Diabetes in her mother; Heart disease in her maternal grandmother and mother; Hyperlipidemia in her  mother; Hypertension in her mother; Hypothyroidism in her mother; Stroke in her mother.  ROS:   Please see the history of present illness.    All other systems reviewed and are negative.  EKGs/Labs/Other Studies Reviewed:    The following studies were reviewed today: EKG reveals sinus rhythm and nonspecific ST-T changes   Recent Labs: 06/26/2021: BUN 17; Creatinine, Ser 1.06; Hemoglobin 14.4; Platelets 319; Potassium 3.9; Sodium 137  Recent Lipid Panel    Component Value Date/Time   CHOL 174 01/25/2014 1722   TRIG 252 (H) 01/25/2014 1722   HDL 40 01/25/2014 1722   CHOLHDL 4.4 01/25/2014 1722   VLDL 50 (H) 01/25/2014 1722   LDLCALC 84 01/25/2014 1722    Physical Exam:    VS:  BP 127/66   Pulse 76   Ht 5' (1.524 m)   Wt 145 lb 3.2 oz (65.9 kg)   SpO2 95%   BMI 28.36 kg/m     Wt Readings from Last 3 Encounters:  07/03/21 145 lb 3.2 oz (65.9 kg)  06/26/21 146 lb (66.2 kg)  01/03/21 146 lb (66.2 kg)     GEN: Patient is in no acute distress HEENT: Normal NECK: No JVD; No carotid bruits LYMPHATICS: No lymphadenopathy CARDIAC: Hear sounds regular, 2/6 systolic murmur at the apex. RESPIRATORY:  Clear to auscultation without rales, wheezing or rhonchi  ABDOMEN: Soft, non-tender, non-distended MUSCULOSKELETAL:  No edema; No deformity  SKIN: Warm and dry NEUROLOGIC:  Alert and oriented x 3 PSYCHIATRIC:  Normal affect   Signed, Garwin Brothers, MD  07/03/2021 1:30 PM    [facility]

## 2021-07-03 NOTE — Addendum Note (Signed)
Addended by: Truddie Hidden on: 07/03/2021 01:38 PM   Modules accepted: Orders

## 2021-07-04 ENCOUNTER — Telehealth: Payer: Self-pay | Admitting: *Deleted

## 2021-07-04 LAB — BASIC METABOLIC PANEL
BUN/Creatinine Ratio: 14 (ref 12–28)
BUN: 10 mg/dL (ref 8–27)
CO2: 23 mmol/L (ref 20–29)
Calcium: 9.6 mg/dL (ref 8.7–10.3)
Chloride: 102 mmol/L (ref 96–106)
Creatinine, Ser: 0.7 mg/dL (ref 0.57–1.00)
Glucose: 80 mg/dL (ref 70–99)
Potassium: 4.5 mmol/L (ref 3.5–5.2)
Sodium: 140 mmol/L (ref 134–144)
eGFR: 98 mL/min/{1.73_m2} (ref >59)

## 2021-07-04 LAB — CBC WITH DIFFERENTIAL/PLATELET
Basophils Absolute: 0.1 10*3/uL (ref 0.0–0.2)
Basos: 1 %
EOS (ABSOLUTE): 0.1 10*3/uL (ref 0.0–0.4)
Eos: 1 %
Hematocrit: 43.1 % (ref 34.0–46.6)
Hemoglobin: 14.5 g/dL (ref 11.1–15.9)
Immature Grans (Abs): 0 10*3/uL (ref 0.0–0.1)
Immature Granulocytes: 0 %
Lymphocytes Absolute: 2.2 10*3/uL (ref 0.7–3.1)
Lymphs: 30 %
MCH: 30.5 pg (ref 26.6–33.0)
MCHC: 33.6 g/dL (ref 31.5–35.7)
MCV: 91 fL (ref 79–97)
Monocytes Absolute: 0.8 10*3/uL (ref 0.1–0.9)
Monocytes: 11 %
Neutrophils Absolute: 4.1 10*3/uL (ref 1.4–7.0)
Neutrophils: 57 %
Platelets: 339 10*3/uL (ref 150–450)
RBC: 4.75 x10E6/uL (ref 3.77–5.28)
RDW: 12.8 % (ref 11.7–15.4)
WBC: 7.2 10*3/uL (ref 3.4–10.8)

## 2021-07-04 NOTE — Telephone Encounter (Signed)
Cardiac Catheterization scheduled at [facility] for: Friday July 05, 2021 7:30 AM Arrival time and place: [place] Entrance A at: 5:30 AM   Nothing to eat after midnight prior to procedure, clear liquids until 5 AM day of procedure.  Medication instructions: -Usual morning medications can be taken with sips of water including aspirin 81 mg.  Confirmed patient has responsible adult to drive home post procedure and be with patient first 24 hours after arriving home.  Patient reports no new symptoms concerning for COVID-19.  Reviewed procedure instructions with patient.

## 2021-07-05 ENCOUNTER — Other Ambulatory Visit: Payer: Self-pay

## 2021-07-05 ENCOUNTER — Ambulatory Visit (HOSPITAL_COMMUNITY)
Admission: RE | Admit: 2021-07-05 | Discharge: 2021-07-05 | Disposition: A | Discharge status: Home / Self Care | Payer: BC Managed Care – PPO | Attending: Cardiovascular Disease | Admitting: Cardiovascular Disease

## 2021-07-05 ENCOUNTER — Encounter (HOSPITAL_COMMUNITY): Payer: Self-pay | Admitting: Cardiovascular Disease

## 2021-07-05 ENCOUNTER — Encounter (HOSPITAL_COMMUNITY)
Admission: RE | Disposition: A | Discharge status: Home / Self Care | Payer: Self-pay | Source: Home / Self Care | Attending: Cardiovascular Disease

## 2021-07-05 ENCOUNTER — Telehealth: Payer: Self-pay

## 2021-07-05 DIAGNOSIS — I25119 Atherosclerotic heart disease of native coronary artery with unspecified angina pectoris: Secondary | ICD-10-CM | POA: Insufficient documentation

## 2021-07-05 DIAGNOSIS — E782 Mixed hyperlipidemia: Secondary | ICD-10-CM

## 2021-07-05 DIAGNOSIS — Z87891 Personal history of nicotine dependence: Secondary | ICD-10-CM | POA: Diagnosis not present

## 2021-07-05 DIAGNOSIS — R0609 Other forms of dyspnea: Secondary | ICD-10-CM

## 2021-07-05 DIAGNOSIS — I209 Angina pectoris, unspecified: Secondary | ICD-10-CM

## 2021-07-05 HISTORY — PX: LEFT HEART CATH AND CORONARY ANGIOGRAPHY: CATH118249

## 2021-07-05 SURGERY — LEFT HEART CATH AND CORONARY ANGIOGRAPHY
Anesthesia: LOCAL

## 2021-07-05 MED ORDER — MIDAZOLAM HCL 2 MG/2ML IJ SOLN
INTRAMUSCULAR | Status: AC
  Filled 2021-07-05: qty 2

## 2021-07-05 MED ORDER — HEPARIN (PORCINE) IN NACL 1000-0.9 UT/500ML-% IV SOLN
INTRAVENOUS | Status: DC | PRN
  Administered 2021-07-05 (×2): 500 mL

## 2021-07-05 MED ORDER — SODIUM CHLORIDE 0.9% FLUSH: 3.0000 mL | INTRAVENOUS | Status: DC | PRN

## 2021-07-05 MED ORDER — SODIUM CHLORIDE 0.9% FLUSH: 3.0000 mL | Freq: Two times a day (BID) | INTRAVENOUS | Status: DC

## 2021-07-05 MED ORDER — SODIUM CHLORIDE 0.9 % WEIGHT BASED INFUSION: 1.0000 mL/kg/h | INTRAVENOUS | Status: DC

## 2021-07-05 MED ORDER — MIDAZOLAM HCL 2 MG/2ML IJ SOLN
INTRAMUSCULAR | Status: DC | PRN
  Administered 2021-07-05: 1 mg via INTRAVENOUS
  Administered 2021-07-05: 2 mg via INTRAVENOUS

## 2021-07-05 MED ORDER — LIDOCAINE HCL (PF) 1 % IJ SOLN
INTRAMUSCULAR | Status: AC
  Filled 2021-07-05: qty 30

## 2021-07-05 MED ORDER — ASPIRIN 81 MG PO CHEW: 81.0000 mg | CHEWABLE_TABLET | ORAL | Status: DC

## 2021-07-05 MED ORDER — VERAPAMIL HCL 2.5 MG/ML IV SOLN
INTRAVENOUS | Status: AC
Start: 2021-07-05 — End: ?
  Filled 2021-07-05: qty 2

## 2021-07-05 MED ORDER — FENTANYL CITRATE (PF) 100 MCG/2ML IJ SOLN
INTRAMUSCULAR | Status: AC
  Filled 2021-07-05: qty 2

## 2021-07-05 MED ORDER — VERAPAMIL HCL 2.5 MG/ML IV SOLN
INTRAVENOUS | Status: DC | PRN
  Administered 2021-07-05: 10 mL via INTRA_ARTERIAL

## 2021-07-05 MED ORDER — FENTANYL CITRATE (PF) 100 MCG/2ML IJ SOLN
INTRAMUSCULAR | Status: DC | PRN
  Administered 2021-07-05 (×2): 25 ug via INTRAVENOUS

## 2021-07-05 MED ORDER — SODIUM CHLORIDE 0.9 % WEIGHT BASED INFUSION
3.0000 mL/kg/h | INTRAVENOUS | Status: AC
  Administered 2021-07-05: 3 mL/kg/h via INTRAVENOUS

## 2021-07-05 MED ORDER — SODIUM CHLORIDE 0.9 % IV SOLN: 250.0000 mL | INTRAVENOUS | Status: DC | PRN

## 2021-07-05 MED ORDER — HEPARIN (PORCINE) IN NACL 1000-0.9 UT/500ML-% IV SOLN
INTRAVENOUS | Status: AC
  Filled 2021-07-05: qty 1000

## 2021-07-05 MED ORDER — HEPARIN SODIUM (PORCINE) 1000 UNIT/ML IJ SOLN
INTRAMUSCULAR | Status: AC
  Filled 2021-07-05: qty 10

## 2021-07-05 MED ORDER — IOHEXOL 350 MG/ML SOLN
INTRAVENOUS | Status: DC | PRN
  Administered 2021-07-05: 50 mL

## 2021-07-05 MED ORDER — LIDOCAINE HCL (PF) 1 % IJ SOLN
INTRAMUSCULAR | Status: DC | PRN
  Administered 2021-07-05: 2 mL

## 2021-07-05 MED ORDER — HEPARIN SODIUM (PORCINE) 1000 UNIT/ML IJ SOLN
INTRAMUSCULAR | Status: DC | PRN
  Administered 2021-07-05: 3500 [IU] via INTRAVENOUS

## 2021-07-05 SURGICAL SUPPLY — 12 items
BAND ZEPHYR COMPRESS 30 LONG (HEMOSTASIS) ×1 IMPLANT
CATH INFINITI JR4 5F (CATHETERS) ×1 IMPLANT
CATH OPTITORQUE TIG 4.0 5F (CATHETERS) ×1 IMPLANT
GLIDESHEATH SLEND SS 6F .021 (SHEATH) ×1 IMPLANT
GUIDEWIRE INQWIRE 1.5J.035X260 (WIRE) IMPLANT
INQWIRE 1.5J .035X260CM (WIRE) ×2
KIT HEART LEFT (KITS) ×2 IMPLANT
PACK CARDIAC CATHETERIZATION (CUSTOM PROCEDURE TRAY) ×2 IMPLANT
SHEATH PROBE COVER 6X72 (BAG) ×1 IMPLANT
SYR MEDRAD MARK 7 150ML (SYRINGE) ×2 IMPLANT
TRANSDUCER W/STOPCOCK (MISCELLANEOUS) ×2 IMPLANT
TUBING CIL FLEX 10 FLL-RA (TUBING) ×2 IMPLANT

## 2021-07-05 NOTE — Telephone Encounter (Signed)
Results reviewed with pt as per Dr. Revankar's note.  Pt verbalized understanding and had no additional questions. Routed to PCP.  

## 2021-07-05 NOTE — Interval H&P Note (Signed)
Cath Lab Visit (complete for each Cath Lab visit)  Clinical Evaluation Leading to the Procedure:   ACS: No.  Non-ACS:    Anginal Classification: CCS III  Anti-ischemic medical therapy: Minimal Therapy (1 class of medications)  Non-Invasive Test Results: No non-invasive testing performed  Prior CABG: No previous CABG      History and Physical Interval Note:  07/05/2021 7:44 AM  Penne Lash  has presented today for surgery, with the diagnosis of angina.  The various methods of treatment have been discussed with the patient and family. After consideration of risks, benefits and other options for treatment, the patient has consented to  Procedure(s): LEFT HEART CATH AND CORONARY ANGIOGRAPHY (N/A) as a surgical intervention.  The patient's history has been reviewed, patient examined, no change in status, stable for surgery.  I have reviewed the patient's chart and labs.  Questions were answered to the patient's satisfaction.     Shelva Majestic

## 2021-07-08 DIAGNOSIS — Z713 Dietary counseling and surveillance: Secondary | ICD-10-CM | POA: Diagnosis not present

## 2021-07-16 ENCOUNTER — Ambulatory Visit: Payer: BC Managed Care – PPO | Admitting: Neurology

## 2021-07-18 ENCOUNTER — Encounter: Payer: Self-pay | Admitting: Neurology

## 2021-07-18 ENCOUNTER — Ambulatory Visit: Payer: BC Managed Care – PPO | Admitting: Neurology

## 2021-07-21 ENCOUNTER — Other Ambulatory Visit: Payer: Self-pay | Admitting: Cardiology

## 2021-08-02 ENCOUNTER — Other Ambulatory Visit: Payer: Self-pay

## 2021-08-05 ENCOUNTER — Encounter: Payer: Self-pay | Admitting: Cardiology

## 2021-08-05 ENCOUNTER — Ambulatory Visit (INDEPENDENT_AMBULATORY_CARE_PROVIDER_SITE_OTHER): Payer: BC Managed Care – PPO | Admitting: Cardiology

## 2021-08-05 VITALS — BP 120/78 | HR 64 | Ht 60.0 in | Wt 145.0 lb

## 2021-08-05 DIAGNOSIS — I2510 Atherosclerotic heart disease of native coronary artery without angina pectoris: Secondary | ICD-10-CM

## 2021-08-05 DIAGNOSIS — E782 Mixed hyperlipidemia: Secondary | ICD-10-CM

## 2021-08-05 HISTORY — DX: Atherosclerotic heart disease of native coronary artery without angina pectoris: I25.10

## 2021-08-05 NOTE — Patient Instructions (Signed)
Medication Instructions:  Your physician recommends that you continue on your current medications as directed. Please refer to the Current Medication list given to you today.  *If you need a refill on your cardiac medications before your next appointment, please call your pharmacy*   Lab Work: None If you have labs (blood work) drawn today and your tests are completely normal, you will receive your results only by: [place] (if you have MyChart) OR A paper copy in the mail If you have any lab test that is abnormal or we need to change your treatment, we will call you to review the results.   Testing/Procedures: None   Follow-Up: At [facility], you and your health needs are our priority.  As part of our continuing mission to provide you with exceptional heart care, we have created designated Provider Care Teams.  These Care Teams include your primary Cardiologist (physician) and Advanced Practice Providers (APPs -  Physician Assistants and Nurse Practitioners) who all work together to provide you with the care you need, when you need it.  We recommend signing up for the patient portal called "MyChart".  Sign up information is provided on this After Visit Summary.  MyChart is used to connect with patients for Virtual Visits (Telemedicine).  Patients are able to view lab/test results, encounter notes, upcoming appointments, etc.  Non-urgent messages can be sent to your provider as well.   To learn more about what you can do with MyChart, go to [email or web address].    Your next appointment:   Follow up as needed   The format for your next appointment:   In Person  Provider:   Jyl Heinz, MD    Other Instructions None  Important Information About Sugar

## 2021-08-05 NOTE — Progress Notes (Signed)
Cardiology Office Note:    Date:  08/05/2021   ID:  Jasmine Hahn, DOB October 19, 1960, MRN [id number]  PCP:  Nonnie Done., MD  Cardiologist:  Garwin Brothers, MD   Referring MD: Nonnie Done., MD    ASSESSMENT:    1. Mixed hyperlipidemia   2. Coronary artery disease involving native coronary artery of native heart without angina pectoris    PLAN:    In order of problems listed above:  Mild coronary artery disease: Secondary prevention stressed with the patient.  Importance of compliance with diet medication stressed and she vocalized understanding.  Coronary angiography report given below.  Secondary prevention stressed and I told her to walk at least half an hour a day 5 days a week and she promises to do so. Mixed dyslipidemia: Diet was emphasized and lifestyle modifications stressed.  She will need blood work.  She wants to get established with primary care for blood work and she will need statin therapy also because of established coronary artery disease in the guidelines.  She is agreeable.  She wants to do this with primary care. She will not only be seen in follow-up appointment on a as needed basis.  She understood the above and questions were answered to her satisfaction.   Medication Adjustments/Labs and Tests Ordered: Current medicines are reviewed at length with the patient today.  Concerns regarding medicines are outlined above.  No orders of the defined types were placed in this encounter.  No orders of the defined types were placed in this encounter.    Chief Complaint  Patient presents with   Follow-up     History of Present Illness:    Jasmine Hahn is a 61 y.o. female.  Patient has past medical history of mixed dyslipidemia.  She was evaluated by me for chest pain.  Fortunately she has nonobstructive coronary artery disease.  Coronary angiography details are given below.  She is happy about it.  At the time of my evaluation, the patient is  alert awake oriented and in no distress.  Her palpitations have also resolved.  Past Medical History:  Diagnosis Date   Aftercare following right knee joint replacement surgery 09/01/2017   ALLERGIC RHINITIS, SEASONAL 06/26/2006   Qualifier: Diagnosis of  By: Drue Novel MD, Nolon Rod.    Allergy    Angina pectoris (HCC) 07/03/2021   Arthritis    Atypical chest pain 04/27/2018   Chronic constipation 07/13/2019   COVID-19 02/25/2019   Discoid lupus    Dyspnea on exertion 07/03/2021   FATIGUE 08/23/2007   Qualifier: Diagnosis of  By: Janit Bern     Former tobacco use 07/29/2017   Frontal headache 01/31/2021   GERD 06/26/2006   Qualifier: Diagnosis of  By: Drue Novel MD, Nolon Rod.    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    Palpitations 11/24/2017   Primary localized osteoarthritis of right knee 04/17/2016   SINUSITIS- ACUTE-NOS 12/29/2006   Qualifier: Diagnosis of  By: Drue Novel MD, Nolon Rod.    Syncope  not sure why   Thyroid disease    hypo   URI 08/23/2007   Qualifier: Diagnosis of  By: Janit Bern     Wears partial dentures     Past Surgical History:  Procedure Laterality Date   ABDOMINAL HYSTERECTOMY     bone spurs     feet-both feet   CHOLECYSTECTOMY     COLONOSCOPY     ELBOW SURGERY     Dr Zachary George   ESOPHAGOGASTRODUODENOSCOPY  02/2011   Medoff, NEG   KNEE ARTHROSCOPY WITH DRILLING/MICROFRACTURE Right 05/25/2014   Procedure: KNEE ARTHROSCOPY WITH MICROFRACTURE PATELLA FEMORAL CONDYLE;  Surgeon: Mckinley Jewel, MD;  Location: [facility];  Service:  Orthopedics;  Laterality: Right;   KNEE ARTHROSCOPY WITH MEDIAL MENISECTOMY Right 05/25/2014   Procedure: RIGHT KNEE ARTHROSCOPY WITH PARTIAL MEDIAL;  Surgeon: Mckinley Jewel, MD;  Location: [facility];  Service: Orthopedics;  Laterality: Right;   LEFT HEART CATH AND CORONARY ANGIOGRAPHY N/A 07/05/2021   Procedure: LEFT HEART CATH AND CORONARY ANGIOGRAPHY;  Surgeon: Lennette Bihari, MD;  Location: [facility];  Service: Cardiovascular;  Laterality: N/A;   LYSIS OF ADHESION Right 05/25/2014   Procedure: LYSIS OF ADHESION;  Surgeon: Mckinley Jewel, MD;  Location: [facility];  Service: Orthopedics;  Laterality: Right;   MOHS SURGERY     OOPHORECTOMY     PARTIAL KNEE ARTHROPLASTY Right 04/17/2016   Procedure: RIGHT KNEE UNICOMPARTMENTAL ARTHROPLASTY;  Surgeon: Loreta Ave, MD;  Location: [facility];  Service: Orthopedics;  Laterality: Right;   REPLACEMENT TOTAL KNEE Right    Skin removed on L leg     THYROIDECTOMY     x 2 surgeries - partial first time   TUBAL LIGATION      Current Medications: Current Meds  Medication Sig   Acetylcysteine (NAC 600) 600 MG CAPS Take 600 mg by mouth 2 (two) times daily. Lunch and after supper   Ascorbic Acid (VITAMIN C PO) Take 1 tablet by mouth daily. Liposomal   aspirin EC 81 MG tablet Take 1 tablet (81 mg total) by mouth daily. Swallow whole.   BIOTIN PO Take 10,000 mcg by mouth daily.   bisacodyl (DULCOLAX) 5 MG EC tablet Take 5 mg by mouth daily.   famotidine (PEPCID) 20 MG tablet Take 40 mg by mouth at bedtime.   Flax OIL Take 15 mLs by mouth daily. Flax seed oil/ Mix with Thrive by le-Val shake   fluticasone (FLONASE) 50 MCG/ACT nasal spray Place 1 spray into both nostrils in the morning, at noon, in the evening, and at bedtime.   levothyroxine (SYNTHROID) 125 MCG tablet Take 125 mcg by mouth daily before breakfast.   loratadine (CLARITIN) 10 MG tablet Take 10 mg by mouth daily.   metoprolol  succinate (TOPROL-XL) 25 MG 24 hr tablet TAKE 1 TABLET BY MOUTH ONCE DAILY . APPOINTMENT REQUIRED FOR FUTURE REFILLS   Multiple Vitamins-Minerals (CENTRUM SILVER ADULT 50+ PO) Take 1 tablet by mouth daily.   nitroGLYCERIN (NITROSTAT) 0.4 MG SL tablet Place 0.4 mg under the tongue every 5 (five) minutes as needed for chest pain.   OVER THE COUNTER MEDICATION Take 750 mg by mouth at bedtime. CuraMed   OVER THE COUNTER MEDICATION Take 2 capsules by mouth daily. Thrive by le-Vel / shake mix and patch that is changed daily   OVER THE COUNTER MEDICATION Take 1 capsule by mouth daily. Ashwaghanda   polyethylene glycol powder (GLYCOLAX/MIRALAX) 17 GM/SCOOP powder  Take 17 g by mouth daily. Mix with Thrive   Quercetin 500 MG CAPS Take 500 mg by mouth daily. Plus   valACYclovir (VALTREX) 1000 MG tablet Take 1,000 mg by mouth 2 (two) times daily.   vitamin B-12 (CYANOCOBALAMIN) 1000 MCG tablet Take 1,000 mcg by mouth daily.   VIVELLE-DOT 0.1 MG/24HR patch Place 1 patch onto the skin 2 (two) times a week.    zinc gluconate 50 MG tablet Take 50 mg by mouth at bedtime.   zolpidem (AMBIEN) 10 MG tablet Take 10 mg by mouth at bedtime.      Allergies:   Dog epithelium allergy skin test, Pollen extract, Uncaria tomentosa (cats claw), Prednisone, and Cat hair extract   Social History   Socioeconomic History   Marital status: Single    Spouse name: Not on file   Number of children: Not on file   Years of education: Not on file   Highest education level: Not on file  Occupational History   Not on file  Tobacco Use   Smoking status: Former    Packs/day: 0.50    Years: 30.00    Total pack years: 15.00    Types: Cigarettes    Quit date: 05/21/2011    Years since quitting: 10.2   Smokeless tobacco: Never  Vaping Use   Vaping Use: Never used  Substance and Sexual Activity   Alcohol use: Never   Drug use: Not Currently    Types: Marijuana   Sexual activity: Not on file  Other Topics Concern   Not on  file  Social History Narrative   Not on file   Social Determinants of Health   Financial Resource Strain: Not on file  Food Insecurity: Not on file  Transportation Needs: Not on file  Physical Activity: Not on file  Stress: Not on file  Social Connections: Not on file     Family History: The patient's family history includes COPD in her maternal grandfather; Cancer in her father; Cancer (age of onset: 58) in her brother; Diabetes in her mother; Heart disease in her maternal grandmother and mother; Hyperlipidemia in her mother; Hypertension in her mother; Hypothyroidism in her mother; Stroke in her mother.  ROS:   Please see the history of present illness.    All other systems reviewed and are negative.  EKGs/Labs/Other Studies Reviewed:    The following studies were reviewed today: LEFT HEART CATH AND CORONARY ANGIOGRAPHY   Conclusion      Mid LM to Dist LM lesion is 10% stenosed.   Mid LAD lesion is 20% stenosed.   There is hyperdynamic left ventricular systolic function.   LV end diastolic pressure is mildly elevated.   The left ventricular ejection fraction is greater than 65% by visual estimate.   Very mild nonobstructive CAD with smooth 10% narrowing in the mid left main, and 20% narrowing in the mid LAD after the diagonal vessel.   Normal left circumflex and normal large dominant RCA.   Hyperdynamic LV function with EF greater than 65% without wall motion abnormalities.  LVEDP was 23 mm   RECOMMENDATION: Aspirin 81 mg for mild CAD.  Consider initiation of lipid-lowering therapy.  It is possible that the patient's exertional dyspnea and chest pain may be secondary to possible spasm of the 20% LAD lesion.  Consider initiation of amlodipine as outpatient depending upon blood pressure.   Recent Labs: 07/03/2021: BUN 10; Creatinine, Ser 0.70; Hemoglobin 14.5; Platelets 339; Potassium 4.5; Sodium 140  Recent Lipid  Panel    Component Value Date/Time   CHOL 174  01/25/2014 1722   TRIG 252 (H) 01/25/2014 1722   HDL 40 01/25/2014 1722   CHOLHDL 4.4 01/25/2014 1722   VLDL 50 (H) 01/25/2014 1722   LDLCALC 84 01/25/2014 1722    Physical Exam:    VS:  BP 120/78 (BP Location: Left Arm, Patient Position: Sitting, Cuff Size: Normal)   Pulse 64   Ht 5' (1.524 m)   Wt 145 lb (65.8 kg)   SpO2 98%   BMI 28.32 kg/m     Wt Readings from Last 3 Encounters:  08/05/21 145 lb (65.8 kg)  07/05/21 145 lb (65.8 kg)  07/03/21 145 lb 3.2 oz (65.9 kg)     GEN: Patient is in no acute distress HEENT: Normal NECK: No JVD; No carotid bruits LYMPHATICS: No lymphadenopathy CARDIAC: Hear sounds regular, 2/6 systolic murmur at the apex. RESPIRATORY:  Clear to auscultation without rales, wheezing or rhonchi  ABDOMEN: Soft, non-tender, non-distended MUSCULOSKELETAL:  No edema; No deformity  SKIN: Warm and dry NEUROLOGIC:  Alert and oriented x 3 PSYCHIATRIC:  Normal affect   Signed, Garwin Brothers, MD  08/05/2021 4:20 PM    [facility]

## 2021-08-08 ENCOUNTER — Ambulatory Visit: Payer: BC Managed Care – PPO | Admitting: Diagnostic Neuroimaging

## 2021-08-20 DIAGNOSIS — E559 Vitamin D deficiency, unspecified: Secondary | ICD-10-CM | POA: Diagnosis not present

## 2021-08-20 DIAGNOSIS — Z79899 Other long term (current) drug therapy: Secondary | ICD-10-CM | POA: Diagnosis not present

## 2021-08-20 DIAGNOSIS — I10 Essential (primary) hypertension: Secondary | ICD-10-CM | POA: Diagnosis not present

## 2021-08-20 DIAGNOSIS — K219 Gastro-esophageal reflux disease without esophagitis: Secondary | ICD-10-CM | POA: Diagnosis not present

## 2021-08-20 DIAGNOSIS — Z131 Encounter for screening for diabetes mellitus: Secondary | ICD-10-CM | POA: Diagnosis not present

## 2021-08-20 DIAGNOSIS — E039 Hypothyroidism, unspecified: Secondary | ICD-10-CM | POA: Diagnosis not present

## 2021-08-20 DIAGNOSIS — E785 Hyperlipidemia, unspecified: Secondary | ICD-10-CM | POA: Diagnosis not present

## 2021-09-02 DIAGNOSIS — R1013 Epigastric pain: Secondary | ICD-10-CM | POA: Diagnosis not present

## 2021-09-02 DIAGNOSIS — Z1331 Encounter for screening for depression: Secondary | ICD-10-CM | POA: Diagnosis not present

## 2021-09-02 DIAGNOSIS — I10 Essential (primary) hypertension: Secondary | ICD-10-CM | POA: Diagnosis not present

## 2021-09-02 DIAGNOSIS — M542 Cervicalgia: Secondary | ICD-10-CM | POA: Diagnosis not present

## 2021-09-02 DIAGNOSIS — M549 Dorsalgia, unspecified: Secondary | ICD-10-CM | POA: Diagnosis not present

## 2021-09-02 DIAGNOSIS — E039 Hypothyroidism, unspecified: Secondary | ICD-10-CM | POA: Diagnosis not present

## 2021-09-02 DIAGNOSIS — S299XXA Unspecified injury of thorax, initial encounter: Secondary | ICD-10-CM | POA: Diagnosis not present

## 2021-09-02 DIAGNOSIS — R079 Chest pain, unspecified: Secondary | ICD-10-CM | POA: Diagnosis not present

## 2021-09-02 DIAGNOSIS — R200 Anesthesia of skin: Secondary | ICD-10-CM | POA: Diagnosis not present

## 2021-09-02 DIAGNOSIS — M25519 Pain in unspecified shoulder: Secondary | ICD-10-CM | POA: Diagnosis not present

## 2021-09-02 DIAGNOSIS — S3993XA Unspecified injury of pelvis, initial encounter: Secondary | ICD-10-CM | POA: Diagnosis not present

## 2021-09-02 DIAGNOSIS — R413 Other amnesia: Secondary | ICD-10-CM | POA: Diagnosis not present

## 2021-09-02 DIAGNOSIS — S199XXA Unspecified injury of neck, initial encounter: Secondary | ICD-10-CM | POA: Diagnosis not present

## 2021-09-02 DIAGNOSIS — S3992XA Unspecified injury of lower back, initial encounter: Secondary | ICD-10-CM | POA: Diagnosis not present

## 2021-09-02 DIAGNOSIS — M545 Low back pain, unspecified: Secondary | ICD-10-CM | POA: Diagnosis not present

## 2021-09-02 DIAGNOSIS — S0990XA Unspecified injury of head, initial encounter: Secondary | ICD-10-CM | POA: Diagnosis not present

## 2021-09-02 DIAGNOSIS — K219 Gastro-esophageal reflux disease without esophagitis: Secondary | ICD-10-CM | POA: Diagnosis not present

## 2021-09-02 DIAGNOSIS — S3991XA Unspecified injury of abdomen, initial encounter: Secondary | ICD-10-CM | POA: Diagnosis not present

## 2021-09-13 DIAGNOSIS — M62838 Other muscle spasm: Secondary | ICD-10-CM | POA: Diagnosis not present

## 2021-10-29 DIAGNOSIS — Z0000 Encounter for general adult medical examination without abnormal findings: Secondary | ICD-10-CM | POA: Diagnosis not present

## 2021-11-11 DIAGNOSIS — R200 Anesthesia of skin: Secondary | ICD-10-CM | POA: Diagnosis not present

## 2021-11-11 DIAGNOSIS — R413 Other amnesia: Secondary | ICD-10-CM | POA: Diagnosis not present

## 2021-11-15 DIAGNOSIS — R92333 Mammographic heterogeneous density, bilateral breasts: Secondary | ICD-10-CM | POA: Diagnosis not present

## 2021-11-15 DIAGNOSIS — N644 Mastodynia: Secondary | ICD-10-CM | POA: Diagnosis not present

## 2021-11-19 DIAGNOSIS — R413 Other amnesia: Secondary | ICD-10-CM | POA: Diagnosis not present

## 2021-11-19 DIAGNOSIS — I6782 Cerebral ischemia: Secondary | ICD-10-CM | POA: Diagnosis not present

## 2021-11-20 DIAGNOSIS — R1013 Epigastric pain: Secondary | ICD-10-CM | POA: Diagnosis not present

## 2021-12-02 DIAGNOSIS — Z9049 Acquired absence of other specified parts of digestive tract: Secondary | ICD-10-CM | POA: Diagnosis not present

## 2021-12-02 DIAGNOSIS — R1013 Epigastric pain: Secondary | ICD-10-CM | POA: Diagnosis not present

## 2021-12-04 ENCOUNTER — Encounter: Payer: Self-pay | Admitting: Pulmonary Disease

## 2021-12-04 ENCOUNTER — Ambulatory Visit (INDEPENDENT_AMBULATORY_CARE_PROVIDER_SITE_OTHER): Payer: BC Managed Care – PPO | Admitting: Pulmonary Disease

## 2021-12-04 VITALS — BP 126/68 | HR 82 | Ht 60.0 in | Wt 148.8 lb

## 2021-12-04 DIAGNOSIS — R0609 Other forms of dyspnea: Secondary | ICD-10-CM | POA: Diagnosis not present

## 2021-12-04 MED ORDER — FLUTICASONE-SALMETEROL 500-50 MCG/ACT IN AEPB
1.0000 | INHALATION_SPRAY | Freq: Two times a day (BID) | RESPIRATORY_TRACT | 3 refills | Status: DC
Start: 2021-12-04 — End: 2021-12-19

## 2021-12-04 MED ORDER — FLUTICASONE-SALMETEROL 500-50 MCG/ACT IN AEPB: 1.0000 | INHALATION_SPRAY | Freq: Two times a day (BID) | RESPIRATORY_TRACT | 3 refills | Status: DC

## 2021-12-04 NOTE — Patient Instructions (Addendum)
Nice to meet you  We will get pulmonary function tests (PFTs) in coming weeks to help further evaluate and understand why you may be short of breath  Use a new inhaler, Advair 1 puff twice a day every day.  Rinse your mouth out thoroughly with water and spit after every use.  Please let me know if is too expensive.  Send a message in 2 to 4 weeks and let me know if it is helping or not.  Hopefully this will help improve your symptoms.  You can continue use albuterol 2 puffs every 4-6 hours as needed for shortness of breath or chest tightness.  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  Return to clinic in 3 months or sooner as needed with Dr. Silas Flood

## 2021-12-19 ENCOUNTER — Telehealth: Payer: Self-pay | Admitting: Pulmonary Disease

## 2021-12-19 DIAGNOSIS — E559 Vitamin D deficiency, unspecified: Secondary | ICD-10-CM | POA: Diagnosis not present

## 2021-12-19 DIAGNOSIS — E785 Hyperlipidemia, unspecified: Secondary | ICD-10-CM | POA: Diagnosis not present

## 2021-12-19 DIAGNOSIS — E039 Hypothyroidism, unspecified: Secondary | ICD-10-CM | POA: Diagnosis not present

## 2021-12-19 DIAGNOSIS — I10 Essential (primary) hypertension: Secondary | ICD-10-CM | POA: Diagnosis not present

## 2021-12-19 MED ORDER — BUDESONIDE-FORMOTEROL FUMARATE 160-4.5 MCG/ACT IN AERO: 2.0000 | INHALATION_SPRAY | Freq: Two times a day (BID) | RESPIRATORY_TRACT | 5 refills | Status: DC

## 2021-12-19 NOTE — Telephone Encounter (Signed)
Called and spoke with patient.  Dr. Kavin Leech recommendations given.  Symbicort sent to requested pharmacy. Nothing further at this time.

## 2021-12-19 NOTE — Telephone Encounter (Signed)
Suspect administration error which can be a problem with the DPI. Stop Advair. Please send symbicort 160-4.5 2 puff BID. If PA is needed then that is fine. She has failed DPI due to intolerance.

## 2021-12-19 NOTE — Telephone Encounter (Signed)
Patient stated she missed a call and would like a call back from the nurse.  CB# [phone]

## 2021-12-19 NOTE — Telephone Encounter (Signed)
Spoke with the pt  She states that she was unable to tolerate advair  She states after using this her throat became "raw" despite rinsing mouth thoroughly after use She stopped for a few days, throat felt much better and then worsened again after she tried taking it once more  She states that she will just use albuterol prn for now, ort before planned exertion as discussed at recent ov Please advise if any other recs, thanks!

## 2021-12-28 NOTE — Progress Notes (Signed)
@Patient  ID: Jasmine Hahn, female    DOB: 1960/02/23, 61 y.o.   MRN: [id number]  Chief Complaint  Patient presents with   Consult    Consult for SOB. Pt states Decemeber will make it 2 years since she had covid. And when she had covid that when the SOB started. Pt staes in the last 6 months the SOB has just gotten worse. Chest xray on 06/26/21. Pt states that she does take Albuterol as needed, and it does not really help her SOB.     Referring provider: Lucianne Lei, MD  HPI:   61 y.o. woman whom are seen in evaluation for dyspnea on exertion.  Most recent cardiology note reviewed.  Note from referring provider reviewed.  Needs contracted COVID about 2 years ago.  Note she has been short of breath since then.  Will dyspnea on exertion.  At rest it is okay.  Worse with inclines or stairs.  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  Unable to do similar mood or exertion like she could before.  Feels it is gotten worse over the last 6 months or so.  No time of day when things are better or worse.  No position to make things better or worse no environmental or seasonal factors she can identify to make things better or worse.  Has tried albuterol as needed.  Does not think it helps very much.  No other alleviating or exacerbating factors.  15-pack-year smoking history.  Most recent chest imaging chest x-ray 06/2018.  Admits of symptoms revealed clear lungs bilaterally on my review review and interpretation.  She had a left heart catheterization 06/2021 nonobstructive coronary artery disease but significantly elevated LVEDP of 23.  Most recent echocardiogram 10/2017 reveals diastolic dysfunction grade 1 trivial MR, normal RA size, normal RV size, normal RV function.  No elevation in estimated elevated PASP.   Questionaires / Pulmonary Flowsheets:   ACT:      No data to display          MMRC:     No data to display          Epworth:      No data to  display          Tests:   FENO:  No results found for: "NITRICOXIDE"  PFT:     No data to display          WALK:      No data to display          Imaging: Personally reviewed and as per EMR discussion this note No results found.  Lab Results: Personally reviewed CBC    Component Value Date/Time   WBC 7.2 07/03/2021 1400   WBC 8.3 06/26/2021 1845   RBC 4.75 07/03/2021 1400   RBC 4.65 06/26/2021 1845   HGB 14.5 07/03/2021 1400   HCT 43.1 07/03/2021 1400   PLT 339 07/03/2021 1400   MCV 91 07/03/2021 1400   MCH 30.5 07/03/2021 1400   MCH 31.0 06/26/2021 1845   MCHC 33.6 07/03/2021 1400   MCHC 34.6 06/26/2021 1845   RDW 12.8 07/03/2021 1400   LYMPHSABS 2.2 07/03/2021 1400   MONOABS 0.7 01/03/2021 1555   EOSABS 0.1 07/03/2021 1400   BASOSABS 0.1 07/03/2021 1400    BMET    Component Value Date/Time   NA 140 07/03/2021 1400   K 4.5 07/03/2021 1400   CL 102 07/03/2021 1400   CO2 23 07/03/2021  1400   GLUCOSE 80 07/03/2021 1400   GLUCOSE 86 06/26/2021 1845   BUN 10 07/03/2021 1400   CREATININE 0.70 07/03/2021 1400   CREATININE 0.52 01/25/2014 1722   CALCIUM 9.6 07/03/2021 1400   GFRNONAA 59 (L) 06/26/2021 1845   GFRNONAA >89 01/25/2014 1722   GFRAA >89 01/25/2014 1722    BNP No results found for: "BNP"  ProBNP No results found for: "PROBNP"  Specialty Problems       Pulmonary Problems   ALLERGIC RHINITIS, SEASONAL    Qualifier: Diagnosis of  By: Drue Novel MD, Nolon Rod.       MAXILLARY SINUSITIS    Qualifier: Diagnosis of  By: Drue Novel MD, Nolon Rod       SINUSITIS- ACUTE-NOS    Qualifier: Diagnosis of  By: Drue Novel MD, Nolon Rod       URI    Qualifier: Diagnosis of  By: Janit Bern        Dyspnea on exertion    Allergies  Allergen Reactions   Dog Epithelium Allergy Skin Test Other (See Comments)    Other reaction(s): Eye Swelling, Wheezing   Pollen Extract Other (See Comments) and Tinitus    Other reaction(s): Headache, Other    Uncaria Tomentosa (Cats Claw)     Other reaction(s): eye redness, eye swelling, respiratory distress   Prednisone Other (See Comments)    Causes patient to gain weight    Cat Hair Extract Other (See Comments)    Eye Redness, Eye Swelling, Respiratory Distress     Immunization History  Administered Date(s) Administered   Influenza,inj,Quad PF,6+ Mos 12/12/2017   Influenza-Unspecified 10/30/2013, 11/30/2017, 10/07/2020   Moderna Sars-Covid-2 Vaccination 04/14/2019, 05/12/2019, 01/20/2020   PPD Test 01/25/2014   Tdap 06/08/2012   Unspecified SARS-COV-2 Vaccination 04/21/2019    Past Medical History:  Diagnosis Date   Aftercare following right knee joint replacement surgery 09/01/2017   ALLERGIC RHINITIS, SEASONAL 06/26/2006   Qualifier: Diagnosis of  By: Drue Novel MD, Nolon Rod.    Allergy    Angina pectoris (HCC) 07/03/2021   Arthritis    Atypical chest pain 04/27/2018   Chronic constipation 07/13/2019   COVID-19 02/25/2019   Discoid lupus    Dyspnea on exertion 07/03/2021   FATIGUE 08/23/2007   Qualifier: Diagnosis of  By: Janit Bern     Former tobacco use 07/29/2017   Frontal headache 01/31/2021   GERD 06/26/2006   Qualifier: Diagnosis of  By: Drue Novel MD, Nolon Rod.    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    Palpitations 11/24/2017   Primary localized osteoarthritis of right knee 04/17/2016   SINUSITIS- ACUTE-NOS 12/29/2006   Qualifier: Diagnosis of  By: Drue Novel MD, Nolon Rod.    Syncope    not sure why   Thyroid disease    hypo   URI 08/23/2007   Qualifier:  Diagnosis of  By: Janit Bern     Wears partial dentures     Tobacco History: Social History   Tobacco Use  Smoking Status Former   Packs/day:  0.50   Years: 30.00   Total pack years: 15.00   Types: Cigarettes   Quit date: 05/21/2011   Years since quitting: 10.6  Smokeless Tobacco Never   Counseling given: Not Answered   Continue to not smoke  Outpatient Encounter Medications as of 12/04/2021  Medication Sig   Acetylcysteine (NAC 600) 600 MG CAPS Take 600 mg by mouth 2 (two) times daily. Lunch and after supper   Ascorbic Acid (VITAMIN C PO) Take 1 tablet by mouth daily. Liposomal   aspirin EC 81 MG tablet Take 1 tablet (81 mg total) by mouth daily. Swallow whole.   BIOTIN PO Take 10,000 mcg by mouth daily.   bisacodyl (DULCOLAX) 5 MG EC tablet Take 5 mg by mouth daily.   Flax OIL Take 15 mLs by mouth daily. Flax seed oil/ Mix with Thrive by le-Val shake   fluticasone (FLONASE) 50 MCG/ACT nasal spray Place 1 spray into both nostrils in the morning, at noon, in the evening, and at bedtime.   levothyroxine (SYNTHROID) 125 MCG tablet Take 125 mcg by mouth daily before breakfast.   loratadine (CLARITIN) 10 MG tablet Take 10 mg by mouth daily.   metoprolol succinate (TOPROL-XL) 25 MG 24 hr tablet TAKE 1 TABLET BY MOUTH ONCE DAILY . APPOINTMENT REQUIRED FOR FUTURE REFILLS   Multiple Vitamins-Minerals (CENTRUM SILVER ADULT 50+ PO) Take 1 tablet by mouth daily.   nitroGLYCERIN (NITROSTAT) 0.4 MG SL tablet Place 0.4 mg under the tongue every 5 (five) minutes as needed for chest pain.   OVER THE COUNTER MEDICATION Take 750 mg by mouth at bedtime. CuraMed   OVER THE COUNTER MEDICATION Take 2 capsules by mouth daily. Thrive by le-Vel / shake mix and patch that is changed daily   OVER THE COUNTER MEDICATION Take 1 capsule by mouth daily. Ashwaghanda   polyethylene glycol powder (GLYCOLAX/MIRALAX) 17 GM/SCOOP powder Take 17 g by mouth daily. Mix with Thrive   Quercetin 500 MG CAPS Take 500  mg by mouth daily. Plus   VENTOLIN HFA 108 (90 Base) MCG/ACT inhaler Inhale 1-2 puffs into the lungs every 4 (four) hours as needed.   vitamin B-12 (CYANOCOBALAMIN) 1000 MCG tablet Take 1,000 mcg by mouth daily.   VIVELLE-DOT 0.1 MG/24HR patch Place 1 patch onto the skin 2 (two) times a week.    zinc gluconate 50 MG tablet Take 50 mg by mouth at bedtime.   zolpidem (AMBIEN) 10 MG tablet Take 10 mg by mouth at bedtime.    [DISCONTINUED] fluticasone-salmeterol (ADVAIR DISKUS) 500-50 MCG/ACT AEPB Inhale 1 puff into the lungs in the morning and at bedtime.   [DISCONTINUED] famotidine (PEPCID) 20 MG tablet Take 40 mg by mouth at bedtime.   [DISCONTINUED] fluticasone-salmeterol (ADVAIR DISKUS) 500-50 MCG/ACT AEPB Inhale 1 puff into the lungs in the morning and at bedtime.   [DISCONTINUED] valACYclovir (VALTREX) 1000 MG tablet Take 1,000 mg by mouth 2 (two) times daily.   No facility-administered encounter medications on file as of 12/04/2021.     Review of Systems  Review of Systems   Physical Exam  BP 126/68 (BP Location: Left Arm, Patient Position: Sitting, Cuff Size: Normal)   Pulse 82   Ht 5' (1.524 m)   Wt 148 lb 12.8 oz (67.5 kg)   SpO2 92%   BMI 29.06 kg/m   Wt Readings from Last 5 Encounters:  12/04/21 148 lb 12.8 oz (67.5 kg)  08/05/21 145 lb (65.8 kg)  07/05/21 145 lb (65.8 kg)  07/03/21 145 lb  3.2 oz (65.9 kg)  06/26/21 146 lb (66.2 kg)    BMI Readings from Last 5 Encounters:  12/04/21 29.06 kg/m  08/05/21 28.32 kg/m  07/05/21 28.32 kg/m  07/03/21 28.36 kg/m  06/26/21 28.51 kg/m     Physical Exam General: Sitting in chair, no acute distress Eyes: EOMI, no icterus Neck: Supple, no JVP Pulmonary: Clear, normal work of breathing Cardiovascular warm, no edema Abdomen: Nondistended, bowel sounds present MSK: No synovitis, no joint effusion Neuro: Normal gait, no weakness Psych: Normal mood, full affect   Assessment & Plan:   Dyspnea on exertion:  Present since COVID infection 2 years ago.  Worse over the last 6 months.  High suspicion for asthma.  Possible cardiac contributors.  On prior left heart cath summer 2023 had elevated LVEDP.  This begs the question of significant diastolic dysfunction and worsening diastology with normal increase in blood pressure response to exercise.  Chest imaging clear.  PFTs for further evaluation.  Asthma: Clinical diagnosis based on environmental factors as well as triggers as well as postviral worsening of dyspnea.  Trial high-dose Advair discus.  Albuterol as needed.  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  Assess response.   Return in about 3 months (around 03/06/2022).   Karren Burly, MD 12/28/2021   This appointment required 62 minutes of patient care (this includes precharting, chart review, review of results, face-to-face care, etc.).

## 2022-01-03 DIAGNOSIS — J019 Acute sinusitis, unspecified: Secondary | ICD-10-CM | POA: Diagnosis not present

## 2022-01-12 DIAGNOSIS — R1084 Generalized abdominal pain: Secondary | ICD-10-CM | POA: Diagnosis not present

## 2022-01-12 DIAGNOSIS — K5900 Constipation, unspecified: Secondary | ICD-10-CM | POA: Diagnosis not present

## 2022-01-12 DIAGNOSIS — R109 Unspecified abdominal pain: Secondary | ICD-10-CM | POA: Diagnosis not present

## 2022-01-12 DIAGNOSIS — I700 Atherosclerosis of aorta: Secondary | ICD-10-CM | POA: Diagnosis not present

## 2022-01-13 DIAGNOSIS — R109 Unspecified abdominal pain: Secondary | ICD-10-CM | POA: Diagnosis not present

## 2022-01-13 DIAGNOSIS — I700 Atherosclerosis of aorta: Secondary | ICD-10-CM | POA: Diagnosis not present

## 2022-01-14 DIAGNOSIS — Z20822 Contact with and (suspected) exposure to covid-19: Secondary | ICD-10-CM | POA: Diagnosis not present

## 2022-01-14 DIAGNOSIS — K5730 Diverticulosis of large intestine without perforation or abscess without bleeding: Secondary | ICD-10-CM | POA: Diagnosis not present

## 2022-01-14 DIAGNOSIS — Z9049 Acquired absence of other specified parts of digestive tract: Secondary | ICD-10-CM | POA: Diagnosis not present

## 2022-01-14 DIAGNOSIS — R911 Solitary pulmonary nodule: Secondary | ICD-10-CM | POA: Diagnosis not present

## 2022-01-14 DIAGNOSIS — R55 Syncope and collapse: Secondary | ICD-10-CM | POA: Diagnosis not present

## 2022-01-14 DIAGNOSIS — R1084 Generalized abdominal pain: Secondary | ICD-10-CM | POA: Diagnosis not present

## 2022-01-14 DIAGNOSIS — R509 Fever, unspecified: Secondary | ICD-10-CM | POA: Diagnosis not present

## 2022-01-14 DIAGNOSIS — I700 Atherosclerosis of aorta: Secondary | ICD-10-CM | POA: Diagnosis not present

## 2022-01-14 DIAGNOSIS — R61 Generalized hyperhidrosis: Secondary | ICD-10-CM | POA: Diagnosis not present

## 2022-01-15 DIAGNOSIS — R109 Unspecified abdominal pain: Secondary | ICD-10-CM | POA: Diagnosis not present

## 2022-01-29 ENCOUNTER — Telehealth: Payer: Self-pay | Admitting: Gastroenterology

## 2022-01-29 NOTE — Telephone Encounter (Signed)
Good Afternoon Dr Lyndel Safe  Patient is requesting transfer of care back to you from digestive health. Patient has records for review available in epic. Please review and advise on scheduling.  Thank you

## 2022-02-02 NOTE — Telephone Encounter (Signed)
OK to set her up in APP clinic-first available RG

## 2022-02-07 DIAGNOSIS — L538 Other specified erythematous conditions: Secondary | ICD-10-CM | POA: Diagnosis not present

## 2022-02-07 DIAGNOSIS — L820 Inflamed seborrheic keratosis: Secondary | ICD-10-CM | POA: Diagnosis not present

## 2022-02-07 DIAGNOSIS — R208 Other disturbances of skin sensation: Secondary | ICD-10-CM | POA: Diagnosis not present

## 2022-02-07 DIAGNOSIS — D485 Neoplasm of uncertain behavior of skin: Secondary | ICD-10-CM | POA: Diagnosis not present

## 2022-02-07 DIAGNOSIS — C44722 Squamous cell carcinoma of skin of right lower limb, including hip: Secondary | ICD-10-CM | POA: Diagnosis not present

## 2022-02-07 DIAGNOSIS — L298 Other pruritus: Secondary | ICD-10-CM | POA: Diagnosis not present

## 2022-02-13 ENCOUNTER — Encounter: Payer: Self-pay | Admitting: Gastroenterology

## 2022-02-13 DIAGNOSIS — C44722 Squamous cell carcinoma of skin of right lower limb, including hip: Secondary | ICD-10-CM | POA: Diagnosis not present

## 2022-03-03 DIAGNOSIS — J019 Acute sinusitis, unspecified: Secondary | ICD-10-CM | POA: Diagnosis not present

## 2022-03-17 DIAGNOSIS — M79672 Pain in left foot: Secondary | ICD-10-CM | POA: Diagnosis not present

## 2022-03-17 DIAGNOSIS — L852 Keratosis punctata (palmaris et plantaris): Secondary | ICD-10-CM

## 2022-03-17 DIAGNOSIS — M2040 Other hammer toe(s) (acquired), unspecified foot: Secondary | ICD-10-CM | POA: Insufficient documentation

## 2022-03-17 DIAGNOSIS — M2042 Other hammer toe(s) (acquired), left foot: Secondary | ICD-10-CM | POA: Diagnosis not present

## 2022-03-17 HISTORY — DX: Keratosis punctata (palmaris et plantaris): L85.2

## 2022-03-17 HISTORY — DX: Other hammer toe(s) (acquired), unspecified foot: M20.40

## 2022-03-20 ENCOUNTER — Ambulatory Visit: Payer: BC Managed Care – PPO | Admitting: Gastroenterology

## 2022-03-20 ENCOUNTER — Encounter: Payer: Self-pay | Admitting: Gastroenterology

## 2022-03-20 VITALS — BP 114/80 | HR 84 | Ht 60.0 in | Wt 146.0 lb

## 2022-03-20 DIAGNOSIS — Z860100 Personal history of colon polyps, unspecified: Secondary | ICD-10-CM

## 2022-03-20 DIAGNOSIS — R1013 Epigastric pain: Secondary | ICD-10-CM

## 2022-03-20 DIAGNOSIS — R140 Abdominal distension (gaseous): Secondary | ICD-10-CM

## 2022-03-20 DIAGNOSIS — Z800 Family history of malignant neoplasm of digestive organs: Secondary | ICD-10-CM

## 2022-03-20 DIAGNOSIS — K219 Gastro-esophageal reflux disease without esophagitis: Secondary | ICD-10-CM | POA: Diagnosis not present

## 2022-03-20 DIAGNOSIS — K581 Irritable bowel syndrome with constipation: Secondary | ICD-10-CM | POA: Diagnosis not present

## 2022-03-20 DIAGNOSIS — R1030 Lower abdominal pain, unspecified: Secondary | ICD-10-CM

## 2022-03-20 NOTE — Patient Instructions (Addendum)
_______________________________________________________  If your blood pressure at your visit was 140/90 or greater, please contact your primary care physician to follow up on this.  _______________________________________________________  If you are age 64 or older, your body mass index should be between 23-30. Your Body mass index is 28.51 kg/m. If this is out of the aforementioned range listed, please consider follow up with your Primary Care Provider.  If you are age 81 or younger, your body mass index should be between 19-25. Your Body mass index is 28.51 kg/m. If this is out of the aformentioned range listed, please consider follow up with your Primary Care Provider.   ________________________________________________________  The [place] [facility] providers would like to encourage you to use [facility] to communicate with providers for non-urgent requests or questions.  Due to long hold times on the telephone, sending your provider a message by [facility] may be a faster and more efficient way to get a response.  Please allow 48 business hours for a response.  Please remember that this is for non-urgent requests.  _______________________________________________________  Jasmine Hahn have been scheduled for an endoscopy and colonoscopy. Please follow the written instructions given to you at your visit today. Please pick up your prep supplies at the pharmacy within the next 1-3 days. If you use inhalers (even only as needed), please bring them with you on the day of your procedure.  Please purchase the following medications over the counter and take as directed: Miralax 17g daily Dulcolax 2 tablets daily  Continue omeprazole daily and pepcid at night   Thank you,  Dr. Jackquline Denmark

## 2022-03-20 NOTE — Progress Notes (Unsigned)
Chief Complaint: GI problems  Referring Provider:  Enid Skeens., MD      ASSESSMENT AND PLAN;   #1. GERD/epi pain/nonulcer dyspepsia. S/P lap chole in past. Neg CT AP 12/2021, neg MRCP  #2. IBS-C with bloating/lowe abdo pain (with prev Dx cathretic colon)  #3. H/O polyps 2013, FH CRC (brother at 60)   Plan: -Continue dulolax 2/day with mirlax 17g poQD -EGD/colon with miralax -Continue omeprazole '40mg'$  po QD/pepcid '20mg'$  po QHS   I discussed EGD/Colonoscopy- the indications, risks, alternatives and potential complications including, but not limited to bleeding, infection, reaction to meds, damage to internal organs, cardiac and/or pulmonary problems, and perforation requiring surgery. The possibility that significant findings could be missed was explained. All ? were answered. Pt consents to proceed.  HPI:    Jasmine Hahn is a 62 y.o. female  With asthma, hypothyroidism, anxiety/insomnia  With multiple GI problems, has been seen by multiple gastroenterologists.  Here to establish care since Dr. Earlean Shawl retired.  -C/O longstanding GERD- x  15-54yr With epi pain- stabbing x 6-7 yrs, getting worse lately, exacerbated by eating.  Has nausea but no vomiting.  He denies having any odynophagia or dysphagia.  No jaundice dark urine or pale stools.  She had negative CT, MRCP as below  -Longstanding history of constipation.  Per notes from Dr. 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  She has been on lactulose, senna, tried lately, herbal laxatives,  Attacks of abo pain   Past GI WU:  EGD Dr. MEarlean Shawl7/22/2021: Nl EGD   Colon 08/11/2019 (Dr. MEarlean Shawl. FU adenomatous polyps/FH of CRC -Mild sigmoid diverticulosis -Otherwise normal colonoscopy. -Colon was redundant   Colonoscopy 05/2014 ([facility]) -Colonic polyp s/p polypectomy -Internal hemorrhoids -Bx: SSA  EGD 05/2014: Mild gastritis.  Otherwise normal EGD -Negative small bowel  biopsies for celiac   CT AP 12/2021 1. Diarrheal state with findings suggestive of mild enterocolitis.  Clinical correlation is recommended. No bowel obstruction. Normal  appendix.  2. Focal area of pleural thickening or subpleural nodule in the  superior segment of the left lower lobe. Follow-up with chest CT in  3 months recommended.  3.  Aortic Atherosclerosis (ICD10-I70.0).   MRCP 12/02/2021 No acute findings.  Prior cholecystectomy. No evidence of biliary ductal dilatation or  choledocholithiasis.      FH-brother with CRC at age 64(was a patient of ours)  STransport planner single, 4 children, no alcohol or smoking. Past Medical History:  Diagnosis Date   Adenomatous colon polyp    Aftercare following right knee joint replacement surgery 09/01/2017   ALLERGIC RHINITIS, SEASONAL 06/26/2006   Qualifier: Diagnosis of  By: PLarose KellsMD, JPratt   Allergy    Angina pectoris ([place] 07/03/2021   Arthritis    Asthma    Atypical chest pain 04/27/2018   Cervical cancer ([place]    Chronic constipation 07/13/2019   COVID-19 02/25/2019   Discoid lupus    Diverticulosis    Dyspnea on exertion 07/03/2021   FATIGUE 08/23/2007   Qualifier: Diagnosis of  By: LJerold Coombe    Former tobacco use 07/29/2017   Frontal headache 01/31/2021   GERD 06/26/2006   Qualifier: Diagnosis of  By: PLarose KellsMD, JAlda Berthold    GERD (gastroesophageal reflux disease)    History of gastric ulcer 07/13/2019   History of hiatal hernia    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   Hormone  replacement therapy (HRT) 07/29/2017   Hyperlipidemia    Hypothyroidism    HYPOTHYROIDISM 06/26/2006   Qualifier: Diagnosis of  By: Larose Kells MD, [place]    Hypothyroidism, postsurgical 07/29/2017   Kidney stones    MAXILLARY SINUSITIS 06/26/2006   Qualifier: Diagnosis of  By: Larose Kells MD, [place]    Palpitations 11/24/2017   Peptic ulcer    Primary localized osteoarthritis of right knee 04/17/2016    SINUSITIS- ACUTE-NOS 12/29/2006   Qualifier: Diagnosis of  By: Larose Kells MD, [place]    Syncope    not sure why   Thyroid disease    hypo   URI 08/23/2007   Qualifier: Diagnosis of  By: Jerold Coombe     Wears partial dentures     Past Surgical History:  Procedure Laterality Date   ABDOMINAL HYSTERECTOMY     bone spurs     feet-both feet   CHOLECYSTECTOMY     COLONOSCOPY     ELBOW SURGERY     Dr Chancy Hurter   ESOPHAGOGASTRODUODENOSCOPY  02/2011   Medoff, NEG   KNEE ARTHROSCOPY WITH DRILLING/MICROFRACTURE Right 05/25/2014   Procedure: KNEE ARTHROSCOPY WITH MICROFRACTURE PATELLA FEMORAL CONDYLE;  Surgeon: Kathryne Hitch, MD;  Location: [facility];  Service: Orthopedics;  Laterality: Right;   KNEE ARTHROSCOPY WITH MEDIAL MENISECTOMY Right 05/25/2014   Procedure: RIGHT KNEE ARTHROSCOPY WITH PARTIAL MEDIAL;  Surgeon: Kathryne Hitch, MD;  Location: [facility];  Service: Orthopedics;  Laterality: Right;   LEFT HEART CATH AND CORONARY ANGIOGRAPHY N/A 07/05/2021   Procedure: LEFT HEART CATH AND CORONARY ANGIOGRAPHY;  Surgeon: Troy Sine, MD;  Location: [facility];  Service: Cardiovascular;  Laterality: N/A;   LYSIS OF ADHESION Right 05/25/2014   Procedure: LYSIS OF ADHESION;  Surgeon: Kathryne Hitch, MD;  Location: [facility];  Service: Orthopedics;  Laterality: Right;   MOHS SURGERY     OOPHORECTOMY     PARTIAL KNEE ARTHROPLASTY Right 04/17/2016   Procedure: RIGHT KNEE UNICOMPARTMENTAL ARTHROPLASTY;  Surgeon: Ninetta Lights, MD;  Location: [facility];  Service: Orthopedics;  Laterality: Right;   REPLACEMENT TOTAL KNEE Right    Skin removed on L leg     THYROIDECTOMY     x 2 surgeries - partial first time   TUBAL LIGATION      Family History  Problem Relation Age of Onset   Stroke Mother    Hypertension Mother    Diabetes Mother    Heart disease Mother    Hyperlipidemia Mother    Hypothyroidism Mother    Colon  polyps Mother    Lung cancer Father    Throat cancer Father    Colon cancer Brother 69   Heart disease Maternal Grandmother    COPD Maternal Grandfather    Colon polyps Daughter     Social History   Tobacco Use   Smoking status: Former    Packs/day: 0.50    Years: 30.00    Total pack years: 15.00    Types: Cigarettes    Quit date: 05/21/2011    Years since quitting: 10.8   Smokeless tobacco: Never  Vaping Use   Vaping Use: Never used  Substance Use Topics   Alcohol use: Never   Drug use: Not Currently    Types: Marijuana    Current Outpatient Medications  Medication Sig Dispense Refill   Acetylcysteine (NAC 600) 600 MG CAPS Take 600 mg by mouth 2 (two) times daily. Lunch and  after supper     AMBULATORY NON FORMULARY MEDICATION Liquid Silver Take 1 tablespoon daily     Ascorbic Acid (VITAMIN C PO) Take 1 tablet by mouth daily. Liposomal     aspirin EC 81 MG tablet Take 1 tablet (81 mg total) by mouth daily. Swallow whole. 90 tablet 3   BIOTIN PO Take 10,000 mcg by mouth daily.     bisacodyl (DULCOLAX) 5 MG EC tablet Take 10 mg by mouth at bedtime.     budesonide-formoterol (SYMBICORT) 160-4.5 MCG/ACT inhaler Inhale 2 puffs into the lungs in the morning and at bedtime. 10.2 g 5   famotidine (PEPCID) 20 MG tablet Take 20 mg by mouth at bedtime.     Flax OIL Take 15 mLs by mouth daily. Flax seed oil/ Mix with Thrive by le-Val shake     fluticasone (FLONASE) 50 MCG/ACT nasal spray Place 1 spray into both nostrils in the morning, at noon, in the evening, and at bedtime.     levothyroxine (SYNTHROID) 125 MCG tablet Take 125 mcg by mouth daily before breakfast.     loratadine (CLARITIN) 10 MG tablet Take 10 mg by mouth daily.     metoprolol succinate (TOPROL-XL) 25 MG 24 hr tablet TAKE 1 TABLET BY MOUTH ONCE DAILY . APPOINTMENT REQUIRED FOR FUTURE REFILLS 90 tablet 2   Multiple Vitamins-Minerals (CENTRUM SILVER ADULT 50+ PO) Take 1 tablet by mouth daily.     niacinamide 500 MG  tablet Take 500 mg by mouth 2 (two) times daily with a meal.     nitroGLYCERIN (NITROSTAT) 0.4 MG SL tablet Place 0.4 mg under the tongue every 5 (five) minutes as needed for chest pain.     omeprazole (PRILOSEC) 40 MG capsule Take 40 mg by mouth daily.     OVER THE COUNTER MEDICATION Take 750 mg by mouth at bedtime. CuraMed     OVER THE COUNTER MEDICATION Take 2 capsules by mouth daily. Thrive by le-Vel / shake mix and patch that is changed daily     polyethylene glycol powder (GLYCOLAX/MIRALAX) 17 GM/SCOOP powder Take 17 g by mouth daily. Mix with Thrive     Quercetin 500 MG CAPS Take 500 mg by mouth daily. Plus     rosuvastatin (CRESTOR) 10 MG tablet Take 1 tablet by mouth at bedtime.     valACYclovir (VALTREX) 1000 MG tablet Take 1,000 mg by mouth as needed.     VENTOLIN HFA 108 (90 Base) MCG/ACT inhaler Inhale 1-2 puffs into the lungs every 4 (four) hours as needed.     vitamin B-12 (CYANOCOBALAMIN) 1000 MCG tablet Take 1,000 mcg by mouth daily.     VIVELLE-DOT 0.1 MG/24HR patch Place 1 patch onto the skin 2 (two) times a week.      zinc gluconate 50 MG tablet Take 50 mg by mouth at bedtime.     zolpidem (AMBIEN) 10 MG tablet Take 10 mg by mouth at bedtime.      No current facility-administered medications for this visit.    Allergies  Allergen Reactions   Dog Epithelium Allergy Skin Test Other (See Comments)    Other reaction(s): Eye Swelling, Wheezing   Pollen Extract Other (See Comments) and Tinitus    Other reaction(s): Headache, Other   Uncaria Tomentosa (Cats Claw)     Other reaction(s): eye redness, eye swelling, respiratory distress   Prednisone Other (See Comments)    Causes patient to gain weight    Cat Hair Extract Other (See Comments)  Eye Redness, Eye Swelling, Respiratory Distress     Review of Systems:  Constitutional: Denies fever, chills, diaphoresis, appetite change and fatigue.  HEENT: Denies photophobia, eye pain, redness, hearing loss, ear pain,  congestion, sore throat, rhinorrhea, sneezing, mouth sores, neck pain, neck stiffness and tinnitus.   Respiratory: Denies SOB, DOE, cough, chest tightness,  and wheezing.   Cardiovascular: Denies chest pain, palpitations and leg swelling.  Genitourinary: Denies dysuria, urgency, frequency, hematuria, flank pain and difficulty urinating.  Musculoskeletal: Denies myalgias, back pain, joint swelling, arthralgias and gait problem.  Skin: No rash.  Neurological: Denies dizziness, seizures, syncope, weakness, light-headedness, numbness and headaches.  Hematological: Denies adenopathy. Easy bruising, personal or family bleeding history  Psychiatric/Behavioral: No anxiety or depression     Physical Exam:    BP 114/80 (BP Location: Left Arm, Patient Position: Sitting, Cuff Size: Normal)   Pulse 84   Ht 5' (1.524 m)   Wt 146 lb (66.2 kg)   BMI 28.51 kg/m  Wt Readings from Last 3 Encounters:  03/20/22 146 lb (66.2 kg)  12/04/21 148 lb 12.8 oz (67.5 kg)  08/05/21 145 lb (65.8 kg)   Constitutional:  Well-developed, in no acute distress. Psychiatric: Normal mood and affect. Behavior is normal. HEENT: Pupils normal.  Conjunctivae are normal. No scleral icterus. Neck supple.  Cardiovascular: Normal rate, regular rhythm. No edema Pulmonary/chest: Effort normal and breath sounds normal. No wheezing, rales or rhonchi. Abdominal: Soft, nondistended. Nontender. Bowel sounds active throughout. There are no masses palpable. No hepatomegaly. Rectal: Deferred Neurological: Alert and oriented to person place and time. Skin: Skin is warm and dry. No rashes noted.  Data Reviewed: I have personally reviewed following labs and imaging studies  CBC:    Latest Ref Rng & Units 07/03/2021    2:00 PM 06/26/2021    6:45 PM 01/03/2021    3:55 PM  CBC  WBC 3.4 - 10.8 x10E3/uL 7.2  8.3  7.6   Hemoglobin 11.1 - 15.9 g/dL 14.5  14.4  13.8   Hematocrit 34.0 - 46.6 % 43.1  41.6  40.1   Platelets 150 - 450 x10E3/uL  339  319  366     CMP:    Latest Ref Rng & Units 07/03/2021    2:00 PM 06/26/2021    6:45 PM 01/03/2021    3:55 PM  CMP  Glucose 70 - 99 mg/dL 80  86  90   BUN 8 - 27 mg/dL '10  17  8   '$ Creatinine 0.57 - 1.00 mg/dL 0.70  1.06  0.55   Sodium 134 - 144 mmol/L 140  137  136   Potassium 3.5 - 5.2 mmol/L 4.5  3.9  3.8   Chloride 96 - 106 mmol/L 102  105  103   CO2 20 - 29 mmol/L '23  28  25   '$ Calcium 8.7 - 10.3 mg/dL 9.6  9.7  9.1         Carmell Austria, MD 03/20/2022, 9:35 AM  Cc: Enid Skeens., MD

## 2022-03-25 DIAGNOSIS — Z79899 Other long term (current) drug therapy: Secondary | ICD-10-CM | POA: Diagnosis not present

## 2022-03-25 DIAGNOSIS — E559 Vitamin D deficiency, unspecified: Secondary | ICD-10-CM | POA: Diagnosis not present

## 2022-03-25 DIAGNOSIS — Z87898 Personal history of other specified conditions: Secondary | ICD-10-CM | POA: Diagnosis not present

## 2022-03-25 DIAGNOSIS — J45909 Unspecified asthma, uncomplicated: Secondary | ICD-10-CM | POA: Diagnosis not present

## 2022-03-25 DIAGNOSIS — I10 Essential (primary) hypertension: Secondary | ICD-10-CM | POA: Diagnosis not present

## 2022-03-25 DIAGNOSIS — K5909 Other constipation: Secondary | ICD-10-CM | POA: Diagnosis not present

## 2022-03-25 DIAGNOSIS — E785 Hyperlipidemia, unspecified: Secondary | ICD-10-CM | POA: Diagnosis not present

## 2022-03-25 DIAGNOSIS — Z09 Encounter for follow-up examination after completed treatment for conditions other than malignant neoplasm: Secondary | ICD-10-CM | POA: Diagnosis not present

## 2022-03-28 ENCOUNTER — Telehealth: Payer: Self-pay | Admitting: Gastroenterology

## 2022-03-28 NOTE — Telephone Encounter (Signed)
Inbound call from patient requesting to speak with a nurse , patient said she haven't be able to use the bathroom and is having a severe stomach pain.Please advise

## 2022-03-28 NOTE — Telephone Encounter (Signed)
Pt stated that she has not had a BM in 6 days. Abdomen very distended along with abdominal pain ( Mid Lower Abdomen) No nausea . Pt stated that she has taking MiraLAX daily, 15 dulcolax in the last 5 day, 3 detox cleanser from the drug store last night and only had a very very small liquid BM this AM. Chart reviewed. Pt had history of this in 12/2021 in which pt went to the ED twice. Pt was notified to not eat anything solid, stay on a liquid diet, Miralax purge instructions provided for pt. Pt notified that if the Miralax purge did not bring relief then she should go to the ED for evaluation and treatment: Pt verbalized understanding with all questions answered.

## 2022-03-31 NOTE — Telephone Encounter (Signed)
Pt stated that she took the Miralax purge on Saturday evening and had multiple BM's on Saturday and on Sunday and 1 this Am. Pt stated that she feels a lot better now. Pt stated that she observed 3 times yesterday dark reddish blood mixed in with her stool that concerned her. Pt stated that she had 1 BM this AM. Regular in color.  Please advise:

## 2022-03-31 NOTE — Telephone Encounter (Signed)
Left message for pt to call back for symptom update:

## 2022-03-31 NOTE — Telephone Encounter (Signed)
Patient is returning our call 

## 2022-04-01 NOTE — Telephone Encounter (Signed)
If she still has any blood in the stool, please let us know In that case-check CBC, CMP Proceed with EGD/colon as per last clinic note. RG

## 2022-04-02 NOTE — Telephone Encounter (Signed)
Pt made aware of Dr. Lyndel Safe recommendations: Pt stated that she has not had any more blood in her stool. Pt stated that she needed to reschedule her procedure date due to the fact that she will be just returning from out of town the date that was previous scheduled: Pt was scheduled on 5 /28/2024 at 8:00 AM.  Pt made aware. Prep instructions were sent to pt via my chart. Pt verbalized understanding with all questions answered.

## 2022-04-11 DIAGNOSIS — Z6826 Body mass index (BMI) 26.0-26.9, adult: Secondary | ICD-10-CM | POA: Diagnosis not present

## 2022-04-11 DIAGNOSIS — Z1272 Encounter for screening for malignant neoplasm of vagina: Secondary | ICD-10-CM | POA: Diagnosis not present

## 2022-04-11 DIAGNOSIS — Z01419 Encounter for gynecological examination (general) (routine) without abnormal findings: Secondary | ICD-10-CM | POA: Diagnosis not present

## 2022-05-01 ENCOUNTER — Other Ambulatory Visit: Payer: Self-pay | Admitting: Cardiology

## 2022-05-02 DIAGNOSIS — M79675 Pain in left toe(s): Secondary | ICD-10-CM | POA: Diagnosis not present

## 2022-05-12 ENCOUNTER — Encounter: Payer: BC Managed Care – PPO | Admitting: Gastroenterology

## 2022-05-20 DIAGNOSIS — T8484XA Pain due to internal orthopedic prosthetic devices, implants and grafts, initial encounter: Secondary | ICD-10-CM | POA: Diagnosis not present

## 2022-05-20 DIAGNOSIS — M25561 Pain in right knee: Secondary | ICD-10-CM | POA: Diagnosis not present

## 2022-05-20 DIAGNOSIS — Z96659 Presence of unspecified artificial knee joint: Secondary | ICD-10-CM | POA: Diagnosis not present

## 2022-05-20 DIAGNOSIS — Z96651 Presence of right artificial knee joint: Secondary | ICD-10-CM | POA: Diagnosis not present

## 2022-05-30 ENCOUNTER — Telehealth: Payer: Self-pay

## 2022-05-30 NOTE — Telephone Encounter (Signed)
Called patient to get scheduled for surgical clearance since she hasn't been seen in a while. Patient states she feels fine and doesn't want to make apt to be seen. I have left VM with Lurena Joiner from [facility] advising of this.

## 2022-06-03 ENCOUNTER — Telehealth: Payer: Self-pay

## 2022-06-03 NOTE — Telephone Encounter (Signed)
Left message to call back to set up tele pre op appt.  

## 2022-06-03 NOTE — Telephone Encounter (Signed)
Name: Jasmine Hahn  DOB: 1960/05/13  MRN: [id number]  Primary Cardiologist: None  Chart reviewed as part of pre-operative protocol coverage. Because of Jasmine Hahn's past medical history and time since last visit, she will require a follow-up telephone visit in order to better assess preoperative cardiovascular risk.  Pre-op covering staff: - Please schedule appointment and call patient to inform them. If patient already had an upcoming appointment within acceptable timeframe, please add "pre-op clearance" to the appointment notes so provider is aware. - Please contact requesting surgeon's office via preferred method (i.e, phone, fax) to inform them of need for appointment prior to surgery.  Would prefer aspirin to be continued, however if bleeding risk is too high can hold aspirin 5 days prior to the procedure and restart when medically safe to do so.  Sharlene Dory, PA-C  06/03/2022, 11:23 AM

## 2022-06-03 NOTE — Telephone Encounter (Signed)
   [facility] Pre-operative Risk Assessment    Request for surgical clearance:  What type of surgery is being performed? Left small toe excision exostosis    When is this surgery scheduled? 07/17/2022   What type of clearance is required (medical clearance vs. Pharmacy clearance to hold med vs. Both)? Both  Are there any medications that need to be held prior to surgery and how long?Not specified    Practice name and name of physician performing surgery? [facility]. Marcene Corning at [facility]    What is your office phone number:  [phone]    7.   What is your office fax number: [phone]  8.   Anesthesia type (None, local, MAC, general) ? Choice    Tiburcio Pea Branae Crail 06/03/2022, 8:47 AM  _________________________________________________________________   (provider comments below)

## 2022-06-04 ENCOUNTER — Encounter: Payer: Self-pay | Admitting: Gastroenterology

## 2022-06-04 NOTE — Telephone Encounter (Signed)
Left message on machine for pt to contact the office to schedule tele clearance appt.

## 2022-06-05 ENCOUNTER — Telehealth: Payer: Self-pay | Admitting: *Deleted

## 2022-06-05 NOTE — Telephone Encounter (Signed)
Dr. Bing Matter is primary card per the pt. Pt has been scheduled for tele pre op appt 07/04/22 @ 9 am. Med rec and consent are done.

## 2022-06-05 NOTE — Telephone Encounter (Signed)
Pre op tele appt has been scheduled for 07/04/22 @ 9:00. Med rec and consent are done.

## 2022-06-05 NOTE — Telephone Encounter (Signed)
Dr. Bing Matter is primary card per the pt. Pt has been scheduled for tele pre op appt 07/04/22 @ 9 am. Med rec and consent are done.      Patient Consent for Virtual Visit        Jasmine Hahn has provided verbal consent on 06/05/2022 for a virtual visit (video or telephone).   CONSENT FOR VIRTUAL VISIT FOR:  Jasmine Hahn  By participating in this virtual visit I agree to the following:  I hereby voluntarily request, consent and authorize [facility] and its employed or contracted physicians, physician assistants, nurse practitioners or other licensed health care professionals (the Practitioner), to provide me with telemedicine health care services (the "Services") as deemed necessary by the treating Practitioner. I acknowledge and consent to receive the Services by the Practitioner via telemedicine. I understand that the telemedicine visit will involve communicating with the Practitioner through live audiovisual communication technology and the disclosure of certain medical information by electronic transmission. I acknowledge that I have been given the opportunity to request an in-person assessment or other available alternative prior to the telemedicine visit and am voluntarily participating in the telemedicine visit.  I understand that I have the right to withhold or withdraw my consent to the use of telemedicine in the course of my care at any time, without affecting my right to future care or treatment, and that the Practitioner or I may terminate the telemedicine visit at any time. I understand that I have the right to inspect all information obtained and/or recorded in the course of the telemedicine visit and may receive copies of available information for a reasonable fee.  I understand that some of the potential risks of receiving the Services via telemedicine include:  Delay or interruption in medical evaluation due to technological equipment failure or  disruption; Information transmitted may not be sufficient (e.g. poor resolution of images) to allow for appropriate medical decision making by the Practitioner; and/or  In rare instances, security protocols could fail, causing a breach of personal health information.  Furthermore, I acknowledge that it is my responsibility to provide information about my medical history, conditions and care that is complete and accurate to the best of my ability. I acknowledge that Practitioner's advice, recommendations, and/or decision may be based on factors not within their control, such as incomplete or inaccurate data provided by me or distortions of diagnostic images or specimens that may result from electronic transmissions. I understand that the practice of medicine is not an exact science and that Practitioner makes no warranties or guarantees regarding treatment outcomes. I acknowledge that a copy of this consent can be made available to me via my patient portal [facility] MyChart), or I can request a printed copy by calling the office of [facility].    I understand that my insurance will be billed for this visit.   I have read or had this consent read to me. I understand the contents of this consent, which adequately explains the benefits and risks of the Services being provided via telemedicine.  I have been provided ample opportunity to ask questions regarding this consent and the Services and have had my questions answered to my satisfaction. I give my informed consent for the services to be provided through the use of telemedicine in my medical care

## 2022-06-17 ENCOUNTER — Telehealth: Payer: Self-pay | Admitting: Gastroenterology

## 2022-06-17 ENCOUNTER — Encounter: Payer: BC Managed Care – PPO | Admitting: Gastroenterology

## 2022-06-17 NOTE — Telephone Encounter (Signed)
Good Morning Dr. Chales Abrahams,  I called this patient at 7:15 am today.  And nobody answered the phone so I left a message for them to call us if they were running late or if they needed to rescheduled.    I will NO SHOW this patient  [facility]

## 2022-06-23 DIAGNOSIS — J309 Allergic rhinitis, unspecified: Secondary | ICD-10-CM | POA: Diagnosis not present

## 2022-06-24 DIAGNOSIS — J309 Allergic rhinitis, unspecified: Secondary | ICD-10-CM | POA: Diagnosis not present

## 2022-07-04 ENCOUNTER — Ambulatory Visit: Payer: BC Managed Care – PPO

## 2022-07-09 DIAGNOSIS — J069 Acute upper respiratory infection, unspecified: Secondary | ICD-10-CM | POA: Diagnosis not present

## 2022-07-27 ENCOUNTER — Other Ambulatory Visit: Payer: Self-pay | Admitting: Cardiology

## 2022-07-30 DIAGNOSIS — I10 Essential (primary) hypertension: Secondary | ICD-10-CM | POA: Diagnosis not present

## 2022-07-30 DIAGNOSIS — E785 Hyperlipidemia, unspecified: Secondary | ICD-10-CM | POA: Diagnosis not present

## 2022-07-30 DIAGNOSIS — K219 Gastro-esophageal reflux disease without esophagitis: Secondary | ICD-10-CM | POA: Diagnosis not present

## 2022-07-30 DIAGNOSIS — E039 Hypothyroidism, unspecified: Secondary | ICD-10-CM | POA: Diagnosis not present

## 2022-07-31 ENCOUNTER — Encounter: Payer: Self-pay | Admitting: Cardiology

## 2022-08-01 ENCOUNTER — Ambulatory Visit (AMBULATORY_SURGERY_CENTER): Payer: BC Managed Care – PPO

## 2022-08-01 ENCOUNTER — Telehealth: Payer: Self-pay

## 2022-08-01 VITALS — Ht 60.0 in | Wt 146.0 lb

## 2022-08-01 DIAGNOSIS — R1013 Epigastric pain: Secondary | ICD-10-CM

## 2022-08-01 DIAGNOSIS — R1030 Lower abdominal pain, unspecified: Secondary | ICD-10-CM

## 2022-08-01 DIAGNOSIS — K581 Irritable bowel syndrome with constipation: Secondary | ICD-10-CM

## 2022-08-01 DIAGNOSIS — Z800 Family history of malignant neoplasm of digestive organs: Secondary | ICD-10-CM

## 2022-08-01 DIAGNOSIS — K219 Gastro-esophageal reflux disease without esophagitis: Secondary | ICD-10-CM

## 2022-08-01 MED ORDER — NA SULFATE-K SULFATE-MG SULF 17.5-3.13-1.6 GM/177ML PO SOLN
1.0000 | Freq: Once | ORAL | 0 refills | Status: AC
Start: 2022-08-01 — End: 2022-08-01

## 2022-08-01 NOTE — Telephone Encounter (Signed)
Proceed with colonoscopy as scheduled RG

## 2022-08-01 NOTE — Progress Notes (Signed)
No egg or soy allergy known to patient  No issues known to pt with past sedation with any surgeries or procedures Patient denies ever being told they had issues or difficulty with intubation  No FH of Malignant Hyperthermia Pt is not on diet pills Pt is not on  home 02  Pt is not on blood thinners  Pt denies  with constipation - takes miralax and dulcolax No A fib or A flutter, patient has SVT Have any cardiac testing pending--follow up 7/23- patient will call back after appt. Pt instructed to use [employer] or GoodRx for a price reduction on prep  Can ambulate independently

## 2022-08-01 NOTE — Telephone Encounter (Signed)
Patient had pre-visit today and during the visit patient discussed having blood in her stools for the last 2 weeks. Her colonoscopy is scheduled for 08/15/22. Patient also revealed that her dad had esophageal cancer in his 57's and her brother had colon cancer at 21yrs.

## 2022-08-04 ENCOUNTER — Telehealth: Payer: Self-pay | Admitting: Pulmonary Disease

## 2022-08-04 NOTE — Telephone Encounter (Signed)
Spoke w/ Pt and she confirmed she has been taking her inhalers and has been fine and will call back to make a appt if needed

## 2022-08-05 ENCOUNTER — Encounter: Payer: Self-pay | Admitting: Gastroenterology

## 2022-08-12 ENCOUNTER — Ambulatory Visit: Payer: BC Managed Care – PPO | Attending: Cardiology

## 2022-08-12 ENCOUNTER — Ambulatory Visit: Payer: BC Managed Care – PPO | Attending: Cardiology | Admitting: Cardiology

## 2022-08-12 ENCOUNTER — Encounter: Payer: Self-pay | Admitting: Cardiology

## 2022-08-12 VITALS — BP 110/82 | HR 70 | Ht 64.0 in | Wt 145.0 lb

## 2022-08-12 DIAGNOSIS — R002 Palpitations: Secondary | ICD-10-CM | POA: Diagnosis not present

## 2022-08-12 DIAGNOSIS — E782 Mixed hyperlipidemia: Secondary | ICD-10-CM

## 2022-08-12 DIAGNOSIS — R0789 Other chest pain: Secondary | ICD-10-CM

## 2022-08-12 DIAGNOSIS — R0609 Other forms of dyspnea: Secondary | ICD-10-CM

## 2022-08-12 DIAGNOSIS — R55 Syncope and collapse: Secondary | ICD-10-CM | POA: Diagnosis not present

## 2022-08-12 DIAGNOSIS — I2510 Atherosclerotic heart disease of native coronary artery without angina pectoris: Secondary | ICD-10-CM

## 2022-08-12 NOTE — Patient Instructions (Signed)
Medication Instructions:  Your physician recommends that you continue on your current medications as directed. Please refer to the Current Medication list given to you today.  *If you need a refill on your cardiac medications before your next appointment, please call your pharmacy*   Lab Work: None Ordered If you have labs (blood work) drawn today and your tests are completely normal, you will receive your results only by: MyChart Message (if you have MyChart) OR A paper copy in the mail If you have any lab test that is abnormal or we need to change your treatment, we will call you to review the results.   Testing/Procedures: Your physician has requested that you have an echocardiogram. Echocardiography is a painless test that uses sound waves to create images of your heart. It provides your doctor with information about the size and shape of your heart and how well your heart's chambers and valves are working. This procedure takes approximately one hour. There are no restrictions for this procedure. Please do NOT wear cologne, perfume, aftershave, or lotions (deodorant is allowed). Please arrive 15 minutes prior to your appointment time.    WHY IS MY DOCTOR PRESCRIBING ZIO? The Zio system is proven and trusted by physicians to detect and diagnose irregular heart rhythms -- and has been prescribed to hundreds of thousands of patients.  The FDA has cleared the Zio system to monitor for many different kinds of irregular heart rhythms. In a study, physicians were able to reach a diagnosis 90% of the time with the Zio system1.  You can wear the Zio monitor -- a small, discreet, comfortable patch -- during your normal day-to-day activity, including while you sleep, shower, and exercise, while it records every single heartbeat for analysis.  1Barrett, P., et al. Comparison of 24 Hour Holter Monitoring Versus 14 Day Novel Adhesive Patch Electrocardiographic Monitoring. American Journal of  Medicine, 2014.  ZIO VS. HOLTER MONITORING The Zio monitor can be comfortably worn for up to 14 days. Holter monitors can be worn for 24 to 48 hours, limiting the time to record any irregular heart rhythms you may have. Zio is able to capture data for the 51% of patients who have their first symptom-triggered arrhythmia after 48 hours.1  LIVE WITHOUT RESTRICTIONS The Zio ambulatory cardiac monitor is a small, unobtrusive, and water-resistant patch--you might even forget you're wearing it. The Zio monitor records and stores every beat of your heart, whether you're sleeping, working out, or showering.     Follow-Up: At [facility], you and your health needs are our priority.  As part of our continuing mission to provide you with exceptional heart care, we have created designated Provider Care Teams.  These Care Teams include your primary Cardiologist (physician) and Advanced Practice Providers (APPs -  Physician Assistants and Nurse Practitioners) who all work together to provide you with the care you need, when you need it.  We recommend signing up for the patient portal called "MyChart".  Sign up information is provided on this After Visit Summary.  MyChart is used to connect with patients for Virtual Visits (Telemedicine).  Patients are able to view lab/test results, encounter notes, upcoming appointments, etc.  Non-urgent messages can be sent to your provider as well.   To learn more about what you can do with MyChart, go to [email or web address].com.    Your next appointment:   3 month(s)  The format for your next appointment:   In Person  Provider:   Robert Krasowski, MD      Other Instructions NA  

## 2022-08-12 NOTE — Progress Notes (Signed)
Cardiology Office Note:    Date:  08/12/2022   ID:  Jasmine Hahn, DOB February 23, 1960, MRN [id number]  PCP:  Lucianne Lei, MD  Cardiologist:  Gypsy Balsam, MD    Referring MD: Lucianne Lei, MD   Chief Complaint  Patient presents with   Medical Clearance    Dr. Ardell Isaacs  Endo Colonoscopy  08/15/2022    History of Present Illness:    Jasmine Hahn is a 62 y.o. female with past medical history significant for PVCs, palpitations, atypical chest pain, last year she had cardiac catheterization which showed nonobstructive disease with 10% left main and 20% mid LAD lesion, she has been managed medically since that time.  She was referred back to [place] because of complaint of chest pain.  Chest pain typically happens after she eats about an hour later when she lays down she will get the pain Maalox relieved the pain.  She does have any pain while walking.  She does line dancing however she did notice some decreased ability to do that but still continued dancing and enjoying it.  Sometimes she has to stop because of shortness of breath but no chest pain.  She also reported to have episode of syncope when she developed significant pain she will get sweaty sometimes nauseated and a few times that he completely passed out.  She learned that if she stained a bath and the sensation happen she would not.  When she tried to get up and go to the restroom then she will passed out.    Past Medical History:  Diagnosis Date   Adenomatous colon polyp    Aftercare following right knee joint replacement surgery 09/01/2017   ALLERGIC RHINITIS, SEASONAL 06/26/2006   Qualifier: Diagnosis of  By: Drue Novel MD, Nolon Rod.    Allergy    Angina pectoris (HCC) 07/03/2021   Asthma    Atypical chest pain 04/27/2018   Cervical cancer (HCC)    Chronic constipation 07/13/2019   COVID-19 02/25/2019   Discoid lupus    Diverticulosis    Dyspnea on exertion 07/03/2021   FATIGUE 08/23/2007   Qualifier: Diagnosis of  By:  Janit Bern     Former tobacco use 07/29/2017   Frontal headache 01/31/2021   GERD 06/26/2006   Qualifier: Diagnosis of  By: Drue Novel MD, Nolon Rod.    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    Hypothyroidism, postsurgical 07/29/2017   Kidney stones    MAXILLARY SINUSITIS 06/26/2006   Qualifier: Diagnosis of  By: Drue Novel MD, Nolon Rod.    Memory impairment    Palpitations 11/24/2017   Peptic ulcer    SINUSITIS- ACUTE-NOS 12/29/2006   Qualifier: Diagnosis of  By: Drue Novel MD, Nolon Rod.    Syncope    not sure why   Thyroid disease    hypo   URI 08/23/2007   Qualifier: Diagnosis of  By: Janit Bern     Wears partial dentures     Past Surgical History:  Procedure Laterality Date   ABDOMINAL HYSTERECTOMY     bone spurs     feet-both feet   CHOLECYSTECTOMY     COLONOSCOPY  08/11/2019  Dr Kinnie Scales. No neoplasia. Diverticulosis of colon   COLONOSCOPY  06/02/2014   Colon polyp status post polypectomy. Internal hemorrhoids   ELBOW SURGERY     Dr Zachary George   ESOPHAGOGASTRODUODENOSCOPY  02/2011   Medoff, NEG   ESOPHAGOGASTRODUODENOSCOPY  08/11/2019   Dr Kinnie Scales. Normal EGD examination   ESOPHAGOGASTRODUODENOSCOPY  06/02/2014   Mid gastritis. Otherwise normal EGD   KNEE ARTHROSCOPY WITH DRILLING/MICROFRACTURE Right 05/25/2014   Procedure: KNEE ARTHROSCOPY WITH MICROFRACTURE PATELLA FEMORAL CONDYLE;  Surgeon: Mckinley Jewel, MD;  Location: [facility];  Service: Orthopedics;  Laterality: Right;   KNEE ARTHROSCOPY WITH MEDIAL MENISECTOMY Right 05/25/2014   Procedure: RIGHT KNEE ARTHROSCOPY WITH PARTIAL MEDIAL;  Surgeon: Mckinley Jewel, MD;  Location: [facility];  Service:  Orthopedics;  Laterality: Right;   LEFT HEART CATH AND CORONARY ANGIOGRAPHY N/A 07/05/2021   Procedure: LEFT HEART CATH AND CORONARY ANGIOGRAPHY;  Surgeon: Lennette Bihari, MD;  Location: [facility];  Service: Cardiovascular;  Laterality: N/A;   LYSIS OF ADHESION Right 05/25/2014   Procedure: LYSIS OF ADHESION;  Surgeon: Mckinley Jewel, MD;  Location: [facility];  Service: Orthopedics;  Laterality: Right;   MOHS SURGERY     OOPHORECTOMY     PARTIAL KNEE ARTHROPLASTY Right 04/17/2016   Procedure: RIGHT KNEE UNICOMPARTMENTAL ARTHROPLASTY;  Surgeon: Loreta Ave, MD;  Location: [facility];  Service: Orthopedics;  Laterality: Right;   REPLACEMENT TOTAL KNEE Right    Skin removed on L leg     THYROIDECTOMY     x 2 surgeries - partial first time   TUBAL LIGATION      Current Medications: Current Meds  Medication Sig   AMBULATORY NON FORMULARY MEDICATION Take 1 tablet by mouth daily. Liquid Silver Take 1 tablespoon daily   aspirin EC 81 MG tablet Take 1 tablet (81 mg total) by mouth daily. Swallow whole.   BIOTIN PO Take 10,000 mcg by mouth daily.   famotidine (PEPCID) 20 MG tablet Take 20 mg by mouth at bedtime.   Flax OIL Take 15 mLs by mouth daily. Flax seed oil/ Mix with Thrive by le-Val shake   fluticasone (FLONASE) 50 MCG/ACT nasal spray Place 1 spray into both nostrils in the morning, at noon, in the evening, and at bedtime.   levothyroxine (SYNTHROID) 125 MCG tablet Take 125 mcg by mouth daily before breakfast.   metoprolol succinate (TOPROL-XL) 25 MG 24 hr tablet Take 1 tablet (25 mg total) by mouth daily.   montelukast (SINGULAIR) 10 MG tablet Take 10 mg by mouth at bedtime.   Multiple Vitamins-Minerals (CENTRUM SILVER ADULT 50+ PO) Take 1 tablet by mouth daily.   nitroGLYCERIN (NITROSTAT) 0.4 MG SL tablet Place 0.4 mg under the tongue every 5 (five) minutes as needed for chest pain.   nystatin cream (MYCOSTATIN) Apply 1 Application topically  2 (two) times daily.   polyethylene glycol powder (GLYCOLAX/MIRALAX) 17 GM/SCOOP powder Take 17 g by mouth daily. Mix with Thrive   Quercetin 500 MG CAPS Take 500 mg by mouth daily. Plus   rosuvastatin (CRESTOR) 10 MG tablet Take 1 tablet by mouth at bedtime.   VENTOLIN HFA 108 (90 Base) MCG/ACT inhaler Inhale 1-2 puffs into the lungs every 4 (four) hours as needed for wheezing or shortness of breath.   vitamin B-12 (CYANOCOBALAMIN) 1000 MCG tablet Take 1,000 mcg by mouth daily.   VIVELLE-DOT 0.1 MG/24HR patch Place 1 patch onto the skin 2 (two) times a week.  zinc gluconate 50 MG tablet Take 50 mg by mouth at bedtime.   zolpidem (AMBIEN) 10 MG tablet Take 10 mg by mouth at bedtime.      Allergies:   Dog epithelium (canis lupus familiaris), Pollen extract, Uncaria tomentosa (cats claw), Prednisone, and Cat hair extract   Social History   Socioeconomic History   Marital status: Single    Spouse name: Not on file   Number of children: 4   Years of education: Not on file   Highest education level: Not on file  Occupational History   Occupation: Secondary school teacher  Tobacco Use   Smoking status: Former    Current packs/day: 0.00    Average packs/day: 0.5 packs/day for 30.0 years (15.0 ttl pk-yrs)    Types: Cigarettes    Start date: 05/20/1981    Quit date: 05/21/2011    Years since quitting: 11.2   Smokeless tobacco: Never  Vaping Use   Vaping status: Never Used  Substance and Sexual Activity   Alcohol use: Never   Drug use: Not Currently    Types: Marijuana   Sexual activity: Not on file  Other Topics Concern   Not on file  Social History Narrative   Not on file   Social Determinants of Health   Financial Resource Strain: Not on file  Food Insecurity: Not on file  Transportation Needs: Not on file  Physical Activity: Not on file  Stress: Not on file  Social Connections: Unknown (05/31/2021)   Received from [employer]   Social Network    Social Network: Not on file      Family History: The patient's family history includes COPD in her maternal grandfather; Cancer - Colon in her brother; Colon cancer (age of onset: 59) in her brother; Colon polyps in her daughter and mother; Diabetes in her mother; Esophageal cancer in her brother and father; Heart attack in her mother; Heart disease in her maternal grandmother and mother; Hyperlipidemia in her brother and mother; Hypertension in her brother and mother; Hypothyroidism in her mother; Lung cancer in her brother and father; Stroke in her mother; Throat cancer in her father. There is no history of Rectal cancer or Stomach cancer. ROS:   Please see the history of present illness.    All 14 point review of systems negative except as described per history of present illness  EKGs/Labs/Other Studies Reviewed:    EKG Interpretation Date/Time:  Tuesday August 12 2022 08:09:45 EDT Ventricular Rate:  70 PR Interval:  140 QRS Duration:  82 QT Interval:  374 QTC Calculation: 403 R Axis:   72  Text Interpretation: Normal sinus rhythm Normal ECG When compared with ECG of 26-Jun-2021 18:45, No significant change was found Confirmed by Gypsy Balsam [phone]) on 08/12/2022 8:16:47 AM    Recent Labs: No results found for requested labs within last 365 days.  Recent Lipid Panel    Component Value Date/Time   CHOL 174 01/25/2014 1722   TRIG 252 (H) 01/25/2014 1722   HDL 40 01/25/2014 1722   CHOLHDL 4.4 01/25/2014 1722   VLDL 50 (H) 01/25/2014 1722   LDLCALC 84 01/25/2014 1722    Physical Exam:    VS:  BP 110/82 (BP Location: Left Arm, Patient Position: Sitting)   Pulse 70   Ht 5\' 4"  (1.626 m)   Wt 145 lb (65.8 kg)   SpO2 92%   BMI 24.89 kg/m     Wt Readings from Last 3 Encounters:  08/12/22 145 lb (65.8 kg)  08/01/22 146 lb (66.2 kg)  07/30/22 147 lb (66.7 kg)     GEN:  Well nourished, well developed in no acute distress HEENT: Normal NECK: No JVD; No carotid bruits LYMPHATICS: No  lymphadenopathy CARDIAC: RRR, no murmurs, no rubs, no gallops RESPIRATORY:  Clear to auscultation without rales, wheezing or rhonchi  ABDOMEN: Soft, non-tender, non-distended MUSCULOSKELETAL:  No edema; No deformity  SKIN: Warm and dry LOWER EXTREMITIES: no swelling NEUROLOGIC:  Alert and oriented x 3 PSYCHIATRIC:  Normal affect   ASSESSMENT:    1. Coronary artery disease involving native coronary artery of native heart without angina pectoris   2. Atypical chest pain   3. Vasovagal syncope   4. Mixed hyperlipidemia   5. Palpitations    PLAN:    In order of problems listed above:  Coronary disease only luminal disease and based on cardiac catheterization from last year, she is on guideline directed medical therapy which include antiplatelet therapy which I will continue. Atypical chest pain does not look like cardiac pain on top of that we do have cardiac catheterization from last year which was practically normal showing only 10 to 20% of stenosis.  That does not explain her symptomatology.  She is scheduled to have upper GI as well as colonoscopy at the end of this week which I think is an excellent idea will wait for results of the test.  If this will be still unrevealing we may consider doing coronary CT angio but again she just had cardiac catheterization a year ago which was practically normal. Vasovagal syncope will put monitor on her make sure we do not not deal with any significant arrhythmia however from the description she gave me look very suspicious for vasovagal episodes.  To find out the etiology of her pains and suppress those hopefully she will not have those episodes I asked her to stay well-hydrated ask her also to lay down when she got episodes that she felt like if she past.    Medication Adjustments/Labs and Tests Ordered: Current medicines are reviewed at length with the patient today.  Concerns regarding medicines are outlined above.  Orders Placed This Encounter   Procedures   EKG 12-Lead   Medication changes: No orders of the defined types were placed in this encounter.   Signed, Georgeanna [name], MD, [facility] 08/12/2022 8:33 AM    [facility]

## 2022-08-12 NOTE — Addendum Note (Signed)
Addended by: Baldo Ash D on: 08/12/2022 08:49 AM   Modules accepted: Orders

## 2022-08-15 ENCOUNTER — Ambulatory Visit (AMBULATORY_SURGERY_CENTER): Payer: BC Managed Care – PPO | Admitting: Gastroenterology

## 2022-08-15 ENCOUNTER — Encounter: Payer: Self-pay | Admitting: Gastroenterology

## 2022-08-15 VITALS — BP 113/66 | HR 73 | Temp 97.8°F | Resp 25 | Ht 60.0 in | Wt 146.0 lb

## 2022-08-15 DIAGNOSIS — K3189 Other diseases of stomach and duodenum: Secondary | ICD-10-CM | POA: Diagnosis not present

## 2022-08-15 DIAGNOSIS — Z09 Encounter for follow-up examination after completed treatment for conditions other than malignant neoplasm: Secondary | ICD-10-CM | POA: Diagnosis not present

## 2022-08-15 DIAGNOSIS — K229 Disease of esophagus, unspecified: Secondary | ICD-10-CM | POA: Diagnosis not present

## 2022-08-15 DIAGNOSIS — R1013 Epigastric pain: Secondary | ICD-10-CM | POA: Diagnosis not present

## 2022-08-15 DIAGNOSIS — K219 Gastro-esophageal reflux disease without esophagitis: Secondary | ICD-10-CM

## 2022-08-15 DIAGNOSIS — K2950 Unspecified chronic gastritis without bleeding: Secondary | ICD-10-CM

## 2022-08-15 DIAGNOSIS — R1030 Lower abdominal pain, unspecified: Secondary | ICD-10-CM

## 2022-08-15 DIAGNOSIS — K581 Irritable bowel syndrome with constipation: Secondary | ICD-10-CM | POA: Diagnosis not present

## 2022-08-15 DIAGNOSIS — D123 Benign neoplasm of transverse colon: Secondary | ICD-10-CM | POA: Diagnosis not present

## 2022-08-15 DIAGNOSIS — Z800 Family history of malignant neoplasm of digestive organs: Secondary | ICD-10-CM | POA: Diagnosis not present

## 2022-08-15 DIAGNOSIS — R140 Abdominal distension (gaseous): Secondary | ICD-10-CM

## 2022-08-15 DIAGNOSIS — Z86010 Personal history of colonic polyps: Secondary | ICD-10-CM | POA: Diagnosis not present

## 2022-08-15 MED ORDER — PANTOPRAZOLE SODIUM 40 MG PO TBEC: 40.0000 mg | DELAYED_RELEASE_TABLET | Freq: Every day | ORAL | 4 refills | Status: DC

## 2022-08-15 MED ORDER — SODIUM CHLORIDE 0.9 % IV SOLN: 500.0000 mL | Freq: Once | INTRAVENOUS | Status: DC

## 2022-08-15 NOTE — Progress Notes (Unsigned)
Chief Complaint: GI problems  Referring Provider:  Nonnie Done., MD      ASSESSMENT AND PLAN;   #1. GERD/epi pain/nonulcer dyspepsia. S/P lap chole in past. Neg CT AP 12/2021, neg MRCP  #2. IBS-C with bloating/lowe abdo pain (with prev Dx cathretic colon)  #3. H/O polyps 2013, FH CRC (brother at 71)   Plan: -Continue dulolax 2/day with mirlax 17g poQD -EGD/colon with miralax -Continue omeprazole 40mg  po QD/pepcid 20mg  po QHS   I discussed EGD/Colonoscopy- the indications, risks, alternatives and potential complications including, but not limited to bleeding, infection, reaction to meds, damage to internal organs, cardiac and/or pulmonary problems, and perforation requiring surgery. The possibility that significant findings could be missed was explained. All ? were answered. Pt consents to proceed.  HPI:    Jasmine Hahn is a 62 y.o. female  With asthma, hypothyroidism, anxiety/insomnia  With multiple GI problems, has been seen by multiple gastroenterologists.  Here to establish care since Dr. Kinnie Scales retired.  -C/O longstanding GERD- x  15-12yrs With epi pain- stabbing x 6-7 yrs, getting worse lately, exacerbated by eating.  Has nausea but no vomiting.  He denies having any odynophagia or dysphagia.  No jaundice dark urine or pale stools.  She had negative CT, MRCP as below  -Longstanding history of constipation.  Per notes from Dr. 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  She has been on lactulose, senna, tried lately, herbal laxatives,  Attacks of abo pain   Past GI WU:  EGD Dr. Kinnie Scales 08/11/2019: Nl EGD   Colon 08/11/2019 (Dr. Kinnie Scales). FU adenomatous polyps/FH of CRC -Mild sigmoid diverticulosis -Otherwise normal colonoscopy. -Colon was redundant   Colonoscopy 05/2014 ([facility]) -Colonic polyp s/p polypectomy -Internal hemorrhoids -Bx: SSA  EGD 05/2014: Mild gastritis.  Otherwise normal EGD -Negative small bowel  biopsies for celiac   CT AP 12/2021 1. Diarrheal state with findings suggestive of mild enterocolitis.  Clinical correlation is recommended. No bowel obstruction. Normal  appendix.  2. Focal area of pleural thickening or subpleural nodule in the  superior segment of the left lower lobe. Follow-up with chest CT in  3 months recommended.  3.  Aortic Atherosclerosis (ICD10-I70.0).   MRCP 12/02/2021 No acute findings.  Prior cholecystectomy. No evidence of biliary ductal dilatation or  choledocholithiasis.      FH-brother with CRC at age 43 (was a patient of ours)  Education administrator, single, 4 children, no alcohol or smoking. Past Medical History:  Diagnosis Date   Adenomatous colon polyp    Aftercare following right knee joint replacement surgery 09/01/2017   ALLERGIC RHINITIS, SEASONAL 06/26/2006   Qualifier: Diagnosis of  By: Drue Novel MD, Nolon Rod.    Allergy    Angina pectoris (HCC) 07/03/2021   Asthma    Atypical chest pain 04/27/2018   Cervical cancer (HCC)    Chronic constipation 07/13/2019   COVID-19 02/25/2019   Discoid lupus    Diverticulosis    Dyspnea on exertion 07/03/2021   FATIGUE 08/23/2007   Qualifier: Diagnosis of  By: Janit Bern     Former tobacco use 07/29/2017   Frontal headache 01/31/2021   GERD 06/26/2006   Qualifier: Diagnosis of  By: Drue Novel MD, Nolon Rod.    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    Hypothyroidism, postsurgical 07/29/2017   Kidney stones    MAXILLARY SINUSITIS 06/26/2006   Qualifier: Diagnosis of  By: Drue Novel MD, Nolon Rod.    Memory impairment    Palpitations 11/24/2017   Peptic ulcer    SINUSITIS- ACUTE-NOS 12/29/2006   Qualifier:  Diagnosis of  By: Drue Novel MD, Nolon Rod.    Syncope    not sure why   Thyroid disease    hypo   URI 08/23/2007   Qualifier: Diagnosis of  By: Janit Bern     Wears partial dentures     Past Surgical History:  Procedure Laterality Date   ABDOMINAL HYSTERECTOMY     bone spurs     feet-both feet   CHOLECYSTECTOMY     COLONOSCOPY  08/11/2019   Dr Kinnie Scales. No neoplasia. Diverticulosis of colon   COLONOSCOPY  06/02/2014   Colon polyp status post polypectomy. Internal hemorrhoids   ELBOW SURGERY     Dr Zachary George   ESOPHAGOGASTRODUODENOSCOPY  02/2011   Medoff, NEG   ESOPHAGOGASTRODUODENOSCOPY  08/11/2019   Dr Kinnie Scales. Normal EGD examination   ESOPHAGOGASTRODUODENOSCOPY  06/02/2014   Mid gastritis. Otherwise normal EGD   KNEE ARTHROSCOPY WITH DRILLING/MICROFRACTURE Right 05/25/2014   Procedure: KNEE ARTHROSCOPY WITH MICROFRACTURE PATELLA FEMORAL CONDYLE;  Surgeon: Mckinley Jewel, MD;  Location: [facility];  Service: Orthopedics;  Laterality: Right;   KNEE ARTHROSCOPY WITH MEDIAL MENISECTOMY Right 05/25/2014   Procedure: RIGHT KNEE ARTHROSCOPY WITH PARTIAL MEDIAL;  Surgeon: Mckinley Jewel, MD;  Location: [facility];  Service: Orthopedics;  Laterality: Right;   LEFT HEART CATH AND CORONARY ANGIOGRAPHY N/A 07/05/2021   Procedure: LEFT HEART CATH AND CORONARY ANGIOGRAPHY;  Surgeon: Lennette Bihari, MD;  Location: [facility];  Service: Cardiovascular;  Laterality: N/A;   LYSIS OF ADHESION Right 05/25/2014   Procedure: LYSIS OF ADHESION;  Surgeon: Mckinley Jewel, MD;  Location: [facility];  Service: Orthopedics;  Laterality: Right;   MOHS SURGERY     OOPHORECTOMY     PARTIAL KNEE ARTHROPLASTY Right 04/17/2016   Procedure: RIGHT KNEE UNICOMPARTMENTAL ARTHROPLASTY;  Surgeon: Loreta Ave, MD;  Location: [facility];  Service: Orthopedics;  Laterality: Right;   REPLACEMENT TOTAL KNEE Right    Skin removed on L leg     THYROIDECTOMY      x 2 surgeries - partial first time   TUBAL LIGATION      Family History  Problem Relation Age of Onset   Stroke Mother    Hypertension Mother    Diabetes Mother    Heart disease Mother    Hyperlipidemia Mother    Hypothyroidism Mother    Colon polyps Mother    Heart attack Mother    Esophageal cancer Father    Lung cancer Father    Throat cancer Father    Esophageal cancer Brother    Colon cancer Brother 62   Hypertension Brother    Hyperlipidemia Brother    Cancer - Colon Brother    Lung cancer Brother    Heart disease Maternal Grandmother    COPD Maternal Grandfather    Colon polyps Daughter    Rectal cancer Neg Hx    Stomach cancer Neg Hx     Social History   Tobacco Use   Smoking status: Former    Current packs/day: 0.00  Average packs/day: 0.5 packs/day for 30.0 years (15.0 ttl pk-yrs)    Types: Cigarettes    Start date: 05/20/1981    Quit date: 05/21/2011    Years since quitting: 11.2   Smokeless tobacco: Never  Vaping Use   Vaping status: Never Used  Substance Use Topics   Alcohol use: Never   Drug use: Not Currently    Types: Marijuana    Current Outpatient Medications  Medication Sig Dispense Refill   AMBULATORY NON FORMULARY MEDICATION Take 1 tablet by mouth daily. Liquid Silver Take 1 tablespoon daily     aspirin EC 81 MG tablet Take 1 tablet (81 mg total) by mouth daily. Swallow whole. 90 tablet 3   BIOTIN PO Take 10,000 mcg by mouth daily.     famotidine (PEPCID) 20 MG tablet Take 20 mg by mouth at bedtime.     Flax OIL Take 15 mLs by mouth daily. Flax seed oil/ Mix with Thrive by le-Val shake     fluticasone (FLONASE) 50 MCG/ACT nasal spray Place 1 spray into both nostrils in the morning, at noon, in the evening, and at bedtime.     levothyroxine (SYNTHROID) 125 MCG tablet Take 125 mcg by mouth daily before breakfast.     metoprolol succinate (TOPROL-XL) 25 MG 24 hr tablet Take 1 tablet (25 mg total) by mouth daily. 30 tablet 0   Multiple  Vitamins-Minerals (CENTRUM SILVER ADULT 50+ PO) Take 1 tablet by mouth daily.     polyethylene glycol powder (GLYCOLAX/MIRALAX) 17 GM/SCOOP powder Take 17 g by mouth daily. Mix with Thrive     Quercetin 500 MG CAPS Take 500 mg by mouth daily. Plus     rosuvastatin (CRESTOR) 10 MG tablet Take 1 tablet by mouth at bedtime.     vitamin B-12 (CYANOCOBALAMIN) 1000 MCG tablet Take 1,000 mcg by mouth daily.     VIVELLE-DOT 0.1 MG/24HR patch Place 1 patch onto the skin 2 (two) times a week.      zinc gluconate 50 MG tablet Take 50 mg by mouth at bedtime.     zolpidem (AMBIEN) 10 MG tablet Take 10 mg by mouth at bedtime.      montelukast (SINGULAIR) 10 MG tablet Take 10 mg by mouth at bedtime. (Patient not taking: Reported on 08/15/2022)     nitroGLYCERIN (NITROSTAT) 0.4 MG SL tablet Place 0.4 mg under the tongue every 5 (five) minutes as needed for chest pain. (Patient not taking: Reported on 08/15/2022)     nystatin cream (MYCOSTATIN) Apply 1 Application topically 2 (two) times daily. (Patient not taking: Reported on 08/15/2022)     VENTOLIN HFA 108 (90 Base) MCG/ACT inhaler Inhale 1-2 puffs into the lungs every 4 (four) hours as needed for wheezing or shortness of breath. (Patient not taking: Reported on 08/15/2022)     Current Facility-Administered Medications  Medication Dose Route Frequency Provider Last Rate Last Admin   0.9 %  sodium chloride infusion  500 mL Intravenous Once Lynann Bologna, MD        Allergies  Allergen Reactions   Dog Epithelium (Canis Lupus Familiaris) Other (See Comments)    Other reaction(s): Eye Swelling, Wheezing   Pollen Extract Other (See Comments) and Tinitus    Other reaction(s): Headache, Other   Uncaria Tomentosa (Cats Claw)     Other reaction(s): eye redness, eye swelling, respiratory distress   Prednisone Other (See Comments)    Causes patient to gain weight    Cat Hair Extract Other (See Comments)  Eye Redness, Eye Swelling, Respiratory Distress      Review of Systems:  Constitutional: Denies fever, chills, diaphoresis, appetite change and fatigue.  HEENT: Denies photophobia, eye pain, redness, hearing loss, ear pain, congestion, sore throat, rhinorrhea, sneezing, mouth sores, neck pain, neck stiffness and tinnitus.   Respiratory: Denies SOB, DOE, cough, chest tightness,  and wheezing.   Cardiovascular: Denies chest pain, palpitations and leg swelling.  Genitourinary: Denies dysuria, urgency, frequency, hematuria, flank pain and difficulty urinating.  Musculoskeletal: Denies myalgias, back pain, joint swelling, arthralgias and gait problem.  Skin: No rash.  Neurological: Denies dizziness, seizures, syncope, weakness, light-headedness, numbness and headaches.  Hematological: Denies adenopathy. Easy bruising, personal or family bleeding history  Psychiatric/Behavioral: No anxiety or depression     Physical Exam:    BP 136/80   Pulse 77   Temp 97.8 F (36.6 C) (Skin)   Ht 5' (1.524 m)   Wt 146 lb (66.2 kg)   SpO2 98%   BMI 28.51 kg/m  Wt Readings from Last 3 Encounters:  08/15/22 146 lb (66.2 kg)  08/12/22 145 lb (65.8 kg)  08/01/22 146 lb (66.2 kg)   Constitutional:  Well-developed, in no acute distress. Psychiatric: Normal mood and affect. Behavior is normal. HEENT: Pupils normal.  Conjunctivae are normal. No scleral icterus. Neck supple.  Cardiovascular: Normal rate, regular rhythm. No edema Pulmonary/chest: Effort normal and breath sounds normal. No wheezing, rales or rhonchi. Abdominal: Soft, nondistended. Nontender. Bowel sounds active throughout. There are no masses palpable. No hepatomegaly. Rectal: Deferred Neurological: Alert and oriented to person place and time. Skin: Skin is warm and dry. No rashes noted.  Data Reviewed: I have personally reviewed following labs and imaging studies  CBC:    Latest Ref Rng & Units 07/03/2021    2:00 PM 06/26/2021    6:45 PM 01/03/2021    3:55 PM  CBC  WBC 3.4 - 10.8  x10E3/uL 7.2  8.3  7.6   Hemoglobin 11.1 - 15.9 g/dL 16.1  09.6  04.5   Hematocrit 34.0 - 46.6 % 43.1  41.6  40.1   Platelets 150 - 450 x10E3/uL 339  319  366     CMP:    Latest Ref Rng & Units 07/03/2021    2:00 PM 06/26/2021    6:45 PM 01/03/2021    3:55 PM  CMP  Glucose 70 - 99 mg/dL 80  86  90   BUN 8 - 27 mg/dL 10  17  8    Creatinine 0.57 - 1.00 mg/dL 4.09  8.11  9.14   Sodium 134 - 144 mmol/L 140  137  136   Potassium 3.5 - 5.2 mmol/L 4.5  3.9  3.8   Chloride 96 - 106 mmol/L 102  105  103   CO2 20 - 29 mmol/L 23  28  25    Calcium 8.7 - 10.3 mg/dL 9.6  9.7  9.1         Edman Circle, MD 08/15/2022, 8:22 AM  Cc: Nonnie Done., MD

## 2022-08-15 NOTE — Progress Notes (Unsigned)
Called to room to assist during endoscopic procedure.  Patient ID and intended procedure confirmed with present staff. Received instructions for my participation in the procedure from the performing physician.  

## 2022-08-15 NOTE — Progress Notes (Signed)
Pt's states no medical or surgical changes since previsit or office visit. VS assessed by K.P ?

## 2022-08-15 NOTE — Patient Instructions (Signed)
Continue previous diet and medications including Miralax/Dulcolax - avoid fatty foods and soft drinks   Change Omeprazole to Protonix 40 mg once daily - sent to [facility]  Proceed with gastric emptying scan    Await pathology results on on biopsies done   Handout on colon polyps,diverticulosis ,& hemorrhoids  given to you today  Await pathology results on Colon polyp removed     YOU HAD AN ENDOSCOPIC PROCEDURE TODAY AT THE [facility]:   Refer to the procedure report that was given to you for any specific questions about what was found during the examination.  If the procedure report does not answer your questions, please call your gastroenterologist to clarify.  If you requested that your care partner not be given the details of your procedure findings, then the procedure report has been included in a sealed envelope for you to review at your convenience later.  YOU SHOULD EXPECT: Some feelings of bloating in the abdomen. Passage of more gas than usual.  Walking can help get rid of the air that was put into your GI tract during the procedure and reduce the bloating. If you had a lower endoscopy (such as a colonoscopy or flexible sigmoidoscopy) you may notice spotting of blood in your stool or on the toilet paper. If you underwent a bowel prep for your procedure, you may not have a normal bowel movement for a few days.  Please Note:  You might notice some irritation and congestion in your nose or some drainage.  This is from the oxygen used during your procedure.  There is no need for concern and it should clear up in a day or so.  SYMPTOMS TO REPORT IMMEDIATELY:  Following lower endoscopy (colonoscopy or flexible sigmoidoscopy):  Excessive amounts of blood in the stool  Significant tenderness or worsening of abdominal pains  Swelling of the abdomen that is new, acute  Fever of 100F or higher  Following upper endoscopy (EGD)  Vomiting of blood or coffee ground  material  New chest pain or pain under the shoulder blades  Painful or persistently difficult swallowing  New shortness of breath  Fever of 100F or higher  Black, tarry-looking stools  For urgent or emergent issues, a gastroenterologist can be reached at any hour by calling [phone]. Do not use MyChart messaging for urgent concerns.    DIET:  We do recommend a small meal at first, but then you may proceed to your regular diet.  Drink plenty of fluids but you should avoid alcoholic beverages for 24 hours.  ACTIVITY:  You should plan to take it easy for the rest of today and you should NOT DRIVE or use heavy machinery until tomorrow (because of the sedation medicines used during the test).    FOLLOW UP: Our staff will call the number listed on your records the next business day following your procedure.  We will call around 7:15- 8:00 am to check on you and address any questions or concerns that you may have regarding the information given to you following your procedure. If we do not reach you, we will leave a message.     If any biopsies were taken you will be contacted by phone or by letter within the next 1-3 weeks.  Please call us at [phone] if you have not heard about the biopsies in 3 weeks.    SIGNATURES/CONFIDENTIALITY: You and/or your care partner have signed paperwork which will be entered into your electronic medical record.  These signatures attest to the fact that that the information above on your After Visit Summary has been reviewed and is understood.  Full responsibility of the confidentiality of this discharge information lies with you and/or your care-partner.

## 2022-08-15 NOTE — Op Note (Signed)
[facility] Patient Name: Jasmine Hahn Procedure Date: 08/15/2022 8:28 AM MRN: [id number] Endoscopist: Lynann Bologna , MD, [id number] Age: 62 Referring MD:  Date of Birth: 1960/08/18 Gender: Female Account #: [id number] Procedure:                Upper GI endoscopy Indications:              Epigastric abdominal pain with GERD. Medicines:                Monitored Anesthesia Care Procedure:                Pre-Anesthesia Assessment:                           - Prior to the procedure, a History and Physical                            was performed, and patient medications and                            allergies were reviewed. The patient's tolerance of                            previous anesthesia was also reviewed. The risks                            and benefits of the procedure and the sedation                            options and risks were discussed with the patient.                            All questions were answered, and informed consent                            was obtained. Prior Anticoagulants: The patient has                            taken no anticoagulant or antiplatelet agents. ASA                            Grade Assessment: II - A patient with mild systemic                            disease. After reviewing the risks and benefits,                            the patient was deemed in satisfactory condition to                            undergo the procedure.                           After obtaining informed consent, the endoscope was  passed under direct vision. Throughout the                            procedure, the patient's blood pressure, pulse, and                            oxygen saturations were monitored continuously. The                            Olympus Scope [id number] was introduced through the                            mouth, and advanced to the second part of duodenum.                            The upper GI  endoscopy was accomplished without                            difficulty. The patient tolerated the procedure                            well. Scope In: Scope Out: Findings:                 The examined esophagus was normal. Multiple                            biopsies were obtained from proximal, mid and                            distal esophagus.                           The Z-line was regular and was found 38 cm from the                            incisors.                           The entire examined stomach was normal. Biopsies                            were taken with a cold forceps for histology.                           The examined duodenum was normal. Biopsies for                            histology were taken with a cold forceps for                            evaluation of celiac disease. Complications:            No immediate complications. Estimated Blood Loss:     Estimated blood loss: none. Impression:               -  Normal EGD Recommendation:           - Patient has a contact number available for                            emergencies. The signs and symptoms of potential                            delayed complications were discussed with the                            patient. Return to normal activities tomorrow.                            Written discharge instructions were provided to the                            patient.                           - Resume previous diet.                           - Continue present medications.                           - Change omeprazole to Protonix 40 mg p.o. QD #90,                            4RF                           - Proceed with a solid-phase gastric emptying scan                           - Await pathology results.                           - If still with problems, further workup.                           - The findings and recommendations were discussed                            with the patient's  family. Lynann Bologna, MD 08/15/2022 9:05:14 AM This report has been signed electronically.

## 2022-08-15 NOTE — Progress Notes (Unsigned)
Uneventful anesthetic. Report to pacu rn. Vss. Care resumed by rn. 

## 2022-08-15 NOTE — Op Note (Signed)
[facility] Patient Name: Jasmine Hahn Procedure Date: 08/15/2022 8:25 AM MRN: [id number] Endoscopist: Lynann Bologna , MD, [id number] Age: 62 Referring MD:  Date of Birth: [birth year] Gender: Female Account #: [id number] Procedure:                Colonoscopy Indications:              1. H/O polyps 2013. 2. FH CRC (brother at 80). 3.                            Chronic constipation Medicines:                Monitored Anesthesia Care Procedure:                Pre-Anesthesia Assessment:                           - Prior to the procedure, a History and Physical                            was performed, and patient medications and                            allergies were reviewed. The patient's tolerance of                            previous anesthesia was also reviewed. The risks                            and benefits of the procedure and the sedation                            options and risks were discussed with the patient.                            All questions were answered, and informed consent                            was obtained. Prior Anticoagulants: The patient has                            taken no anticoagulant or antiplatelet agents. ASA                            Grade Assessment: II - A patient with mild systemic                            disease. After reviewing the risks and benefits,                            the patient was deemed in satisfactory condition to                            undergo the procedure.  After obtaining informed consent, the colonoscope                            was passed under direct vision. Throughout the                            procedure, the patient's blood pressure, pulse, and                            oxygen saturations were monitored continuously. The                            Olympus Scope SN: [id number] was introduced through                            the anus and advanced to the the cecum,  identified                            by appendiceal orifice and ileocecal valve. The                            [id number] Colonoscope [id number] was introduced                            through the anus and advanced to the the cecum,                            identified by appendiceal orifice and ileocecal                            valve. The colonoscopy was performed without                            difficulty. The patient tolerated the procedure                            well. The quality of the bowel preparation was                            good. The ileocecal valve, appendiceal orifice, and                            rectum were photographed. Scope In: 8:44:48 AM Scope Out: 9:02:51 AM Scope Withdrawal Time: 0 hours 11 minutes 25 seconds  Total Procedure Duration: 0 hours 18 minutes 3 seconds  Findings:                 A 4 mm polyp was found in the mid transverse colon.                            The polyp was sessile. The polyp was removed with a                            cold snare.  Resection and retrieval were complete.                           Many medium-mouthed diverticula were found in the                            sigmoid colon and descending colon.                           Non-bleeding internal hemorrhoids were found during                            retroflexion. The hemorrhoids were small and Grade                            I (internal hemorrhoids that do not prolapse).                           The exam was otherwise without abnormality on                            direct and retroflexion views. The colon was highly                            redundant. Complications:            No immediate complications. Estimated Blood Loss:     Estimated blood loss: none. Impression:               - One 4 mm polyp in the mid transverse colon,                            removed with a cold snare. Resected and retrieved.                           - Moderate left colonic  diverticulosis.                           - Non-bleeding internal hemorrhoids.                           - The examination was otherwise normal on direct                            and retroflexion views. The colon was tortuous and                            somewhat redundant. Recommendation:           - Patient has a contact number available for                            emergencies. The signs and symptoms of potential                            delayed complications were discussed with the  patient. Return to normal activities tomorrow.                            Written discharge instructions were provided to the                            patient.                           - Resume previous diet.                           - Continue present medications including                            MiraLAX/Dulcolax.                           - Await pathology results.                           - Repeat colonoscopy for surveillance based on                            pathology results.                           - The findings and recommendations were discussed                            with the patient's family. Lynann Bologna, MD 08/15/2022 9:10:44 AM This report has been signed electronically.

## 2022-08-18 ENCOUNTER — Telehealth: Payer: Self-pay | Admitting: *Deleted

## 2022-08-18 NOTE — Telephone Encounter (Signed)
Post procedure follow up phone call. No answer at number given.  Unable to leave message on voicemail.

## 2022-08-20 DIAGNOSIS — G5603 Carpal tunnel syndrome, bilateral upper limbs: Secondary | ICD-10-CM | POA: Diagnosis not present

## 2022-08-20 DIAGNOSIS — M180 Bilateral primary osteoarthritis of first carpometacarpal joints: Secondary | ICD-10-CM | POA: Diagnosis not present

## 2022-08-21 ENCOUNTER — Encounter: Payer: Self-pay | Admitting: Gastroenterology

## 2022-08-21 ENCOUNTER — Other Ambulatory Visit: Payer: Self-pay | Admitting: Cardiology

## 2022-08-21 ENCOUNTER — Other Ambulatory Visit: Payer: Self-pay

## 2022-08-21 DIAGNOSIS — R1030 Lower abdominal pain, unspecified: Secondary | ICD-10-CM

## 2022-08-21 DIAGNOSIS — R1013 Epigastric pain: Secondary | ICD-10-CM

## 2022-08-21 DIAGNOSIS — R857 Abnormal histological findings in specimens from digestive organs and abdominal cavity: Secondary | ICD-10-CM

## 2022-08-21 NOTE — Progress Notes (Signed)
Patient made aware to get lab work done and wants to come to the office for this. Gave her the information regarding lab. Order placed

## 2022-08-22 ENCOUNTER — Other Ambulatory Visit: Payer: BC Managed Care – PPO

## 2022-08-22 DIAGNOSIS — R1013 Epigastric pain: Secondary | ICD-10-CM

## 2022-08-22 DIAGNOSIS — R1030 Lower abdominal pain, unspecified: Secondary | ICD-10-CM

## 2022-08-22 DIAGNOSIS — R857 Abnormal histological findings in specimens from digestive organs and abdominal cavity: Secondary | ICD-10-CM | POA: Diagnosis not present

## 2022-08-28 DIAGNOSIS — R002 Palpitations: Secondary | ICD-10-CM | POA: Diagnosis not present

## 2022-09-02 DIAGNOSIS — E039 Hypothyroidism, unspecified: Secondary | ICD-10-CM | POA: Diagnosis not present

## 2022-09-02 DIAGNOSIS — K219 Gastro-esophageal reflux disease without esophagitis: Secondary | ICD-10-CM | POA: Diagnosis not present

## 2022-09-02 DIAGNOSIS — E559 Vitamin D deficiency, unspecified: Secondary | ICD-10-CM | POA: Diagnosis not present

## 2022-09-02 DIAGNOSIS — M436 Torticollis: Secondary | ICD-10-CM | POA: Diagnosis not present

## 2022-09-02 DIAGNOSIS — I10 Essential (primary) hypertension: Secondary | ICD-10-CM | POA: Diagnosis not present

## 2022-09-02 DIAGNOSIS — E785 Hyperlipidemia, unspecified: Secondary | ICD-10-CM | POA: Diagnosis not present

## 2022-09-02 DIAGNOSIS — Z79899 Other long term (current) drug therapy: Secondary | ICD-10-CM | POA: Diagnosis not present

## 2022-09-04 ENCOUNTER — Telehealth: Payer: Self-pay

## 2022-09-04 NOTE — Telephone Encounter (Signed)
-----   Message from Gypsy Balsam sent at 09/03/2022  3:49 PM EDT ----- Monitor shows some episode of supraventricular tachycardia asymptomatic, no need to treat

## 2022-09-04 NOTE — Telephone Encounter (Signed)
Patient notified through my chart.

## 2022-09-09 ENCOUNTER — Ambulatory Visit: Payer: BC Managed Care – PPO

## 2022-09-09 DIAGNOSIS — R0609 Other forms of dyspnea: Secondary | ICD-10-CM | POA: Diagnosis not present

## 2022-09-09 LAB — ECHOCARDIOGRAM COMPLETE
Area-P 1/2: 4.4 cm2
MV M vel: 4.32 m/s
MV Peak grad: 74.6 mmHg
S' Lateral: 2.4 cm

## 2022-09-11 ENCOUNTER — Telehealth: Payer: Self-pay

## 2022-09-11 NOTE — Telephone Encounter (Signed)
Left message on My Chart with normal results per Dr. Krasowski's note. Routed to PCP. 

## 2022-09-23 DIAGNOSIS — J069 Acute upper respiratory infection, unspecified: Secondary | ICD-10-CM | POA: Diagnosis not present

## 2022-10-28 DIAGNOSIS — N393 Stress incontinence (female) (male): Secondary | ICD-10-CM | POA: Diagnosis not present

## 2022-10-28 DIAGNOSIS — N816 Rectocele: Secondary | ICD-10-CM | POA: Diagnosis not present

## 2022-10-28 DIAGNOSIS — N6320 Unspecified lump in the left breast, unspecified quadrant: Secondary | ICD-10-CM | POA: Diagnosis not present

## 2022-10-30 ENCOUNTER — Telehealth: Payer: Self-pay | Admitting: Gastroenterology

## 2022-10-30 NOTE — Telephone Encounter (Signed)
Pt states she has a rectocele and is wanting to have surgery to correct this. She states Dr. Chales Abrahams told her that her colon goes up and down. GYN told that there were other meds out the that could help with her bowel issues. She was scheduled to see Dr. Chales Abrahams 10/17 @8 :50am. Pt wanted to see him sooner that this, let pt know this is his first available. Pt took the appt but wants to be placed on cancellation list but states it is so hard to get through on the phone. Encouraged pt to use mychart to see if there have been cancellations and then we can let he know if there have been any. She verbalized understanding and wanted to know how to get her records. Pt give the phone number [phone] for HIM dept.

## 2022-10-30 NOTE — Telephone Encounter (Signed)
Inbound call from patient stating she is going to have surgery and needs to be seen sooner with Dr. Chales Abrahams. Patient requesting a call back as soon as possible. Please advise, thank you.

## 2022-11-03 DIAGNOSIS — N6012 Diffuse cystic mastopathy of left breast: Secondary | ICD-10-CM | POA: Diagnosis not present

## 2022-11-03 DIAGNOSIS — R92333 Mammographic heterogeneous density, bilateral breasts: Secondary | ICD-10-CM | POA: Diagnosis not present

## 2022-11-03 DIAGNOSIS — R921 Mammographic calcification found on diagnostic imaging of breast: Secondary | ICD-10-CM | POA: Diagnosis not present

## 2022-11-03 DIAGNOSIS — N6011 Diffuse cystic mastopathy of right breast: Secondary | ICD-10-CM | POA: Diagnosis not present

## 2022-11-03 DIAGNOSIS — N6322 Unspecified lump in the left breast, upper inner quadrant: Secondary | ICD-10-CM | POA: Diagnosis not present

## 2022-11-03 DIAGNOSIS — N6321 Unspecified lump in the left breast, upper outer quadrant: Secondary | ICD-10-CM | POA: Diagnosis not present

## 2022-11-03 DIAGNOSIS — N6325 Unspecified lump in the left breast, overlapping quadrants: Secondary | ICD-10-CM | POA: Diagnosis not present

## 2022-11-06 ENCOUNTER — Ambulatory Visit: Payer: BC Managed Care – PPO | Admitting: Gastroenterology

## 2022-11-06 ENCOUNTER — Other Ambulatory Visit (INDEPENDENT_AMBULATORY_CARE_PROVIDER_SITE_OTHER): Payer: BC Managed Care – PPO

## 2022-11-06 ENCOUNTER — Encounter: Payer: Self-pay | Admitting: Gastroenterology

## 2022-11-06 ENCOUNTER — Telehealth: Payer: Self-pay | Admitting: Cardiology

## 2022-11-06 VITALS — BP 132/84 | HR 82 | Wt 149.0 lb

## 2022-11-06 DIAGNOSIS — R1013 Epigastric pain: Secondary | ICD-10-CM

## 2022-11-06 DIAGNOSIS — Z860100 Personal history of colon polyps, unspecified: Secondary | ICD-10-CM

## 2022-11-06 DIAGNOSIS — K581 Irritable bowel syndrome with constipation: Secondary | ICD-10-CM | POA: Diagnosis not present

## 2022-11-06 LAB — HIGH SENSITIVITY CRP: CRP, High Sensitivity: 1.28 mg/L (ref 0.000–5.000)

## 2022-11-06 LAB — COMPREHENSIVE METABOLIC PANEL
ALT: 21 U/L (ref 0–35)
AST: 19 U/L (ref 0–37)
Albumin: 4.3 g/dL (ref 3.5–5.2)
Alkaline Phosphatase: 54 U/L (ref 39–117)
BUN: 9 mg/dL (ref 6–23)
CO2: 30 meq/L (ref 19–32)
Calcium: 9.4 mg/dL (ref 8.4–10.5)
Chloride: 101 meq/L (ref 96–112)
Creatinine, Ser: 0.65 mg/dL (ref 0.40–1.20)
GFR: 93.45 mL/min (ref >60.00)
Glucose, Bld: 84 mg/dL (ref 70–99)
Potassium: 3.8 meq/L (ref 3.5–5.1)
Sodium: 138 meq/L (ref 135–145)
Total Bilirubin: 0.4 mg/dL (ref 0.2–1.2)
Total Protein: 7.2 g/dL (ref 6.0–8.3)

## 2022-11-06 LAB — CBC WITH DIFFERENTIAL/PLATELET
Basophils Absolute: 0 10*3/uL (ref 0.0–0.1)
Basophils Relative: 0.5 % (ref 0.0–3.0)
Eosinophils Absolute: 0.1 10*3/uL (ref 0.0–0.7)
Eosinophils Relative: 0.7 % (ref 0.0–5.0)
HCT: 43.6 % (ref 36.0–46.0)
Hemoglobin: 14.2 g/dL (ref 12.0–15.0)
Lymphocytes Relative: 24.1 % (ref 12.0–46.0)
Lymphs Abs: 2 10*3/uL (ref 0.7–4.0)
MCHC: 32.7 g/dL (ref 30.0–36.0)
MCV: 94.1 fL (ref 78.0–100.0)
Monocytes Absolute: 0.9 10*3/uL (ref 0.1–1.0)
Monocytes Relative: 11.1 % (ref 3.0–12.0)
Neutro Abs: 5.4 10*3/uL (ref 1.4–7.7)
Neutrophils Relative %: 63.6 % (ref 43.0–77.0)
Platelets: 346 10*3/uL (ref 150.0–400.0)
RBC: 4.63 Mil/uL (ref 3.87–5.11)
RDW: 13 % (ref 11.5–15.5)
WBC: 8.5 10*3/uL (ref 4.0–10.5)

## 2022-11-06 MED ORDER — LINACLOTIDE 145 MCG PO CAPS: 145.0000 ug | ORAL_CAPSULE | Freq: Every day | ORAL | 3 refills | Status: DC

## 2022-11-06 NOTE — Telephone Encounter (Signed)
Spoke with pt. She stated she wanted to see if she could be seen sooner than Wednesday. She is having some dyspnea on exertion. She stated that she was dancing when she answered the phone. She stated that she has some chest pressure when lying down. She said she knows to go to the ER if her symptoms worsen. Will send to front desk to see if she can get in any sooner with Dr. Bing Matter or Revankar per pt request.

## 2022-11-06 NOTE — Progress Notes (Signed)
Chief Complaint: GI problems  Referring Provider:  Lucianne Lei, MD      ASSESSMENT AND PLAN;   #1. GERD/epi pain/nonulcer dyspepsia. S/P lap chole in past. Neg CT AP 12/2021, neg MRCP, EGD.  Per patient she is having side effects from Protonix in form of fatigue.  #2. IBS-C with bloating/lower abdo pain (with prev Dx cathretic colon)  #3. H/O polyp 07/2022, FH CRC (brother at 31). Recall colon 07/2027   Plan:  -Decrease protonix 40 every other day x 2 weeks, then stop and use it prn. -CT AP with contrast for abdo pain -CBC, CMP, CRP -Continue dulolax 2/day with miralax 17g poQD -Linzess po every day #90 -Consider sitz marker study (to r/o outlet obstruction) and anorectal manometry to r/o pelvic dyssynergia.  She will let us know if/when she decides. -Offered another opinion at [facility] for another opinion.  She will let us know if/when she decides. -D/W pt and husband in detail.  Spent >1 hr discussing.     HPI:    Jasmine Hahn is a 62 y.o. female  With asthma, hypothyroidism, significant anxiety/insomnia  With multiple GI problems, has been seen by multiple gastroenterologists.  Prev pt of Dr. Vangie Bicker since he has retired.  Here with her husband to go over results of EGD/colonoscopy/after GYN evaluation.  -EGD was normal.  Neg eso Bx for EoE.  Gastric bx with mild gastritis, negative HP.  Duodenal biopsies showed focal villous atrophy with increased intraepithelial lymphocytes.  Negative celiac serology.  Patient was started on empiric Protonix 40 mg p.o. daily.  She had complete resolution of heartburn.  No further epigastric or chest pains.  However, she told me she is having fatigue and malaise ever since Protonix has been started.  She would like to wean her off.  -Gone over colonoscopy report with her in detail.  She did have torturous and somewhat redundant colon.  She also has IBS-C with abdominal bloating.  We have also gone over Dr.  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  She has been on lactulose, senna, herbal laxatives.  He has been doing much better with Dulcolax 2/day with MiraLAX.   Has been seen by Dr. Guy Franco. Dx with vaginal prolapse with rectocele.  Has been advised surgery after constipation is better.  She would like to try Linzess which she tried in past several years ago.  -Also has been complaining of abdominal pain, with abdominal bloating, more so since colonoscopy.  This pain does not always get better with BMs.  She denies having any fever or chills.  She is very anxious about her symptoms.  Per front staff, refusing to pay co-pay.    From previous notes: -C/O longstanding GERD- x  15-100yrs With epi pain- stabbing x 6-7 yrs, getting worse lately, exacerbated by eating.  Has nausea but no vomiting.  He denies having any odynophagia or dysphagia.  No jaundice dark urine or pale stools.  She had negative CT, MRCP as below  -Longstanding history of constipation.  Per notes from Dr. 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  She has been on lactulose, senna, tried lately, herbal laxatives,    Past GI WU:  EGD 08/15/2022 -Normal EGD -Neg eso/gastric -duodenal Bx: focal villous atrophy with increased IELs.  Subsequent celiac serology was negative   Colonoscopy 08/15/2022 - One 4 mm polyp in the mid transverse colon, removed with a cold snare. Resected  and retrieved. Bx- TA - Moderate left colonic diverticulosis.  - Non-bleeding internal hemorrhoids.  - The examination was otherwise normal on direct and retroflexion views. The colon was tortuous and somewhat redundant - Rpt 7 yrs. Recall changed to 5 years d/t FH CRC (brother at age 67)   EGD Dr. Kinnie Scales 08/11/2019: Nl EGD   Colon 08/11/2019 (Dr. Kinnie Scales). FU adenomatous polyps/FH of CRC -Mild sigmoid diverticulosis -Otherwise normal colonoscopy. -Colon was redundant   Colonoscopy  05/2014 ([facility]) -Colonic polyp s/p polypectomy -Internal hemorrhoids -Bx: SSA  EGD 05/2014: Mild gastritis.  Otherwise normal EGD -Negative small bowel biopsies for celiac   CT AP 12/2021 1. Diarrheal state with findings suggestive of mild enterocolitis.  Clinical correlation is recommended. No bowel obstruction. Normal  appendix.  2. Focal area of pleural thickening or subpleural nodule in the  superior segment of the left lower lobe. Follow-up with chest CT in  3 months recommended.  3.  Aortic Atherosclerosis (ICD10-I70.0).   MRCP 12/02/2021 No acute findings.  Prior cholecystectomy. No evidence of biliary ductal dilatation or  choledocholithiasis.      FH-brother with CRC at age 35 (was a patient of ours)  Education administrator, single, 4 children, no alcohol or smoking. Past Medical History:  Diagnosis Date   Adenomatous colon polyp    Aftercare following right knee joint replacement surgery 09/01/2017   ALLERGIC RHINITIS, SEASONAL 06/26/2006   Qualifier: Diagnosis of  By: Drue Novel MD, Nolon Rod.    Allergy    Angina pectoris (HCC) 07/03/2021   Asthma    Atypical chest pain 04/27/2018   Cervical cancer (HCC)    Chronic constipation 07/13/2019   COVID-19 02/25/2019   Discoid lupus    Diverticulosis    Dyspnea on exertion 07/03/2021   FATIGUE 08/23/2007   Qualifier: Diagnosis of  By: Janit Bern     Former tobacco use 07/29/2017   Frontal headache 01/31/2021   GERD 06/26/2006   Qualifier: Diagnosis of  By: Drue Novel MD, Nolon Rod.    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    Hypothyroidism, postsurgical 07/29/2017   Kidney stones    MAXILLARY SINUSITIS 06/26/2006   Qualifier:  Diagnosis of  By: Drue Novel MD, Nolon Rod.    Memory impairment    Palpitations 11/24/2017   Peptic ulcer    SINUSITIS- ACUTE-NOS 12/29/2006   Qualifier: Diagnosis of  By: Drue Novel MD, Nolon Rod.    Syncope    not sure why   Thyroid disease    hypo   URI 08/23/2007   Qualifier: Diagnosis of  By: Janit Bern     Wears partial dentures     Past Surgical History:  Procedure Laterality Date   ABDOMINAL HYSTERECTOMY     bone spurs     feet-both feet   CHOLECYSTECTOMY     COLONOSCOPY  08/11/2019   Dr Kinnie Scales. No neoplasia. Diverticulosis of colon   COLONOSCOPY  06/02/2014   Colon polyp status post polypectomy. Internal hemorrhoids   ELBOW SURGERY     Dr Zachary George   ESOPHAGOGASTRODUODENOSCOPY  02/2011   Medoff, NEG   ESOPHAGOGASTRODUODENOSCOPY  08/11/2019   Dr Kinnie Scales. Normal EGD examination   ESOPHAGOGASTRODUODENOSCOPY  06/02/2014   Mid gastritis. Otherwise normal EGD   KNEE ARTHROSCOPY WITH DRILLING/MICROFRACTURE Right 05/25/2014   Procedure: KNEE ARTHROSCOPY WITH MICROFRACTURE PATELLA FEMORAL CONDYLE;  Surgeon: Mckinley Jewel, MD;  Location: [facility];  Service: Orthopedics;  Laterality: Right;   KNEE ARTHROSCOPY WITH MEDIAL MENISECTOMY Right 05/25/2014   Procedure: RIGHT KNEE ARTHROSCOPY WITH PARTIAL MEDIAL;  Surgeon: Mckinley Jewel, MD;  Location: [facility];  Service: Orthopedics;  Laterality: Right;   LEFT HEART CATH AND CORONARY ANGIOGRAPHY N/A 07/05/2021   Procedure: LEFT HEART CATH AND CORONARY ANGIOGRAPHY;  Surgeon: Lennette Bihari, MD;  Location: [facility];  Service: Cardiovascular;  Laterality: N/A;   LYSIS OF ADHESION Right 05/25/2014   Procedure: LYSIS OF ADHESION;  Surgeon: Mckinley Jewel, MD;  Location: [facility];  Service: Orthopedics;  Laterality: Right;   MOHS SURGERY     OOPHORECTOMY     PARTIAL KNEE ARTHROPLASTY Right 04/17/2016   Procedure: RIGHT KNEE UNICOMPARTMENTAL ARTHROPLASTY;  Surgeon: Loreta Ave, MD;   Location: [facility];  Service: Orthopedics;  Laterality: Right;   REPLACEMENT TOTAL KNEE Right    Skin removed on L leg     THYROIDECTOMY     x 2 surgeries - partial first time   TUBAL LIGATION      Family History  Problem Relation Age of Onset   Stroke Mother    Hypertension Mother    Diabetes Mother    Heart disease Mother    Hyperlipidemia Mother    Hypothyroidism Mother    Colon polyps Mother    Heart attack Mother    Esophageal cancer Father    Lung cancer Father    Throat cancer Father    Esophageal cancer Brother    Colon cancer Brother 72   Hypertension Brother    Hyperlipidemia Brother    Cancer - Colon Brother    Lung cancer Brother    Heart disease Maternal Grandmother    COPD Maternal Grandfather    Colon polyps Daughter    Rectal cancer Neg Hx    Stomach cancer Neg Hx     Social History   Tobacco Use   Smoking status: Former    Current packs/day: 0.00    Average packs/day: 0.5 packs/day for 30.0 years (15.0 ttl pk-yrs)    Types: Cigarettes    Start date: 05/20/1981    Quit date: 05/21/2011    Years since quitting: 11.4   Smokeless tobacco: Never  Vaping Use   Vaping status: Never Used  Substance Use Topics   Alcohol use: Never   Drug use: Not Currently    Types: Marijuana    Current Outpatient Medications  Medication Sig Dispense Refill   AMBULATORY NON FORMULARY MEDICATION Take 1 tablet by mouth daily. Liquid Silver Take 1 tablespoon daily     aspirin EC 81 MG tablet Take 1 tablet (81 mg total) by mouth daily. Swallow whole. 90 tablet 3   BIOTIN PO Take 10,000 mcg by mouth daily.     famotidine (PEPCID) 20 MG tablet Take 20 mg by mouth at bedtime.     Flax OIL Take 15 mLs by mouth daily. Flax seed oil/ Mix with Thrive by le-Val shake     fluticasone (FLONASE) 50 MCG/ACT nasal spray Place 1 spray into both nostrils in the morning, at noon, in the evening, and at bedtime.     levothyroxine (SYNTHROID) 125 MCG tablet Take 125 mcg by  mouth daily before breakfast.  metoprolol succinate (TOPROL-XL) 25 MG 24 hr tablet Take 1 tablet (25 mg total) by mouth daily. 90 tablet 2   montelukast (SINGULAIR) 10 MG tablet Take 10 mg by mouth at bedtime.     Multiple Vitamins-Minerals (CENTRUM SILVER ADULT 50+ PO) Take 1 tablet by mouth daily.     nitroGLYCERIN (NITROSTAT) 0.4 MG SL tablet Place 0.4 mg under the tongue every 5 (five) minutes as needed for chest pain.     pantoprazole (PROTONIX) 40 MG tablet Take 1 tablet (40 mg total) by mouth daily. 90 tablet 4   polyethylene glycol powder (GLYCOLAX/MIRALAX) 17 GM/SCOOP powder Take 17 g by mouth daily. Mix with Thrive     Quercetin 500 MG CAPS Take 500 mg by mouth daily. Plus     rosuvastatin (CRESTOR) 10 MG tablet Take 1 tablet by mouth at bedtime.     VENTOLIN HFA 108 (90 Base) MCG/ACT inhaler Inhale 1-2 puffs into the lungs every 4 (four) hours as needed for wheezing or shortness of breath.     vitamin B-12 (CYANOCOBALAMIN) 1000 MCG tablet Take 1,000 mcg by mouth daily.     VIVELLE-DOT 0.1 MG/24HR patch Place 1 patch onto the skin 2 (two) times a week.      zinc gluconate 50 MG tablet Take 50 mg by mouth at bedtime.     zolpidem (AMBIEN) 10 MG tablet Take 10 mg by mouth at bedtime.      nystatin cream (MYCOSTATIN) Apply 1 Application topically 2 (two) times daily. (Patient not taking: Reported on 08/15/2022)     No current facility-administered medications for this visit.    Allergies  Allergen Reactions   Dog Epithelium (Canis Lupus Familiaris) Other (See Comments)    Other reaction(s): Eye Swelling, Wheezing   Pollen Extract Other (See Comments) and Tinitus    Other reaction(s): Headache, Other   Uncaria Tomentosa (Cats Claw)     Other reaction(s): eye redness, eye swelling, respiratory distress   Prednisone Other (See Comments)    Causes patient to gain weight    Cat Hair Extract Other (See Comments)    Eye Redness, Eye Swelling, Respiratory Distress     Review of  Systems:  Has anxiety.     Physical Exam:    BP 132/84   Pulse 82   Wt 149 lb (67.6 kg)   BMI 29.10 kg/m  Wt Readings from Last 3 Encounters:  11/06/22 149 lb (67.6 kg)  08/15/22 146 lb (66.2 kg)  08/12/22 145 lb (65.8 kg)   Constitutional:  Well-developed, in no acute distress. Psychiatric: Normal mood and affect. Behavior is normal. HEENT: Pupils normal.  Conjunctivae are normal. No scleral icterus. Cardiovascular: Normal rate, regular rhythm. No edema Pulmonary/chest: Effort normal and breath sounds normal. No wheezing, rales or rhonchi. Abdominal: Soft, nondistended. Nontender. Bowel sounds active throughout. There are no masses palpable. No hepatomegaly. Rectal: Deferred.  I offered a rectal exam which she politely refused. Neurological: Alert and oriented to person place and time. Skin: Skin is warm and dry. No rashes noted.  Data Reviewed: I have personally reviewed following labs and imaging studies  CBC:    Latest Ref Rng & Units 07/03/2021    2:00 PM 06/26/2021    6:45 PM 01/03/2021    3:55 PM  CBC  WBC 3.4 - 10.8 x10E3/uL 7.2  8.3  7.6   Hemoglobin 11.1 - 15.9 g/dL 16.1  09.6  04.5   Hematocrit 34.0 - 46.6 % 43.1  41.6  40.1  Platelets 150 - 450 x10E3/uL 339  319  366     CMP:    Latest Ref Rng & Units 07/03/2021    2:00 PM 06/26/2021    6:45 PM 01/03/2021    3:55 PM  CMP  Glucose 70 - 99 mg/dL 80  86  90   BUN 8 - 27 mg/dL 10  17  8    Creatinine 0.57 - 1.00 mg/dL 1.61  0.96  0.45   Sodium 134 - 144 mmol/L 140  137  136   Potassium 3.5 - 5.2 mmol/L 4.5  3.9  3.8   Chloride 96 - 106 mmol/L 102  105  103   CO2 20 - 29 mmol/L 23  28  25    Calcium 8.7 - 10.3 mg/dL 9.6  9.7  9.1         Edman Circle, MD 11/06/2022, 9:23 AM  Cc: Lucianne Lei, MD

## 2022-11-06 NOTE — Telephone Encounter (Signed)
Pt c/o of Chest Pain: STAT if CP now or developed within 24 hours  1. Are you having CP right now? No.    2. Are you experiencing any other symptoms (ex. SOB, nausea, vomiting, sweating)? SOB   3. How long have you been experiencing CP? A week   4. Is your CP continuous or coming and going? Coming and going with exertion   5. Have you taken Nitroglycerin? No  ?    Pt c/o Shortness Of Breath: STAT if SOB developed within the last 24 hours or pt is noticeably SOB on the phone  1. Are you currently SOB (can you hear that pt is SOB on the phone)? No   2. How long have you been experiencing SOB? A week   3. Are you SOB when sitting or when up moving around? When moving around   4. Are you currently experiencing any other symptoms? Higher HR varying between 70-170. Current HR 85  States that when she lays down and turns on side it feels like she is crushing her heart. Adding CP questions above.

## 2022-11-06 NOTE — Patient Instructions (Addendum)
Your provider has requested that you go to the basement level for lab work before leaving today. Press "B" on the elevator. The lab is located at the first door on the left as you exit the elevator.  We have sent the following medications to your pharmacy for you to pick up at your convenience: Linzess   Decrease pantoprazole 40 mg every other day for  2 weeks then take as needed.   You have been scheduled for a CT scan of the abdomen and pelvis at [facility], 1st floor Radiology. You are scheduled on 11/14/22 at 9 am. You should arrive 15 minutes prior to your appointment time for registration. Nothing to eat or drink after midnight.  You may take any medications as prescribed with a small amount of water, if necessary. If you take any of the following medications: METFORMIN, GLUCOPHAGE, GLUCOVANCE, AVANDAMET, RIOMET, FORTAMET, ACTOPLUS MET, JANUMET, GLUMETZA or METAGLIP, you MAY be asked to HOLD this medication 48 hours AFTER the exam.   The purpose of you drinking the oral contrast is to aid in the visualization of your intestinal tract. The contrast solution may cause some diarrhea. Depending on your individual set of symptoms, you may also receive an intravenous injection of x-ray contrast/dye. Plan on being at [facility] for 45 minutes or longer, depending on the type of exam you are having performed.   If you have any questions regarding your exam or if you need to reschedule, you may call [facility] at [phone] between the hours of 8:00 am and 5:00 pm, Monday-Friday.     If your blood pressure at your visit was 140/90 or greater, please contact your primary care physician to follow up on this.  _______________________________________________________  If you are age 53 or older, your body mass index should be between 23-30. Your Body mass index is 29.1 kg/m. If this is out of the aforementioned range listed, please consider follow up with your Primary Care  Provider.  If you are age 50 or younger, your body mass index should be between 19-25. Your Body mass index is 29.1 kg/m. If this is out of the aformentioned range listed, please consider follow up with your Primary Care Provider.   ________________________________________________________  The [facility] providers would like to encourage you to use [facility] to communicate with providers for non-urgent requests or questions.  Due to long hold times on the telephone, sending your provider a message by [facility] may be a faster and more efficient way to get a response.  Please allow 48 business hours for a response.  Please remember that this is for non-urgent requests.  _______________________________________________________  Thank you for entrusting me with your care and choosing [facility].  Dr Chales Abrahams

## 2022-11-10 ENCOUNTER — Other Ambulatory Visit: Payer: Self-pay

## 2022-11-10 DIAGNOSIS — K5790 Diverticulosis of intestine, part unspecified, without perforation or abscess without bleeding: Secondary | ICD-10-CM | POA: Insufficient documentation

## 2022-11-10 DIAGNOSIS — C539 Malignant neoplasm of cervix uteri, unspecified: Secondary | ICD-10-CM | POA: Insufficient documentation

## 2022-11-10 DIAGNOSIS — J45909 Unspecified asthma, uncomplicated: Secondary | ICD-10-CM | POA: Insufficient documentation

## 2022-11-10 DIAGNOSIS — K279 Peptic ulcer, site unspecified, unspecified as acute or chronic, without hemorrhage or perforation: Secondary | ICD-10-CM | POA: Insufficient documentation

## 2022-11-10 DIAGNOSIS — R413 Other amnesia: Secondary | ICD-10-CM | POA: Insufficient documentation

## 2022-11-10 DIAGNOSIS — N200 Calculus of kidney: Secondary | ICD-10-CM | POA: Insufficient documentation

## 2022-11-10 DIAGNOSIS — D126 Benign neoplasm of colon, unspecified: Secondary | ICD-10-CM | POA: Insufficient documentation

## 2022-11-12 ENCOUNTER — Encounter: Payer: Self-pay | Admitting: Cardiology

## 2022-11-12 ENCOUNTER — Ambulatory Visit: Payer: BC Managed Care – PPO | Attending: Cardiology | Admitting: Cardiology

## 2022-11-12 ENCOUNTER — Telehealth (HOSPITAL_COMMUNITY): Payer: Self-pay | Admitting: *Deleted

## 2022-11-12 VITALS — BP 122/80 | HR 84 | Ht 60.0 in | Wt 149.2 lb

## 2022-11-12 DIAGNOSIS — R002 Palpitations: Secondary | ICD-10-CM

## 2022-11-12 DIAGNOSIS — E039 Hypothyroidism, unspecified: Secondary | ICD-10-CM | POA: Diagnosis not present

## 2022-11-12 DIAGNOSIS — I2510 Atherosclerotic heart disease of native coronary artery without angina pectoris: Secondary | ICD-10-CM

## 2022-11-12 DIAGNOSIS — R0609 Other forms of dyspnea: Secondary | ICD-10-CM

## 2022-11-12 DIAGNOSIS — E782 Mixed hyperlipidemia: Secondary | ICD-10-CM | POA: Diagnosis not present

## 2022-11-12 NOTE — Addendum Note (Signed)
Addended by: Eleonore Chiquito on: 11/12/2022 08:42 AM   Modules accepted: Orders

## 2022-11-12 NOTE — Telephone Encounter (Signed)
Patient given detailed instructions per Myocardial Perfusion Study Information Sheet for the test on 11/19/2022 at 7:45. Patient notified to arrive 15 minutes early and that it is imperative to arrive on time for appointment to keep from having the test rescheduled.  If you need to cancel or reschedule your appointment, please call the office within 24 hours of your appointment. . Patient verbalized understanding.Jasmine Hahn

## 2022-11-12 NOTE — Progress Notes (Signed)
Cardiology Office Note:    Date:  11/12/2022   ID:  Jasmine Hahn, DOB 01-26-1960, MRN [id number]  PCP:  Lucianne Lei, MD  Cardiologist:  Gypsy Balsam, MD    Referring MD: Lucianne Lei, MD   No chief complaint on file.   History of Present Illness:    Jasmine Hahn is a 62 y.o. female past medical history significant for PVCs, palpitations, atypical chest pain, cardiac catheterization done years ago showing nonobstructive disease, and is being managed medically.  She comes today to months for follow-up.  She complained of having shortness of breath she thinks I will be worse than before also some chest pain pain however happen typically at rest when she lays down.  She takes some proton pump inhibitors and MiraLAX that seems to be helping.  They are what bothers her the most and this is the main topic of conversation today is shortness of breath.  She said that she likes line dancing and when she does it sometimes she have to slow down and stop because of shortness of breath.  No swelling of lower EXTR lower extremities.  No proximal nocturnal dyspnea.  She does have history of smoking but stopped years ago  Past Medical History:  Diagnosis Date   Adenomatous colon polyp    Aftercare following right knee joint replacement surgery 09/01/2017   ALLERGIC RHINITIS, SEASONAL 06/26/2006   Qualifier: Diagnosis of  By: Drue Novel MD, Nolon Rod.    Allergy    Angina pectoris (HCC) 07/03/2021   Arthritis    Asthma    Atypical chest pain 04/27/2018   CAD (coronary artery disease) 08/05/2021   Cervical cancer (HCC)    Chronic constipation 07/13/2019   COVID-19 02/25/2019   Discoid lupus    Diverticulosis    Dyspnea on exertion 07/03/2021   FATIGUE 08/23/2007   Qualifier: Diagnosis of  By: Janit Bern     Former tobacco use 07/29/2017   Frontal headache 01/31/2021   GERD 06/26/2006   Qualifier: Diagnosis of  By: Drue Novel MD, Nolon Rod.    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    Syncope    not sure why   Thyroid disease    hypo   URI 08/23/2007   Qualifier: Diagnosis of   By: Janit Bern      Replacing diagnoses that were inactivated after the 04/21/22 regulatory import     Wears partial dentures     Past Surgical History:  Procedure Laterality Date   ABDOMINAL HYSTERECTOMY     bone spurs     feet-both feet   CHOLECYSTECTOMY     COLONOSCOPY  08/11/2019   Dr Kinnie Scales. No neoplasia. Diverticulosis of colon   COLONOSCOPY  06/02/2014   Colon polyp status post polypectomy. Internal hemorrhoids   ELBOW  SURGERY     Dr Zachary George   ESOPHAGOGASTRODUODENOSCOPY  02/2011   Medoff, NEG   ESOPHAGOGASTRODUODENOSCOPY  08/11/2019   Dr Kinnie Scales. Normal EGD examination   ESOPHAGOGASTRODUODENOSCOPY  06/02/2014   Mid gastritis. Otherwise normal EGD   KNEE ARTHROSCOPY WITH DRILLING/MICROFRACTURE Right 05/25/2014   Procedure: KNEE ARTHROSCOPY WITH MICROFRACTURE PATELLA FEMORAL CONDYLE;  Surgeon: Mckinley Jewel, MD;  Location: [facility];  Service: Orthopedics;  Laterality: Right;   KNEE ARTHROSCOPY WITH MEDIAL MENISECTOMY Right 05/25/2014   Procedure: RIGHT KNEE ARTHROSCOPY WITH PARTIAL MEDIAL;  Surgeon: Mckinley Jewel, MD;  Location: [facility];  Service: Orthopedics;  Laterality: Right;   LEFT HEART CATH AND  CORONARY ANGIOGRAPHY N/A 07/05/2021   Procedure: LEFT HEART CATH AND CORONARY ANGIOGRAPHY;  Surgeon: Lennette Bihari, MD;  Location: [facility];  Service: Cardiovascular;  Laterality: N/A;   LYSIS OF ADHESION Right 05/25/2014   Procedure: LYSIS OF ADHESION;  Surgeon: Mckinley Jewel, MD;  Location: [facility];  Service: Orthopedics;  Laterality: Right;   MOHS SURGERY     OOPHORECTOMY     PARTIAL KNEE ARTHROPLASTY Right 04/17/2016   Procedure: RIGHT KNEE UNICOMPARTMENTAL ARTHROPLASTY;  Surgeon: Loreta Ave, MD;  Location:  SURGERY CENTER;  Service: Orthopedics;  Laterality: Right;   REPLACEMENT TOTAL KNEE Right    Skin removed on L leg     THYROIDECTOMY     x 2 surgeries - partial first time   TUBAL LIGATION      Current Medications: No outpatient medications have been marked as taking for the 11/12/22 encounter (Office Visit) with Georgeanna [name], MD.     Allergies:   Dog epithelium (canis lupus familiaris), Pollen extract, Uncaria tomentosa (cats claw), Prednisone, and Cat hair extract   Social History   Socioeconomic History   Marital status: Single    Spouse name: Not on file   Number of children: 4   Years of education: Not on file   Highest education level: Not on file  Occupational History   Occupation: Secondary school teacher  Tobacco Use   Smoking status: Former    Current packs/day: 0.00    Average packs/day: 0.5 packs/day for 30.0 years (15.0 ttl pk-yrs)    Types: Cigarettes    Start date: 05/20/1981    Quit date: 05/21/2011    Years since quitting: 11.4   Smokeless tobacco: Never  Vaping Use   Vaping status: Never Used  Substance and Sexual Activity   Alcohol use: Never   Drug use: Not Currently    Types: Marijuana   Sexual activity: Not on file  Other Topics Concern   Not on file  Social History Narrative   Not on file   Social Determinants of Health   Financial Resource Strain: Not on file  Food Insecurity: Not on file   Transportation Needs: Not on file  Physical Activity: Not on file  Stress: Not on file  Social Connections: Unknown (05/31/2021)   [facility]   Social Network    Social Network: Not on file     Family History: The patient's family history includes COPD in her maternal grandfather; Cancer - Colon in her brother; Colon cancer (age of onset: 4) in her brother; Colon polyps in her daughter and mother; Diabetes in her mother; Esophageal cancer in her brother and father; Heart attack in her mother; Heart disease in her maternal grandmother and mother; Hyperlipidemia in her brother and mother; Hypertension in her  brother and mother; Hypothyroidism in her mother; Lung cancer in her brother and father; Stroke in her mother; Throat cancer in her father. There is no history of Rectal cancer or Stomach cancer. ROS:   Please see the history of present illness.    All 14 point review of systems negative except as described per history of present illness  EKGs/Labs/Other Studies Reviewed:    EKG Interpretation Date/Time:  Wednesday November 12 2022 08:08:02 EDT Ventricular Rate:  84 PR Interval:  136 QRS Duration:  84 QT Interval:  354 QTC Calculation: 418 R Axis:   81  Text Interpretation: Normal sinus rhythm Nonspecific ST abnormality Abnormal ECG When compared with ECG of 12-Aug-2022 08:09, No significant change was found Confirmed by Gypsy Balsam [phone]) on 11/12/2022 8:11:08 AM    Recent Labs: 11/06/2022: ALT 21; BUN 9; Creatinine, Ser 0.65; Hemoglobin 14.2; Platelets 346.0; Potassium 3.8; Sodium 138  Recent Lipid Panel    Component Value Date/Time   CHOL 174 01/25/2014 1722   TRIG 252 (H) 01/25/2014 1722   HDL 40 01/25/2014 1722   CHOLHDL 4.4 01/25/2014 1722   VLDL 50 (H) 01/25/2014 1722   LDLCALC 84 01/25/2014 1722    Physical Exam:    VS:  BP 122/80   Pulse 84   Ht 5' (1.524 m)   Wt 149 lb 3.2 oz (67.7 kg)   SpO2 96%   BMI 29.14 kg/m      Wt Readings from Last 3 Encounters:  11/12/22 149 lb 3.2 oz (67.7 kg)  11/06/22 149 lb (67.6 kg)  08/15/22 146 lb (66.2 kg)     GEN:  Well nourished, well developed in no acute distress HEENT: Normal NECK: No JVD; No carotid bruits LYMPHATICS: No lymphadenopathy CARDIAC: RRR, no murmurs, no rubs, no gallops RESPIRATORY:  Clear to auscultation without rales, wheezing or rhonchi  ABDOMEN: Soft, non-tender, non-distended MUSCULOSKELETAL:  No edema; No deformity  SKIN: Warm and dry LOWER EXTREMITIES: no swelling NEUROLOGIC:  Alert and oriented x 3 PSYCHIATRIC:  Normal affect   ASSESSMENT:    1. Mixed hyperlipidemia   2. Coronary artery disease involving native coronary artery of native heart without angina pectoris   3. Acquired hypothyroidism   4. Dyspnea on exertion   5. Palpitations    PLAN:    In order of problems listed above:  Constellation of symptoms including shortness of breath.  I will schedule her to have exercise Cardiolite to see if she get any inducible ischemia and also see her exercise tolerance.  I will not alter any of her medications. Why did review echocardiogram with her done recently which showed preserved left ventricle ejection fraction.  I do not think the shortness of breath that she is experiencing is related to her heart but the stress test will be done for completion.  If that is negative I will offer her evaluation by pulmonologist. Dyslipidemia I did review K PN which show me her LDL of 57 HDL 48 this is from 813 this year, she is taking Crestor 10 which I will continue. Palpitations denies having any   Medication Adjustments/Labs and Tests Ordered: Current medicines are reviewed at length with the patient today.  Concerns regarding medicines are outlined above.  Orders Placed This Encounter  Procedures   EKG 12-Lead   Medication changes: No orders of the defined types were placed in this encounter.   Signed, Georgeanna [name], MD,  [facility] 11/12/2022 8:31 AM    [facility]

## 2022-11-12 NOTE — Patient Instructions (Signed)
Medication Instructions:  Your physician recommends that you continue on your current medications as directed. Please refer to the Current Medication list given to you today.  *If you need a refill on your cardiac medications before your next appointment, please call your pharmacy*   Lab Work: None ordered If you have labs (blood work) drawn today and your tests are completely normal, you will receive your results only by: MyChart Message (if you have MyChart) OR A paper copy in the mail If you have any lab test that is abnormal or we need to change your treatment, we will call you to review the results.   Testing/Procedures: You are scheduled for a Myocardial Perfusion Imaging Study.  Please arrive 15 minutes prior to your appointment time for registration and insurance purposes.  The test will take approximately 3 to 4 hours to complete; you may bring reading material.  If someone comes with you to your appointment, they will need to remain in the main lobby due to limited space in the testing area.   How to prepare for your Myocardial Perfusion Test: Do not eat or drink 3 hours prior to your test, except you may have water. Do not consume products containing caffeine (regular or decaffeinated) 12 hours prior to your test. (ex: coffee, chocolate, sodas, tea). Do bring a list of your current medications with you.  If not listed below, you may take your medications as normal. Do not take metoprolol (Lopressor, Toprol) for 24 hours prior to the test.  Bring the medication to your appointment as you may be required to take it once the test is complete. Do wear comfortable clothes (no dresses or overalls) and walking shoes, tennis shoes preferred (No heels or open toe shoes are allowed). Do NOT wear cologne, perfume, aftershave, or lotions (deodorant is allowed). If these instructions are not followed, your test will have to be rescheduled.  If you cannot keep your appointment, please  provide 24 hours notification to the Nuclear Lab, to avoid a possible $50 charge to your account.  Follow-Up: At [facility], you and your health needs are our priority.  As part of our continuing mission to provide you with exceptional heart care, we have created designated Provider Care Teams.  These Care Teams include your primary Cardiologist (physician) and Advanced Practice Providers (APPs -  Physician Assistants and Nurse Practitioners) who all work together to provide you with the care you need, when you need it.  We recommend signing up for the patient portal called "MyChart".  Sign up information is provided on this After Visit Summary.  MyChart is used to connect with patients for Virtual Visits (Telemedicine).  Patients are able to view lab/test results, encounter notes, upcoming appointments, etc.  Non-urgent messages can be sent to your provider as well.   To learn more about what you can do with MyChart, go to [email or web address].    Your next appointment:   6 month(s)  Provider:   Gypsy Balsam, MD   Other Instructions  Cardiac Nuclear Scan A cardiac nuclear scan is a test that is done to check the flow of blood to your heart. It is done when you are resting and when you are exercising. The test looks for problems such as: Not enough blood reaching a portion of the heart. The heart muscle not working as it should. You may need this test if you have: Heart disease. Lab results that are not normal. Had heart surgery or a balloon procedure to  open up blocked arteries (angioplasty) or a small mesh tube (stent). Chest pain. Shortness of breath. Had a heart attack. In this test, a special dye (tracer) is put into your bloodstream. The tracer will travel to your heart. A camera will then take pictures of your heart to see how the tracer moves through your heart. This test is usually done at a hospital and takes 2-4 hours. Tell a doctor about: Any allergies you  have. All medicines you are taking, including vitamins, herbs, eye drops, creams, and over-the-counter medicines. Any bleeding problems you have. Any surgeries you have had. Any medical conditions you have. Whether you are pregnant or may be pregnant. Any history of asthma or long-term (chronic) lung disease. Any history of heart rhythm disorders or heart valve conditions. What are the risks? Your doctor will talk with you about risks. These may include: Serious chest pain and heart attack. This is only a risk if the stress portion of the test is done. Fast or uneven heartbeats (palpitations). A feeling of warmth in your chest. This feeling usually does not last long. Allergic reaction to the tracer. Shortness of breath or trouble breathing. What happens before the test? Ask your doctor about changing or stopping your normal medicines. Follow instructions from your doctor about what you cannot eat or drink. Remove your jewelry on the day of the test. Ask your doctor if you need to avoid nicotine or caffeine. What happens during the test? An IV tube will be inserted into one of your veins. Your doctor will give you a small amount of tracer through the IV tube. You will wait for 20-40 minutes while the tracer moves through your bloodstream. Your heart will be monitored with an electrocardiogram (ECG). You will lie down on an exam table. Pictures of your heart will be taken for about 15-20 minutes. You may also have a stress test. For this test, one of these things may be done: You will be asked to exercise on a treadmill or a stationary bike. You will be given medicines that will make your heart work harder. This is done if you are unable to exercise. When blood flow to your heart has peaked, a tracer will again be given through the IV tube. After 20-40 minutes, you will get back on the exam table. More pictures will be taken of your heart. Depending on the tracer that is used, more  pictures may need to be taken 3-4 hours later. Your IV tube will be removed when the test is over. The test may vary among doctors and hospitals. What happens after the test? Ask your doctor: Whether you can return to your normal schedule, including diet, activities, travel, and medicines. Whether you should drink more fluids. This will help to remove the tracer from your body. Ask your doctor, or the department that is doing the test: When will my results be ready? How will I get my results? What are my treatment options? What other tests do I need? What are my next steps? This information is not intended to replace advice given to you by your health care provider. Make sure you discuss any questions you have with your health care provider. Document Revised: 06/04/2021 Document Reviewed: 06/04/2021 Elsevier Patient Education  2023 [employer].

## 2022-11-14 ENCOUNTER — Ambulatory Visit (HOSPITAL_COMMUNITY)
Admission: RE | Admit: 2022-11-14 | Discharge: 2022-11-14 | Disposition: A | Discharge status: Home / Self Care | Payer: BC Managed Care – PPO | Source: Ambulatory Visit | Attending: Gastroenterology | Admitting: Gastroenterology

## 2022-11-14 ENCOUNTER — Telehealth: Payer: Self-pay | Admitting: Gastroenterology

## 2022-11-14 DIAGNOSIS — R109 Unspecified abdominal pain: Secondary | ICD-10-CM | POA: Diagnosis not present

## 2022-11-14 DIAGNOSIS — K5900 Constipation, unspecified: Secondary | ICD-10-CM | POA: Diagnosis not present

## 2022-11-14 DIAGNOSIS — R1013 Epigastric pain: Secondary | ICD-10-CM | POA: Diagnosis not present

## 2022-11-14 DIAGNOSIS — K5730 Diverticulosis of large intestine without perforation or abscess without bleeding: Secondary | ICD-10-CM | POA: Diagnosis not present

## 2022-11-14 DIAGNOSIS — K581 Irritable bowel syndrome with constipation: Secondary | ICD-10-CM | POA: Diagnosis not present

## 2022-11-14 DIAGNOSIS — Z860100 Personal history of colon polyps, unspecified: Secondary | ICD-10-CM | POA: Insufficient documentation

## 2022-11-14 DIAGNOSIS — K589 Irritable bowel syndrome without diarrhea: Secondary | ICD-10-CM | POA: Diagnosis not present

## 2022-11-14 MED ORDER — IOHEXOL 300 MG/ML  SOLN
100.0000 mL | Freq: Once | INTRAMUSCULAR | Status: AC | PRN
  Administered 2022-11-14: 100 mL via INTRAVENOUS

## 2022-11-14 NOTE — Telephone Encounter (Signed)
Inbound call from patient stating that she was seen on 10/17 with Dr. Chales Abrahams. Patient stated that she had CT scan done and when she came in on 10/17 Dr. Chales Abrahams told her that he would be calling this afternoon to discuss the results. Patient is requesting this message be sent as urgent as she is expecting a call this afternoon. Please advise.

## 2022-11-14 NOTE — Telephone Encounter (Signed)
Spoke to pt. Pt stated that Dr. Chales Abrahams stated that he would call her this evening with the results of CT scan.  Please review and advise

## 2022-11-18 ENCOUNTER — Encounter: Payer: Self-pay | Admitting: Allergy

## 2022-11-18 ENCOUNTER — Ambulatory Visit: Payer: BC Managed Care – PPO | Admitting: Allergy

## 2022-11-18 VITALS — BP 110/70 | HR 78 | Resp 16 | Ht 61.5 in | Wt 150.8 lb

## 2022-11-18 DIAGNOSIS — J329 Chronic sinusitis, unspecified: Secondary | ICD-10-CM | POA: Diagnosis not present

## 2022-11-18 DIAGNOSIS — J45990 Exercise induced bronchospasm: Secondary | ICD-10-CM | POA: Diagnosis not present

## 2022-11-18 MED ORDER — XHANCE 93 MCG/ACT NA EXHU: 2.0000 | INHALANT_SUSPENSION | Freq: Two times a day (BID) | NASAL | 5 refills | Status: DC

## 2022-11-18 NOTE — Progress Notes (Signed)
Sinus CT scan approved by [facility] # [id number] Exp: 12-17-2022   Appt at [facility] 11/21/2022 arriving at 9:15 for a 9:45 appointment.   Jasmine Hahn is aware of this appt information.

## 2022-11-18 NOTE — Patient Instructions (Addendum)
Chronic Rhinosinusitis Frequent sinus infections requiring antibiotics approximately every two weeks. Symptoms include facial swelling, headaches, and postnasal drainage. Currently on montelukast and Flonase with little relief. -Order sinus CT to assess for structural abnormalities contributing to chronic sinusitis. -Check immune system with lab work to assess if contributing to frequent infections.  Will also obtain environmental allergy panel. -Consider prophylactic azithromycin if sinus symptoms persist despite optimized therapy. -Continue montelukast for now.  -Try Xyzal 5mg  1 tab daily.  This is an antihistamine to replace your current antihistamine -Start Xhance 2 sprays each nostril twice a day for sinus inflammation control.  If not covered by insurance then will have prescribe budesonide vials to perform steroid nasal saline rinses for treatment (see below)  Exercise driven bronchospasm -Have access to albuterol inhaler 2 puffs every 4-6 hours as needed for cough/wheeze/shortness of breath/chest tightness.  May use 15-20 minutes prior to activity.   Monitor frequency of use.     Follow-up in 3-4 months or sooner if needed  Budesonide (Pulmicort) + Saline Irrigation/Rinse  Budesonide (Pulmicort) is an anti-inflammatory steroid medication used to decrease nasal and sinus inflammation. It is dispensed in liquid form in a vial. Although it is manufactured for use with a nebulizer, we intend for you to use it with the NeilMed Sinus Rinse bottle (preferred) or a Neti pot.   Instructions:  1) Make 240cc of saline in the NeilMed bottle using the salt packets or your own saline recipe (see separate handout).  2) Add the entire 2cc vial of liquid Budesonide (Pulmicort) to the rinse bottle and mix together.  3) While in the shower or over the sink, tilt your head forward to a comfortable level. Put the tip of the sinus rinse bottle in your nostril and aim it towards the crown or top of your head.  Gently squeeze the bottle to flush out your nose. The fluid will circulate in and out of your sinus cavities, coming back out from either nostril or through your mouth. Try not to swallow large quantities and spit it out instead.  4) Perform Budesonide (Pulmicort) + Saline irrigations 2 times daily.

## 2022-11-18 NOTE — Progress Notes (Signed)
New Patient Note  RE: Jasmine Hahn MRN: [id number] DOB: [birth year] Date of Office Visit: 11/18/2022  Primary care provider: Lucianne Lei, MD  Chief Complaint: sinus infections  History of present illness: Jasmine Hahn is a 62 y.o. female presenting today for evaluation of chronic sinusitis.  Discussed the use of AI scribe software for clinical note transcription with the patient, who gave verbal consent to proceed.  She presents with recurrent sinus infections requiring frequent antibiotic courses. She reports symptoms of facial swelling, headaches, and postnasal drainage, which she attempts to manage with over-the-counter sinus medication (decongestants, mucinex) and Singulair and flonase use.  The decongestants do help with her headache relief. However, she notes minimal improvement with these interventions. The patient estimates a brief two-week respite of symptoms post-antibiotic treatment before the recurrence of the sinus infection.  The patient has been on various antibiotics for her sinus infections, including azithromycin and doxycycline. She reports that penicillins have been ineffective in treating her symptoms. She also mentions a negative reaction to oral prednisone, leading to significant swelling, but tolerates steroid injections well.  She has tried nasal rinses in the past but did not find them helpful.  The patient also reports a history of smoking, although she has quit for the past ten years. 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 She uses albuterol as needed for these symptoms.  She has seen a pulmonologist, Dr Judeth Horn in the past.   She has had allergy testing about 30 or years ago, which revealed allergies to cats, dogs, and pollen that she recalls.  She also mentions an upcoming stress  test. She has not had a pneumonia vaccine but receives annual flu shots. She takes a variety of vitamins to support her immune system due to her lupus diagnosis.      Review of systems: 10pt ROS negative  All other systems negative unless noted above in HPI  Past medical history: Past Medical History:  Diagnosis Date   Adenomatous colon polyp    Aftercare following right knee joint replacement surgery 09/01/2017   ALLERGIC RHINITIS, SEASONAL 06/26/2006   Qualifier: Diagnosis of  By: Drue Novel MD, Nolon Rod.    Allergy    Angina pectoris (HCC) 07/03/2021   Arthritis    Asthma    Atypical chest pain 04/27/2018   CAD (coronary artery disease) 08/05/2021   Cervical cancer (HCC)    Chronic constipation 07/13/2019   COVID-19 02/25/2019   Discoid lupus    Diverticulosis    Dyspnea on exertion 07/03/2021   FATIGUE 08/23/2007   Qualifier: Diagnosis of  By: Janit Bern     Former tobacco use 07/29/2017   Frontal headache 01/31/2021   GERD 06/26/2006   Qualifier: Diagnosis of  By: Drue Novel MD, Nolon Rod.    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    Syncope    not sure why   Thyroid disease    hypo   URI 08/23/2007   Qualifier: Diagnosis of   By: Janit Bern      Replacing diagnoses that were inactivated after the 04/21/22 regulatory import     Wears  partial dentures     Past surgical history: Past Surgical History:  Procedure Laterality Date   ABDOMINAL HYSTERECTOMY     bone spurs     feet-both feet   CHOLECYSTECTOMY     COLONOSCOPY  08/11/2019   Dr Kinnie Scales. No neoplasia. Diverticulosis of colon   COLONOSCOPY  06/02/2014   Colon polyp status post polypectomy. Internal hemorrhoids   ELBOW SURGERY     Dr Zachary George   ESOPHAGOGASTRODUODENOSCOPY  02/2011   Medoff, NEG   ESOPHAGOGASTRODUODENOSCOPY  08/11/2019   Dr Kinnie Scales. Normal EGD examination   ESOPHAGOGASTRODUODENOSCOPY  06/02/2014   Mid gastritis. Otherwise normal EGD   KNEE ARTHROSCOPY WITH DRILLING/MICROFRACTURE Right 05/25/2014   Procedure: KNEE ARTHROSCOPY WITH MICROFRACTURE PATELLA FEMORAL CONDYLE;  Surgeon: Mckinley Jewel, MD;  Location: [facility];  Service: Orthopedics;  Laterality: Right;   KNEE ARTHROSCOPY WITH MEDIAL MENISECTOMY Right 05/25/2014   Procedure: RIGHT KNEE ARTHROSCOPY WITH PARTIAL MEDIAL;  Surgeon: Mckinley Jewel, MD;  Location: [facility];  Service: Orthopedics;  Laterality: Right;   LEFT HEART CATH AND CORONARY ANGIOGRAPHY N/A 07/05/2021   Procedure: LEFT HEART CATH AND CORONARY ANGIOGRAPHY;  Surgeon: Lennette Bihari, MD;  Location: [facility];  Service: Cardiovascular;  Laterality: N/A;   LYSIS OF ADHESION Right 05/25/2014   Procedure: LYSIS OF ADHESION;  Surgeon: Mckinley Jewel, MD;  Location: [facility];  Service: Orthopedics;  Laterality: Right;   MOHS SURGERY     OOPHORECTOMY     PARTIAL KNEE ARTHROPLASTY Right 04/17/2016   Procedure: RIGHT KNEE UNICOMPARTMENTAL ARTHROPLASTY;  Surgeon: Loreta Ave, MD;  Location: [facility];  Service: Orthopedics;  Laterality: Right;   REPLACEMENT TOTAL KNEE Right    Skin removed on L leg     THYROIDECTOMY     x 2 surgeries - partial first time   TUBAL LIGATION      Family history:  Family History  Problem Relation Age of Onset   Stroke  Mother    Hypertension Mother    Diabetes Mother    Heart disease Mother    Hyperlipidemia Mother    Hypothyroidism Mother    Colon polyps Mother    Heart attack Mother    Esophageal cancer Father    Lung cancer Father    Throat cancer Father    Esophageal cancer Brother    Colon cancer Brother 8   Hypertension Brother    Hyperlipidemia Brother    Cancer - Colon Brother    Lung cancer Brother    Heart disease Maternal Grandmother    COPD Maternal Grandfather    Colon polyps Daughter    Rectal cancer Neg Hx    Stomach cancer Neg Hx     Social history: Lives in a home without carpeting with electric heating and heat pump cooling.  No pets in the home.  There is no concern for water damage, mildew or roaches in the home.  She is a Environmental manager. Tobacco Use   Smoking status: Former  Current packs/day: 0.00    Average packs/day: 0.5 packs/day for 30.0 years (15.0 ttl pk-yrs)    Types: Cigarettes    Start date: 05/20/1981    Quit date: 05/21/2011    Years since quitting: 11.5   Smokeless tobacco: Never  Vaping Use   Vaping status: Never Used    Medication List: Current Outpatient Medications  Medication Sig Dispense Refill   AMBULATORY NON FORMULARY MEDICATION Take 1 tablet by mouth daily. Liquid Silver Take 1 tablespoon daily     aspirin EC 81 MG tablet Take 1 tablet (81 mg total) by mouth daily. Swallow whole. 90 tablet 3   baclofen (LIORESAL) 10 MG tablet Take 10 mg by mouth 3 (three) times daily.     BIOTIN PO Take 10,000 mcg by mouth daily.     donepezil (ARICEPT) 10 MG tablet Take 10 mg by mouth at bedtime.     famotidine (PEPCID) 20 MG tablet Take 20 mg by mouth at bedtime.     Flax OIL Take 15 mLs by mouth daily. Flax seed oil/ Mix with Thrive by le-Val shake     fluticasone (FLONASE) 50 MCG/ACT nasal spray Place 1 spray into both nostrils in the morning, at noon, in the evening, and at bedtime.     levothyroxine (SYNTHROID) 125 MCG tablet Take 125 mcg by  mouth daily before breakfast.     linaclotide (LINZESS) 145 MCG CAPS capsule Take 1 capsule (145 mcg total) by mouth daily before breakfast. 90 capsule 3   metoprolol succinate (TOPROL-XL) 25 MG 24 hr tablet Take 1 tablet (25 mg total) by mouth daily. 90 tablet 2   montelukast (SINGULAIR) 10 MG tablet Take 10 mg by mouth at bedtime.     Multiple Vitamins-Minerals (CENTRUM SILVER ADULT 50+ PO) Take 1 tablet by mouth daily.     nitroGLYCERIN (NITROSTAT) 0.4 MG SL tablet Place 0.4 mg under the tongue every 5 (five) minutes as needed for chest pain.     pantoprazole (PROTONIX) 40 MG tablet Take 1 tablet (40 mg total) by mouth daily. 90 tablet 4   polyethylene glycol powder (GLYCOLAX/MIRALAX) 17 GM/SCOOP powder Take 17 g by mouth daily. Mix with Thrive     Quercetin 500 MG CAPS Take 500 mg by mouth daily. Plus     rosuvastatin (CRESTOR) 10 MG tablet Take 1 tablet by mouth at bedtime.     VENTOLIN HFA 108 (90 Base) MCG/ACT inhaler Inhale 1-2 puffs into the lungs every 4 (four) hours as needed for wheezing or shortness of breath.     vitamin B-12 (CYANOCOBALAMIN) 1000 MCG tablet Take 1,000 mcg by mouth daily.     VIVELLE-DOT 0.1 MG/24HR patch Place 1 patch onto the skin 2 (two) times a week.      zinc gluconate 50 MG tablet Take 50 mg by mouth at bedtime.     zolpidem (AMBIEN) 10 MG tablet Take 10 mg by mouth at bedtime.      No current facility-administered medications for this visit.    Known medication allergies: Allergies  Allergen Reactions   Dog Epithelium (Canis Lupus Familiaris) Other (See Comments)    Other reaction(s): Eye Swelling, Wheezing   Pollen Extract Other (See Comments) and Tinitus    Other reaction(s): Headache, Other   Uncaria Tomentosa (Cats Claw)     Other reaction(s): eye redness, eye swelling, respiratory distress   Prednisone Other (See Comments)    Causes patient to gain weight    Cat Hair Extract Other (See Comments)  Eye Redness, Eye Swelling, Respiratory  Distress      Physical examination: Blood pressure 110/70, pulse 78, resp. rate 16, height 5' 1.5" (1.562 m), weight 150 lb 12.8 oz (68.4 kg), SpO2 98%.  General: Alert, interactive, in no acute distress. HEENT: PERRLA, TMs pearly gray, turbinates moderately edematous without discharge, post-pharynx non erythematous. Neck: Supple without lymphadenopathy. Lungs: Clear to auscultation without wheezing, rhonchi or rales. {no increased work of breathing. CV: Normal S1, S2 without murmurs. Abdomen: Nondistended, nontender. Skin: Warm and dry, without lesions or rashes. Extremities:  No clubbing, cyanosis or edema. Neuro:   Grossly intact.  Diagnositics/Labs: None today  Assessment and plan:   Chronic Rhinosinusitis Frequent sinus infections requiring antibiotics approximately every two weeks. Symptoms include facial swelling, headaches, and postnasal drainage. Currently on montelukast and Flonase with little relief. -Order sinus CT to assess for structural abnormalities contributing to chronic sinusitis. -Check immune system with lab work to assess if contributing to frequent infections.  Will also obtain environmental allergy panel. -Consider prophylactic azithromycin if sinus symptoms persist despite optimized therapy. -Continue montelukast for now.  -Try Xyzal 5mg  1 tab daily.  This is an antihistamine to replace your current antihistamine -Start Xhance 2 sprays each nostril twice a day for sinus inflammation control.  If not covered by insurance then will have prescribe budesonide vials to perform steroid nasal saline rinses for treatment  Follow-up in 3-4 months or sooner if needed I appreciate the opportunity to take part in Maven's care. Please do not hesitate to contact me with questions.  Sincerely,   Margo Aye, MD Allergy/Immunology [facility]

## 2022-11-18 NOTE — Telephone Encounter (Signed)
Neg CT AP for acute abn I have called patient and discussed with her the results. Still with constipation despite Linzess.  Wants to try something else.     Plan: Start Motegrity 2mg  po every day #30, 2RF RG  Viviann Spare, please call it in and give her card/coupons

## 2022-11-19 ENCOUNTER — Telehealth: Payer: Self-pay

## 2022-11-19 ENCOUNTER — Other Ambulatory Visit (HOSPITAL_COMMUNITY): Payer: Self-pay

## 2022-11-19 ENCOUNTER — Other Ambulatory Visit: Payer: Self-pay

## 2022-11-19 ENCOUNTER — Ambulatory Visit: Payer: BC Managed Care – PPO | Attending: Cardiology

## 2022-11-19 ENCOUNTER — Telehealth: Payer: Self-pay | Admitting: Pharmacy Technician

## 2022-11-19 DIAGNOSIS — R0609 Other forms of dyspnea: Secondary | ICD-10-CM

## 2022-11-19 DIAGNOSIS — K581 Irritable bowel syndrome with constipation: Secondary | ICD-10-CM

## 2022-11-19 DIAGNOSIS — I2510 Atherosclerotic heart disease of native coronary artery without angina pectoris: Secondary | ICD-10-CM

## 2022-11-19 LAB — MYOCARDIAL PERFUSION IMAGING
Estimated workload: 7
Exercise duration (min): 5 min
Exercise duration (sec): 35 s
LV dias vol: 52 mL (ref 46–106)
LV sys vol: 20 mL
MPHR: 157 {beats}/min
Nuc Stress EF: 62 %
Peak HR: 142 {beats}/min
Percent HR: 90 %
Rest HR: 69 {beats}/min
Rest Nuclear Isotope Dose: 8 mCi
SDS: 1
SRS: 1
SSS: 2
Stress Nuclear Isotope Dose: 25.9 mCi
TID: 1.13

## 2022-11-19 MED ORDER — TECHNETIUM TC 99M TETROFOSMIN IV KIT
25.9000 | PACK | Freq: Once | INTRAVENOUS | Status: AC | PRN
  Administered 2022-11-19: 25.9 via INTRAVENOUS

## 2022-11-19 MED ORDER — TECHNETIUM TC 99M TETROFOSMIN IV KIT
8.0000 | PACK | Freq: Once | INTRAVENOUS | Status: AC | PRN
  Administered 2022-11-19: 8 via INTRAVENOUS

## 2022-11-19 MED ORDER — MOTEGRITY 2 MG PO TABS: 2.0000 mg | ORAL_TABLET | Freq: Every day | ORAL | 2 refills | Status: DC

## 2022-11-19 NOTE — Telephone Encounter (Signed)
Pharmacy Patient Advocate Encounter   Received notification from CoverMyMeds that prior authorization for MOTEGRITY 2MG  is required/requested.   Insurance verification completed.   The patient is insured through [facility] .   Per test claim: PA required; PA submitted to above mentioned insurance via CoverMyMeds Key/confirmation #/EOC [id number] Status is pending

## 2022-11-19 NOTE — Telephone Encounter (Signed)
Pharmacy Patient Advocate Encounter   Received notification from  [employer]PN  that prior authorization for [facility] MCG/ACT EXHU is required/requested.   Insurance verification completed.   The patient is insured through [facility] .   Per test claim: PA required; PA submitted to above mentioned insurance via ASPN Key/confirmation #/EOC [id number] Status is pending

## 2022-11-19 NOTE — Telephone Encounter (Signed)
Prescription sent to pharmacy Pt made aware Pt verbalized understanding with all questions answered.   

## 2022-11-19 NOTE — Addendum Note (Signed)
Addended by: Gypsy Balsam on: 11/19/2022 11:40 AM   Modules accepted: Orders

## 2022-11-20 NOTE — Telephone Encounter (Signed)
Received authorization approval for [employer]. Reference number: [id number], valid 11/19/2022 - 11/19/2023.

## 2022-11-21 NOTE — Telephone Encounter (Signed)
PA was denied. We can do a peer to peer as well by calling [phone] as well but she has to try trulance she hasn't and failed linzess only

## 2022-11-21 NOTE — Telephone Encounter (Signed)
Patient called to follow up on Motegrity medication that looks like is still pending insurance decision, Advised patient of information she will call her insurance to check status with them as well.

## 2022-11-21 NOTE — Telephone Encounter (Signed)
Inbound call from patient stating she did speak with insurance. They advised patient would have to try out 2 medications prior to the approval of Motegrity. Patient states she has already tried Linzess. Was advised the other option is Trulance that would also be covered by insurance. Patient is requesting to have Trulance sent to pharmacy instead. Patient requesting a follow up call. Please advise, thank you.

## 2022-11-24 ENCOUNTER — Ambulatory Visit: Payer: BC Managed Care – PPO | Admitting: Gastroenterology

## 2022-11-25 ENCOUNTER — Encounter: Payer: Self-pay | Admitting: Gastroenterology

## 2022-11-25 ENCOUNTER — Ambulatory Visit: Payer: BC Managed Care – PPO | Admitting: Allergy

## 2022-11-25 ENCOUNTER — Encounter: Payer: Self-pay | Admitting: Pulmonary Disease

## 2022-11-25 DIAGNOSIS — R0609 Other forms of dyspnea: Secondary | ICD-10-CM

## 2022-11-25 NOTE — Telephone Encounter (Signed)
Please see note from pt and phone note from 11/19/2022 Please review and advise

## 2022-11-25 NOTE — Telephone Encounter (Signed)
Please try Trulance 3mg  po every day x 1 month, 4RF RG

## 2022-11-26 DIAGNOSIS — G5603 Carpal tunnel syndrome, bilateral upper limbs: Secondary | ICD-10-CM | POA: Diagnosis not present

## 2022-11-26 DIAGNOSIS — M1812 Unilateral primary osteoarthritis of first carpometacarpal joint, left hand: Secondary | ICD-10-CM | POA: Diagnosis not present

## 2022-11-26 DIAGNOSIS — M1811 Unilateral primary osteoarthritis of first carpometacarpal joint, right hand: Secondary | ICD-10-CM | POA: Diagnosis not present

## 2022-11-26 DIAGNOSIS — M180 Bilateral primary osteoarthritis of first carpometacarpal joints: Secondary | ICD-10-CM | POA: Diagnosis not present

## 2022-11-26 MED ORDER — TRULANCE 3 MG PO TABS: 3.0000 mg | ORAL_TABLET | Freq: Every day | ORAL | 4 refills | Status: AC

## 2022-11-26 NOTE — Telephone Encounter (Signed)
Medication sent and patient is aware 

## 2022-11-27 ENCOUNTER — Other Ambulatory Visit: Payer: Self-pay

## 2022-11-27 DIAGNOSIS — N816 Rectocele: Secondary | ICD-10-CM | POA: Diagnosis not present

## 2022-11-27 DIAGNOSIS — N8110 Cystocele, unspecified: Secondary | ICD-10-CM | POA: Diagnosis not present

## 2022-11-27 NOTE — Telephone Encounter (Signed)
Please try Trulance 3mg  po every day x 1 month, 4RF RG

## 2022-11-27 NOTE — Telephone Encounter (Signed)
Refer back to phone note 10/30 & other patient message. Sent to prior auth team.

## 2022-11-28 DIAGNOSIS — J329 Chronic sinusitis, unspecified: Secondary | ICD-10-CM | POA: Diagnosis not present

## 2022-12-01 ENCOUNTER — Other Ambulatory Visit (HOSPITAL_COMMUNITY): Payer: Self-pay

## 2022-12-01 DIAGNOSIS — R413 Other amnesia: Secondary | ICD-10-CM | POA: Diagnosis not present

## 2022-12-01 NOTE — Telephone Encounter (Signed)
PA not needed-last filled 11/06

## 2022-12-02 ENCOUNTER — Telehealth: Payer: Self-pay

## 2022-12-02 NOTE — Telephone Encounter (Signed)
Pharmacy Patient Advocate Encounter  Received notification from [facility] that Prior Authorization for Motegrity 2mg  tablets has been DENIED.  Full denial letter will be uploaded to the media tab. See denial reason below.   PA #/Case ID/Reference #:  [id number]

## 2022-12-02 NOTE — Telephone Encounter (Signed)
 Left message on My Chart with normal Stress Test results per Dr. Vanetta Shawl note. Routed to PCP.

## 2022-12-04 DIAGNOSIS — N816 Rectocele: Secondary | ICD-10-CM | POA: Diagnosis not present

## 2022-12-04 DIAGNOSIS — N8110 Cystocele, unspecified: Secondary | ICD-10-CM | POA: Diagnosis not present

## 2022-12-09 ENCOUNTER — Telehealth (HOSPITAL_BASED_OUTPATIENT_CLINIC_OR_DEPARTMENT_OTHER): Payer: Self-pay | Admitting: Pulmonary Disease

## 2022-12-09 ENCOUNTER — Ambulatory Visit (HOSPITAL_BASED_OUTPATIENT_CLINIC_OR_DEPARTMENT_OTHER): Payer: BC Managed Care – PPO | Admitting: Pulmonary Disease

## 2022-12-09 DIAGNOSIS — R0609 Other forms of dyspnea: Secondary | ICD-10-CM | POA: Diagnosis not present

## 2022-12-09 LAB — PULMONARY FUNCTION TEST
DL/VA % pred: 115 %
DL/VA: 4.97 ml/min/mmHg/L
DLCO cor % pred: 112 %
DLCO cor: 20.35 ml/min/mmHg
DLCO unc % pred: 115 %
DLCO unc: 20.83 ml/min/mmHg
FEF 25-75 Post: 2.42 L/s
FEF 25-75 Pre: 1.82 L/s
FEF2575-%Change-Post: 33 %
FEF2575-%Pred-Post: 118 %
FEF2575-%Pred-Pre: 88 %
FEV1-%Change-Post: 6 %
FEV1-%Pred-Post: 93 %
FEV1-%Pred-Pre: 88 %
FEV1-Post: 2.06 L
FEV1-Pre: 1.94 L
FEV1FVC-%Change-Post: 4 %
FEV1FVC-%Pred-Pre: 102 %
FEV6-%Change-Post: 2 %
FEV6-%Pred-Post: 90 %
FEV6-%Pred-Pre: 88 %
FEV6-Post: 2.49 L
FEV6-Pre: 2.43 L
FEV6FVC-%Pred-Post: 104 %
FEV6FVC-%Pred-Pre: 104 %
FVC-%Change-Post: 2 %
FVC-%Pred-Post: 86 %
FVC-%Pred-Pre: 84 %
FVC-Post: 2.49 L
FVC-Pre: 2.43 L
Post FEV1/FVC ratio: 83 %
Post FEV6/FVC ratio: 100 %
Pre FEV1/FVC ratio: 80 %
Pre FEV6/FVC Ratio: 100 %
RV % pred: 105 %
RV: 2 L
TLC % pred: 104 %
TLC: 4.83 L

## 2022-12-09 NOTE — Progress Notes (Signed)
Full PFT Performed Today  

## 2022-12-09 NOTE — Patient Instructions (Signed)
Full PFT Performed Today  

## 2022-12-11 ENCOUNTER — Other Ambulatory Visit (HOSPITAL_COMMUNITY): Payer: Self-pay

## 2022-12-15 NOTE — Telephone Encounter (Signed)
OK with me but I do not have availability until next year. Please recommend to patient to keep appointment with Dr. Judeth Horn and can schedule with me in the future.

## 2022-12-15 NOTE — Telephone Encounter (Signed)
Pt using google voice for phone , was unable to reach. Will try again later

## 2022-12-15 NOTE — Telephone Encounter (Signed)
Is this switch okay?

## 2022-12-16 DIAGNOSIS — N993 Prolapse of vaginal vault after hysterectomy: Secondary | ICD-10-CM | POA: Diagnosis not present

## 2022-12-16 DIAGNOSIS — N8110 Cystocele, unspecified: Secondary | ICD-10-CM | POA: Diagnosis not present

## 2022-12-16 DIAGNOSIS — N816 Rectocele: Secondary | ICD-10-CM | POA: Diagnosis not present

## 2022-12-16 NOTE — Telephone Encounter (Signed)
Ok with me. thanks

## 2022-12-19 ENCOUNTER — Encounter (HOSPITAL_COMMUNITY): Payer: Self-pay | Admitting: Obstetrics

## 2022-12-19 ENCOUNTER — Inpatient Hospital Stay (HOSPITAL_COMMUNITY)
Admission: AD | Admit: 2022-12-19 | Discharge: 2022-12-19 | Disposition: A | Discharge status: Home / Self Care | Payer: BC Managed Care – PPO | Attending: Obstetrics | Admitting: Obstetrics

## 2022-12-19 DIAGNOSIS — R339 Retention of urine, unspecified: Secondary | ICD-10-CM | POA: Diagnosis not present

## 2022-12-19 MED ORDER — NITROFURANTOIN MONOHYD MACRO 100 MG PO CAPS: 100.0000 mg | ORAL_CAPSULE | Freq: Two times a day (BID) | ORAL | 0 refills | Status: DC

## 2022-12-19 NOTE — MAU Note (Signed)
Pt to rm via WC. Had A&P surgery on 11/26. Was seen in the office 11/27, foley was replaced.  Pt was to remove the foley and come to MAU 4-6 hrs later.   Assisted pt onto bed. Scant (nickel sized lt red spot noted on pad)  pt very uncomfortable with movement, requires assistance. Bladder scan on arrival was 131cc. Pt states feels like she needs to go.  Pt on bedpan, to call out when finished.

## 2022-12-19 NOTE — MAU Note (Signed)
Pt voided approximately 30-40cc and post void bladder scan was 527cc

## 2022-12-19 NOTE — MAU Note (Signed)
Report received. Per triage RN pt is currently on her bedpan with husband at bedside. Will call out when finished.

## 2022-12-19 NOTE — Progress Notes (Signed)
62 yo S/P A&P repair with Dr Rana Snare 12/16/22. She had PO urinary retention and was discharged with foley cath to SD and po nitrofurantoin. She denies fever. She is passing flatus. No BM yet. Has a Hx of constipation and takes Mirilax and Dulcolax regularly. Has only eaten a soft diet for fear of painful first BM.   Today's Vitals   12/19/22 1127  BP: 134/80  Pulse: 84  Resp: 18  Temp: 98.4 F (36.9 C)  TempSrc: Oral  SpO2: 96%   There is no height or weight on file to calculate BMI.   Cath removed. She voids 30-40 ml and on PV bladder scan has 527 ml.  A/P: D/W patient and husband above and need to rest the bladder.         Will replace foley cath.         Nitrofurantoin until removed         FU in office on Monday , 3 days, for another bladder trial         She is encouraged to eat a regular diet. Will take her usual routine of laxatives. If no BM by tomorrow will use MOM.         All questions answered. She states she understands

## 2022-12-19 NOTE — MAU Provider Note (Signed)
S Ms. Jasmine Hahn is a 62 y.o. No obstetric history on file. patient who presents to MAU today with complaint of urinary retention. Recently had GYN surgery, after which pt had urinary retention requiring foley catheter. Pt was instructed by clinic to return to MAU after voiding for bladder scan today. No other complaints   O BP 134/80 (BP Location: Right Arm)   Pulse 84   Temp 98.4 F (36.9 C) (Oral)   Resp 18   SpO2 96%   NAD, well appearing Breathing comfortably Regular HR  A/P Medical screening exam complete  Dr Henderson Cloud to come see pt for further assessment  Sundra Aland, MD 12/19/2022 11:42 AM

## 2022-12-23 DIAGNOSIS — Z4889 Encounter for other specified surgical aftercare: Secondary | ICD-10-CM | POA: Diagnosis not present

## 2022-12-26 DIAGNOSIS — R338 Other retention of urine: Secondary | ICD-10-CM | POA: Diagnosis not present

## 2023-01-05 DIAGNOSIS — R41840 Attention and concentration deficit: Secondary | ICD-10-CM | POA: Diagnosis not present

## 2023-01-05 DIAGNOSIS — F4323 Adjustment disorder with mixed anxiety and depressed mood: Secondary | ICD-10-CM | POA: Diagnosis not present

## 2023-01-09 ENCOUNTER — Ambulatory Visit: Payer: BC Managed Care – PPO | Admitting: Pulmonary Disease

## 2023-01-12 ENCOUNTER — Encounter: Payer: Self-pay | Admitting: *Deleted

## 2023-01-16 DIAGNOSIS — K5909 Other constipation: Secondary | ICD-10-CM | POA: Diagnosis not present

## 2023-01-16 DIAGNOSIS — R1084 Generalized abdominal pain: Secondary | ICD-10-CM | POA: Diagnosis not present

## 2023-01-16 DIAGNOSIS — K219 Gastro-esophageal reflux disease without esophagitis: Secondary | ICD-10-CM | POA: Diagnosis not present

## 2023-01-16 DIAGNOSIS — Z800 Family history of malignant neoplasm of digestive organs: Secondary | ICD-10-CM | POA: Diagnosis not present

## 2023-01-19 DIAGNOSIS — L308 Other specified dermatitis: Secondary | ICD-10-CM | POA: Diagnosis not present

## 2023-01-19 DIAGNOSIS — D485 Neoplasm of uncertain behavior of skin: Secondary | ICD-10-CM | POA: Diagnosis not present

## 2023-01-19 DIAGNOSIS — Z08 Encounter for follow-up examination after completed treatment for malignant neoplasm: Secondary | ICD-10-CM | POA: Diagnosis not present

## 2023-01-19 DIAGNOSIS — C44719 Basal cell carcinoma of skin of left lower limb, including hip: Secondary | ICD-10-CM | POA: Diagnosis not present

## 2023-01-19 DIAGNOSIS — R208 Other disturbances of skin sensation: Secondary | ICD-10-CM | POA: Diagnosis not present

## 2023-01-19 DIAGNOSIS — L578 Other skin changes due to chronic exposure to nonionizing radiation: Secondary | ICD-10-CM | POA: Diagnosis not present

## 2023-01-19 DIAGNOSIS — L570 Actinic keratosis: Secondary | ICD-10-CM | POA: Diagnosis not present

## 2023-01-19 DIAGNOSIS — L820 Inflamed seborrheic keratosis: Secondary | ICD-10-CM | POA: Diagnosis not present

## 2023-01-19 DIAGNOSIS — C44712 Basal cell carcinoma of skin of right lower limb, including hip: Secondary | ICD-10-CM | POA: Diagnosis not present

## 2023-01-19 DIAGNOSIS — L538 Other specified erythematous conditions: Secondary | ICD-10-CM | POA: Diagnosis not present

## 2023-01-20 DIAGNOSIS — R41840 Attention and concentration deficit: Secondary | ICD-10-CM | POA: Diagnosis not present

## 2023-01-23 DIAGNOSIS — A499 Bacterial infection, unspecified: Secondary | ICD-10-CM | POA: Diagnosis not present

## 2023-02-20 DIAGNOSIS — J329 Chronic sinusitis, unspecified: Secondary | ICD-10-CM | POA: Diagnosis not present

## 2023-02-24 ENCOUNTER — Ambulatory Visit: Payer: BC Managed Care – PPO | Admitting: Allergy

## 2023-02-24 ENCOUNTER — Encounter: Payer: Self-pay | Admitting: Allergy

## 2023-02-24 VITALS — BP 118/76 | HR 72 | Resp 16

## 2023-02-24 DIAGNOSIS — J45990 Exercise induced bronchospasm: Secondary | ICD-10-CM | POA: Diagnosis not present

## 2023-02-24 DIAGNOSIS — J329 Chronic sinusitis, unspecified: Secondary | ICD-10-CM

## 2023-02-24 MED ORDER — AZITHROMYCIN 250 MG PO TABS: ORAL_TABLET | ORAL | 0 refills | Status: DC

## 2023-02-24 MED ORDER — BUDESONIDE 0.5 MG/2ML IN SUSP: RESPIRATORY_TRACT | 5 refills | Status: AC

## 2023-02-24 MED ORDER — AZITHROMYCIN 500 MG PO TABS: ORAL_TABLET | ORAL | 0 refills | Status: DC

## 2023-02-24 MED ORDER — METHYLPREDNISOLONE ACETATE 80 MG/ML IJ SUSP
80.0000 mg | Freq: Once | INTRAMUSCULAR | Status: AC
  Administered 2023-02-24: 80 mg via INTRAMUSCULAR

## 2023-02-24 NOTE — Progress Notes (Signed)
 Follow-up Note  RE: Jasmine Hahn MRN: [id number] DOB: Feb 14, 1960 Date of Office Visit: 02/24/2023   History of present illness: Jasmine Hahn is a 63 y.o. female presenting today for follow-up of chronic rhinosinusitis.  She presents today with her significant other.  She was last seen in the office on 11/18/2022 by myself. Discussed the use of AI scribe software for clinical note transcription with the patient, who gave verbal consent to proceed.  For the past three weeks, she has experienced persistent symptoms of a sinus infection, including severe headaches, green nasal discharge, and significant facial pain. The intensity of the pain has impacted her ability to work, necessitating sick days due to the headaches.  She has been using Xhance  nasal spray and Xyzal (levocetirizine) daily without noticeable improvement. She feels the spray when using it go up into her sinuses, but it has not alleviated her sinus issues. Saline rinses are used to manage dryness and discomfort, which she finds somewhat helpful in clearing nasal discharge. In early January, she was prescribed Augmentin by a telehealth provider, but she did not experience significant relief. She has a history of requiring multiple courses of antibiotics to manage sinus infections.  She experiences frequent headaches, often waking up with them, and describes her ears as feeling full and painful, particularly on the right side. Her nose and cheek become red and swollen during severe sinus infections.  She uses a rescue inhaler before physical activities like dance classes, which helps her manage symptoms during exercise. She takes Sudafed twice daily to manage sinus pressure and pain, noting that missing a dose results in increased symptoms.  She has a history of rectocele, for which she underwent surgery in November, resulting in significant pain and medical leave. The post-surgical pain was severe, particularly in the  first fifteen days after the procedure.      Review of systems: 10pt ROS negative unless noted above in HPI   All other systems negative unless noted above in HPI  Past medical/social/surgical/family history have been reviewed and are unchanged unless specifically indicated below.  No changes  Medication List: Current Outpatient Medications  Medication Sig Dispense Refill   aspirin  EC 81 MG tablet Take 1 tablet (81 mg total) by mouth daily. Swallow whole. 90 tablet 3   baclofen (LIORESAL) 10 MG tablet Take 10 mg by mouth 3 (three) times daily.     BIOTIN PO Take 10,000 mcg by mouth daily.     budesonide  (PULMICORT ) 0.5 MG/2ML nebulizer solution Perform nasal rinse 2 times daily 120 mL 5   donepezil (ARICEPT) 10 MG tablet Take 10 mg by mouth at bedtime.     famotidine  (PEPCID ) 20 MG tablet Take 20 mg by mouth at bedtime.     Flax OIL Take 15 mLs by mouth daily. Flax seed oil/ Mix with Thrive by le-Val shake     Fluticasone  Propionate (XHANCE ) 93 MCG/ACT EXHU Place 2 sprays into the nose 2 (two) times daily. 32 mL 5   levothyroxine  (SYNTHROID ) 125 MCG tablet Take 125 mcg by mouth daily before breakfast.     linaclotide  (LINZESS ) 145 MCG CAPS capsule Take 1 capsule (145 mcg total) by mouth daily before breakfast. 90 capsule 3   methocarbamol  (ROBAXIN ) 500 MG tablet Take 500 mg by mouth 4 (four) times daily.     metoprolol  succinate (TOPROL -XL) 25 MG 24 hr tablet Take 1 tablet (25 mg total) by mouth daily. 90 tablet 2   montelukast (SINGULAIR) 10 MG  tablet Take 10 mg by mouth at bedtime.     Multiple Vitamins-Minerals (CENTRUM SILVER ADULT 50+ PO) Take 1 tablet by mouth daily.     nitrofurantoin , macrocrystal-monohydrate, (MACROBID ) 100 MG capsule Take 100 mg by mouth 2 (two) times daily.     nitrofurantoin , macrocrystal-monohydrate, (MACROBID ) 100 MG capsule Take 1 capsule (100 mg total) by mouth 2 (two) times daily. 10 capsule 0   nitroGLYCERIN  (NITROSTAT ) 0.4 MG SL tablet Place 0.4 mg  under the tongue every 5 (five) minutes as needed for chest pain.     oxyCODONE -acetaminophen  (PERCOCET/ROXICET) 5-325 MG tablet Take by mouth every 4 (four) hours as needed for severe pain (pain score 7-10).     pantoprazole  (PROTONIX ) 40 MG tablet Take 1 tablet (40 mg total) by mouth daily. 90 tablet 4   Plecanatide  (TRULANCE ) 3 MG TABS Take 1 tablet (3 mg total) by mouth daily. 30 tablet 4   polyethylene glycol powder (GLYCOLAX/MIRALAX) 17 GM/SCOOP powder Take 17 g by mouth daily. Mix with Thrive     Prucalopride Succinate  (MOTEGRITY ) 2 MG TABS Take 1 tablet (2 mg total) by mouth daily. 30 tablet 2   Quercetin 500 MG CAPS Take 500 mg by mouth daily. Plus     rosuvastatin (CRESTOR) 10 MG tablet Take 1 tablet by mouth at bedtime.     vitamin B-12 (CYANOCOBALAMIN ) 1000 MCG tablet Take 1,000 mcg by mouth daily.     VIVELLE-DOT 0.1 MG/24HR patch Place 1 patch onto the skin 2 (two) times a week.      zinc gluconate 50 MG tablet Take 50 mg by mouth at bedtime.     zolpidem  (AMBIEN ) 10 MG tablet Take 10 mg by mouth at bedtime.      No current facility-administered medications for this visit.     Known medication allergies: Allergies  Allergen Reactions   Dog Epithelium (Canis Lupus Familiaris) Other (See Comments)    Other reaction(s): Eye Swelling, Wheezing   Pollen Extract Other (See Comments) and Tinitus    Other reaction(s): Headache, Other   Uncaria Tomentosa (Cats Claw)     Other reaction(s): eye redness, eye swelling, respiratory distress   Prednisone  Other (See Comments)    Causes patient to gain weight    Cat Dander Other (See Comments)    Eye Redness, Eye Swelling, Respiratory Distress      Physical examination: Blood pressure 118/76, pulse 72, resp. rate 16, SpO2 96%.  General: Alert, interactive, in no acute distress. HEENT: PERRLA, TMs pearly gray, turbinates moderately edematous with crusty discharge, post-pharynx non erythematous. Neck: Supple without  lymphadenopathy. Lungs: Clear to auscultation without wheezing, rhonchi or rales. {no increased work of breathing. CV: Normal S1, S2 without murmurs. Abdomen: Nondistended, nontender. Skin: Warm and dry, without lesions or rashes. Extremities:  No clubbing, cyanosis or edema. Neuro:   Grossly intact.  Diagnositics/Labs: Labs:  Component     Latest Ref Rng 02/20/2023  IgE (Immunoglobulin E), Serum     6 - 495 IU/mL 25   D Pteronyssinus IgE     Class 0 kU/L <0.10   D Farinae IgE     Class 0 kU/L <0.10   Cat Dander IgE     Class 0 kU/L <0.10   Dog Dander IgE     Class 0 kU/L <0.10   Bermuda Grass IgE     Class 0 kU/L <0.10   Timothy Grass IgE     Class 0 kU/L <0.10   Johnson Grass IgE  Class 0 kU/L <0.10   Cockroach, German IgE     Class 0 kU/L <0.10   Penicillium Chrysogen IgE     Class 0 kU/L <0.10   Cladosporium Herbarum IgE     Class 0 kU/L <0.10   Aspergillus Fumigatus IgE     Class 0 kU/L <0.10   Alternaria Alternata IgE     Class 0 kU/L <0.10   Maple/Box Elder IgE     Class 0 kU/L <0.10   Common Silver Valrie IgE     Class 0 kU/L <0.10   Cedar, [place] IgE     Class 0 kU/L <0.10   Oak, White IgE     Class 0 kU/L <0.10   Elm, American IgE     Class 0 kU/L <0.10   Cottonwood IgE     Class 0 kU/L <0.10   Pecan, Hickory IgE     Class 0 kU/L <0.10   White Mulberry IgE     Class 0 kU/L <0.10   Ragweed, Short IgE     Class 0 kU/L <0.10   Pigweed, Rough IgE     Class 0 kU/L <0.10   Sheep Sorrel IgE Qn     Class 0 kU/L <0.10   Mouse Urine IgE     Class 0 kU/L <0.10    Component     Latest Ref Rng 02/20/2023  IgG (Immunoglobin G), Serum     586 - 1,602 mg/dL 8,727   IgA/Immunoglobulin A, Serum     87 - 352 mg/dL 651   IgM (Immunoglobulin M), Srm     26 - 217 mg/dL 91    Component     Latest Ref Rng 02/20/2023  Compl, Total (CH50)     >41 U/mL 57    Depo-Medrol  80 injection given today in office  Assessment and plan:   Chronic  Rhinosinusitis Frequent sinus infections requiring antibiotics approximately every two weeks. Symptoms include facial swelling, headaches, and postnasal drainage. Currently on montelukast and Flonase  with little relief. -Sinus CT did show chronic sinus changes but nothing acutely abnormal at time of imaging study -Your immune system screen thus far looks reassuring however vaccine titers to assess response are not back yet -Consider prophylactic Azithromycin  250mg  tab 3 times a week (Monday, Wednesday, Friday dosing).  Start this after you take Azithromycin  treatment dose for next 5 days.  -Changing Xhance  to Budesonide  saline rinse.  Perform twice a day.  -Steroid injection given in office today to help with current symptom control  Exercise driven bronchospasm -Have access to albuterol inhaler 2 puffs every 4-6 hours as needed for cough/wheeze/shortness of breath/chest tightness.  May use 15-20 minutes prior to activity.   Monitor frequency of use.     Follow-up in 3-4 months or sooner if needed  I appreciate the opportunity to take part in Elenna's care. Please do not hesitate to contact me with questions.  Sincerely,   Danita Brain, MD Allergy/Immunology [facility]

## 2023-02-24 NOTE — Patient Instructions (Addendum)
 Chronic Rhinosinusitis Frequent sinus infections requiring antibiotics approximately every two weeks. Symptoms include facial swelling, headaches, and postnasal drainage. Currently on montelukast and Flonase  with little relief. -Sinus CT did show chronic sinus changes but nothing acutely abnormal at time of imaging study -Your immune system screen thus far looks reassuring however vaccine titers to assess response are not back yet -Consider prophylactic Azithromycin  250mg  tab 3 times a week (Monday, Wednesday, Friday dosing).  Start this after you take Azithromycin  treatment dose for next 5 days.  -Changing Xhance  to Budesonide  saline rinse (see below).  Perform twice a day.  -Steroid injection given in office today to help with current symptom control  Exercise driven bronchospasm -Have access to albuterol inhaler 2 puffs every 4-6 hours as needed for cough/wheeze/shortness of breath/chest tightness.  May use 15-20 minutes prior to activity.   Monitor frequency of use.     Follow-up in 3-4 months or sooner if needed  Budesonide  (Pulmicort ) + Saline Irrigation/Rinse  Budesonide  (Pulmicort ) is an anti-inflammatory steroid medication used to decrease nasal and sinus inflammation. It is dispensed in liquid form in a vial. Although it is manufactured for use with a nebulizer, we intend for you to use it with the NeilMed Sinus Rinse bottle (preferred) or a Neti pot.   Instructions:  1) Make 240cc of saline in the NeilMed bottle using the salt packets or your own saline recipe (see separate handout).  2) Add the entire 2cc vial of liquid Budesonide  (Pulmicort ) to the rinse bottle and mix together.  3) While in the shower or over the sink, tilt your head forward to a comfortable level. Put the tip of the sinus rinse bottle in your nostril and aim it towards the crown or top of your head. Gently squeeze the bottle to flush out your nose. The fluid will circulate in and out of your sinus cavities, coming  back out from either nostril or through your mouth. Try not to swallow large quantities and spit it out instead.  4) Perform Budesonide  (Pulmicort ) + Saline irrigations 2 times daily.

## 2023-03-01 LAB — STREP PNEUMONIAE 23 SEROTYPES IGG
Pneumo Ab Type 1*: <0.1 ug/mL — ABNORMAL LOW (ref >1.3)
Pneumo Ab Type 12 (12F)*: <0.1 ug/mL — ABNORMAL LOW (ref >1.3)
Pneumo Ab Type 14*: <0.1 ug/mL — ABNORMAL LOW (ref >1.3)
Pneumo Ab Type 17 (17F)*: 2.4 ug/mL (ref >1.3)
Pneumo Ab Type 19 (19F)*: 0.8 ug/mL — ABNORMAL LOW (ref >1.3)
Pneumo Ab Type 2*: <0.2 ug/mL — ABNORMAL LOW (ref >1.3)
Pneumo Ab Type 20*: 2.6 ug/mL (ref >1.3)
Pneumo Ab Type 22 (22F)*: 0.1 ug/mL — ABNORMAL LOW (ref >1.3)
Pneumo Ab Type 23 (23F)*: 2.6 ug/mL (ref >1.3)
Pneumo Ab Type 26 (6B)*: <0.1 ug/mL — ABNORMAL LOW (ref >1.3)
Pneumo Ab Type 3*: <0.1 ug/mL — ABNORMAL LOW (ref >1.3)
Pneumo Ab Type 34 (10A)*: 0.8 ug/mL — ABNORMAL LOW (ref >1.3)
Pneumo Ab Type 4*: 0.4 ug/mL — ABNORMAL LOW (ref >1.3)
Pneumo Ab Type 43 (11A)*: <0.1 ug/mL — ABNORMAL LOW (ref >1.3)
Pneumo Ab Type 5*: 0.2 ug/mL — ABNORMAL LOW (ref >1.3)
Pneumo Ab Type 51 (7F)*: 0.2 ug/mL — ABNORMAL LOW (ref >1.3)
Pneumo Ab Type 54 (15B)*: 0.6 ug/mL — ABNORMAL LOW (ref >1.3)
Pneumo Ab Type 56 (18C)*: 0.5 ug/mL — ABNORMAL LOW (ref >1.3)
Pneumo Ab Type 57 (19A)*: 0.8 ug/mL — ABNORMAL LOW (ref >1.3)
Pneumo Ab Type 68 (9V)*: 0.9 ug/mL — ABNORMAL LOW (ref >1.3)
Pneumo Ab Type 70 (33F)*: 0.5 ug/mL — ABNORMAL LOW (ref >1.3)
Pneumo Ab Type 8*: <0.3 ug/mL — ABNORMAL LOW (ref >1.3)
Pneumo Ab Type 9 (9N)*: <0.1 ug/mL — ABNORMAL LOW (ref >1.3)

## 2023-03-01 LAB — IGG, IGA, IGM
IgA/Immunoglobulin A, Serum: 348 mg/dL (ref 87–352)
IgG (Immunoglobin G), Serum: 1272 mg/dL (ref 586–1602)
IgM (Immunoglobulin M), Srm: 91 mg/dL (ref 26–217)

## 2023-03-01 LAB — ALLERGENS W/TOTAL IGE AREA 2

## 2023-03-01 LAB — DIPHTHERIA / TETANUS ANTIBODY PANEL
Diphtheria Ab: 0.44 [IU]/mL (ref <0.10)
Tetanus Ab, IgG: 3.03 [IU]/mL (ref <0.10)

## 2023-03-01 LAB — COMPLEMENT, TOTAL: Compl, Total (CH50): 57 U/mL (ref >41)

## 2023-03-03 ENCOUNTER — Encounter: Payer: Self-pay | Admitting: Allergy

## 2023-03-03 ENCOUNTER — Other Ambulatory Visit: Payer: Self-pay | Admitting: *Deleted

## 2023-03-03 ENCOUNTER — Encounter: Payer: Self-pay | Admitting: *Deleted

## 2023-03-03 DIAGNOSIS — J329 Chronic sinusitis, unspecified: Secondary | ICD-10-CM

## 2023-03-17 DIAGNOSIS — R413 Other amnesia: Secondary | ICD-10-CM | POA: Diagnosis not present

## 2023-03-17 DIAGNOSIS — F909 Attention-deficit hyperactivity disorder, unspecified type: Secondary | ICD-10-CM | POA: Diagnosis not present

## 2023-04-16 DIAGNOSIS — I10 Essential (primary) hypertension: Secondary | ICD-10-CM | POA: Diagnosis not present

## 2023-04-16 DIAGNOSIS — K219 Gastro-esophageal reflux disease without esophagitis: Secondary | ICD-10-CM | POA: Diagnosis not present

## 2023-04-16 DIAGNOSIS — E785 Hyperlipidemia, unspecified: Secondary | ICD-10-CM | POA: Diagnosis not present

## 2023-04-16 DIAGNOSIS — E039 Hypothyroidism, unspecified: Secondary | ICD-10-CM | POA: Diagnosis not present

## 2023-04-16 DIAGNOSIS — Z79899 Other long term (current) drug therapy: Secondary | ICD-10-CM | POA: Diagnosis not present

## 2023-04-16 DIAGNOSIS — E559 Vitamin D deficiency, unspecified: Secondary | ICD-10-CM | POA: Diagnosis not present

## 2023-04-21 ENCOUNTER — Encounter: Payer: Self-pay | Admitting: Allergy

## 2023-04-21 ENCOUNTER — Ambulatory Visit: Admitting: Allergy

## 2023-04-21 VITALS — BP 130/78 | HR 76 | Resp 18

## 2023-04-21 DIAGNOSIS — R760 Raised antibody titer: Secondary | ICD-10-CM

## 2023-04-21 DIAGNOSIS — J45990 Exercise induced bronchospasm: Secondary | ICD-10-CM

## 2023-04-21 DIAGNOSIS — J329 Chronic sinusitis, unspecified: Secondary | ICD-10-CM | POA: Diagnosis not present

## 2023-04-21 NOTE — Patient Instructions (Addendum)
 Chronic Rhinosinusitis Frequent sinus infections requiring frequent antibiotics courses. Symptoms include facial swelling, headaches, congestion and postnasal drainage.  Has tried montelukast and Flonase with little relief as well as prophylactic antibiotic with azithromycin. -Will place ENT referral for either [facility] or [facility] -Your immune system screen thus far looks reassuring except for poor pneumococcal titers.  Recommend she receive the Pneumovax and she has discussed with her PCP who is in agreement.  He will arrange to have Pneumovax in our office where she can come back and receive this.  She has lab orders are ready for rechecking her titers in about 4 weeks at the soonest after getting Pneumovax. -Finish out Azithromycin course.  She has not had any benefit it seems from prophylactic antibiotic therapy. -Continue budesonide saline rinse twice a day (see below).  -Recommended after ENT referral if they do not have any other recommendations for symptom control then would have her schedule for skin testing for environmental allergens where she could then proceed to allergen immunotherapy.  Exercise driven bronchospasm -Have access to albuterol inhaler 2 puffs every 4-6 hours as needed for cough/wheeze/shortness of breath/chest tightness.  May use 15-20 minutes prior to activity.   Monitor frequency of use.     Follow-up in 3-4 months or sooner if needed  Budesonide (Pulmicort) + Saline Irrigation/Rinse  Budesonide (Pulmicort) is an anti-inflammatory steroid medication used to decrease nasal and sinus inflammation. It is dispensed in liquid form in a vial. Although it is manufactured for use with a nebulizer, we intend for you to use it with the NeilMed Sinus Rinse bottle (preferred) or a Neti pot.   Instructions:  1) Make 240cc of saline in the NeilMed bottle using the salt packets or your own saline recipe (see separate handout).  2) Add the entire 2cc vial of liquid Budesonide  (Pulmicort) to the rinse bottle and mix together.  3) While in the shower or over the sink, tilt your head forward to a comfortable level. Put the tip of the sinus rinse bottle in your nostril and aim it towards the crown or top of your head. Gently squeeze the bottle to flush out your nose. The fluid will circulate in and out of your sinus cavities, coming back out from either nostril or through your mouth. Try not to swallow large quantities and spit it out instead.  4) Perform Budesonide (Pulmicort) + Saline irrigations 2 times daily.

## 2023-04-21 NOTE — Progress Notes (Signed)
 Follow-up Note  RE: Jasmine Hahn MRN: [id number] DOB: [birth year] Date of Office Visit: 04/21/2023   History of present illness: Jasmine Hahn is a 63 y.o. female presenting today for follow-up of chronic rhinosinusitis with recurrent respiratory infections as well as exercise-induced bronchospasm.  She was last seen in the office on 02/24/2023 by myself. Discussed the use of AI scribe software for clinical note transcription with the patient, who gave verbal consent to proceed.  She experiences persistent sinus and ear symptoms, including fluid in the ears and severe ear pain, which have not improved despite various treatments.  She also continues to have very frequent headaches that are severe.  Daily sinus issues significantly impact her quality of life, and her symptoms have worsened over the past week. She uses nasal rinses with steroids twice a day but sometimes she has done a third rinse in the day, and prophylactic antibiotics with azithromycin 3 days a week, but these have not been very effective.  She does feel like she has had a tiny bit of improvement with use of the rinses but she still has significant sinus symptoms.  The prophylactic antibiotics does not seem to have helped much at all.  She has a long history of sinus issues and was advised to consider sinus surgery 25 years ago, which she declined at the time. A past CT scan showed no significant findings, and she has not consulted an ENT for her current issues.  She reports a history of positive allergy testing to pollens and other allergens however her recent blood allergy testing was negative.  She states her symptoms worsen with outdoor exposures.  Past skin testing showed allergies to cats, dogs, and trees on multiple occasions she states.  She has not yet received a pneumonia vaccine however she did discuss this with her PCP who recommended she receive the pneumonia vaccine.  She is now ready to get this vaccine  as she had poor protective titers to strep pneumoniae strains.   Review of systems: 10pt ROS negative unless noted above in HPI   Past medical/social/surgical/family history have been reviewed and are unchanged unless specifically indicated below.  No changes  Medication List: Current Outpatient Medications  Medication Sig Dispense Refill   aspirin EC 81 MG tablet Take 1 tablet (81 mg total) by mouth daily. Swallow whole. 90 tablet 3   azithromycin (ZITHROMAX) 250 MG tablet 2 tab (500mg ) day 1, then 1 tab (250 mg) day 2-5 6 each 0   azithromycin (ZITHROMAX) 500 MG tablet Take 1 tab 3 times a week (Mon, Wed, Fri) starting 03/02/23 for 3 months 40 tablet 0   baclofen (LIORESAL) 10 MG tablet Take 10 mg by mouth 3 (three) times daily.     BIOTIN PO Take 10,000 mcg by mouth daily.     budesonide (PULMICORT) 0.5 MG/2ML nebulizer solution Perform nasal rinse 2 times daily 120 mL 5   donepezil (ARICEPT) 10 MG tablet Take 10 mg by mouth at bedtime.     famotidine (PEPCID) 20 MG tablet Take 20 mg by mouth at bedtime.     Flax OIL Take 15 mLs by mouth daily. Flax seed oil/ Mix with Thrive by le-Val shake     Fluticasone Propionate (XHANCE) 93 MCG/ACT EXHU Place 2 sprays into the nose 2 (two) times daily. 32 mL 5   levothyroxine (SYNTHROID) 125 MCG tablet Take 125 mcg by mouth daily before breakfast.     linaclotide (LINZESS) 145 MCG CAPS capsule  Take 1 capsule (145 mcg total) by mouth daily before breakfast. 90 capsule 3   methocarbamol (ROBAXIN) 500 MG tablet Take 500 mg by mouth 4 (four) times daily.     metoprolol succinate (TOPROL-XL) 25 MG 24 hr tablet Take 1 tablet (25 mg total) by mouth daily. 90 tablet 2   montelukast (SINGULAIR) 10 MG tablet Take 10 mg by mouth at bedtime.     Multiple Vitamins-Minerals (CENTRUM SILVER ADULT 50+ PO) Take 1 tablet by mouth daily.     nitrofurantoin, macrocrystal-monohydrate, (MACROBID) 100 MG capsule Take 100 mg by mouth 2 (two) times daily.      nitrofurantoin, macrocrystal-monohydrate, (MACROBID) 100 MG capsule Take 1 capsule (100 mg total) by mouth 2 (two) times daily. 10 capsule 0   nitroGLYCERIN (NITROSTAT) 0.4 MG SL tablet Place 0.4 mg under the tongue every 5 (five) minutes as needed for chest pain.     oxyCODONE-acetaminophen (PERCOCET/ROXICET) 5-325 MG tablet Take by mouth every 4 (four) hours as needed for severe pain (pain score 7-10).     pantoprazole (PROTONIX) 40 MG tablet Take 1 tablet (40 mg total) by mouth daily. 90 tablet 4   Plecanatide (TRULANCE) 3 MG TABS Take 1 tablet (3 mg total) by mouth daily. 30 tablet 4   polyethylene glycol powder (GLYCOLAX/MIRALAX) 17 GM/SCOOP powder Take 17 g by mouth daily. Mix with Thrive     Prucalopride Succinate (MOTEGRITY) 2 MG TABS Take 1 tablet (2 mg total) by mouth daily. 30 tablet 2   Quercetin 500 MG CAPS Take 500 mg by mouth daily. Plus     rosuvastatin (CRESTOR) 10 MG tablet Take 1 tablet by mouth at bedtime.     vitamin B-12 (CYANOCOBALAMIN) 1000 MCG tablet Take 1,000 mcg by mouth daily.     VIVELLE-DOT 0.1 MG/24HR patch Place 1 patch onto the skin 2 (two) times a week.      zinc gluconate 50 MG tablet Take 50 mg by mouth at bedtime.     zolpidem (AMBIEN) 10 MG tablet Take 10 mg by mouth at bedtime.      No current facility-administered medications for this visit.     Known medication allergies: Allergies  Allergen Reactions   Dog Epithelium (Canis Lupus Familiaris) Other (See Comments)    Other reaction(s): Eye Swelling, Wheezing   Pollen Extract Other (See Comments) and Tinitus    Other reaction(s): Headache, Other   Uncaria Tomentosa (Cats Claw)     Other reaction(s): eye redness, eye swelling, respiratory distress   Prednisone Other (See Comments)    Causes patient to gain weight    Cat Dander Other (See Comments)    Eye Redness, Eye Swelling, Respiratory Distress      Physical examination: Blood pressure 130/78, pulse 76, resp. rate 18, SpO2 98%.  General:  Alert, interactive, in no acute distress. HEENT: PERRLA, TMs pearly gray, turbinates moderately edematous  erythematous mucosa , post-pharynx non erythematous. Neck: Supple without lymphadenopathy. Lungs: Clear to auscultation without wheezing, rhonchi or rales. {no increased work of breathing. CV: Normal S1, S2 without murmurs. Abdomen: Nondistended, nontender. Skin: Warm and dry, without lesions or rashes. Extremities:  No clubbing, cyanosis or edema. Neuro:   Grossly intact.  Diagnositics/Labs: None today  Assessment and plan: Chronic Rhinosinusitis Frequent sinus infections requiring frequent antibiotics courses. Symptoms include facial swelling, headaches, congestion and postnasal drainage.  Has tried montelukast and Flonase with little relief as well as prophylactic antibiotic with azithromycin. -Will place ENT referral for either [facility] or [facility] -Your immune system screen thus far looks reassuring except for poor pneumococcal titers.  Recommend she receive the Pneumovax and she has discussed with her PCP who is in agreement.  He will arrange to have Pneumovax in our office where she can come back and receive this.  She has lab orders are ready for rechecking her titers in about 4 weeks at the soonest after getting Pneumovax. -Finish out Azithromycin course.  She has not had any benefit it seems from prophylactic antibiotic therapy. -Continue budesonide saline rinse twice a day. -Recommended after ENT referral if they do not have any other recommendations for symptom control then would have her schedule for skin testing for environmental allergens where she could then proceed to allergen immunotherapy.  Exercise driven bronchospasm -Have access to albuterol inhaler 2 puffs every 4-6 hours as needed for cough/wheeze/shortness of breath/chest tightness.  May use 15-20 minutes prior to activity.   Monitor frequency of use.     Follow-up in 3-4 months or sooner if needed I appreciate  the opportunity to take part in Jasmine Hahn's care. Please do not hesitate to contact me with questions.  Sincerely,   Margo Aye, MD Allergy/Immunology [facility]

## 2023-05-01 ENCOUNTER — Other Ambulatory Visit: Payer: Self-pay | Admitting: Allergy

## 2023-05-01 NOTE — Telephone Encounter (Signed)
 Refill for Xhance 93 mcg x 1 with 5 refills sent to [facility] pharmacy.

## 2023-05-04 DIAGNOSIS — N6012 Diffuse cystic mastopathy of left breast: Secondary | ICD-10-CM | POA: Diagnosis not present

## 2023-05-04 NOTE — Progress Notes (Signed)
 Called regarding Pneumovax and referral was sent same day as OV.

## 2023-05-06 DIAGNOSIS — F5101 Primary insomnia: Secondary | ICD-10-CM | POA: Diagnosis not present

## 2023-05-06 DIAGNOSIS — F902 Attention-deficit hyperactivity disorder, combined type: Secondary | ICD-10-CM | POA: Insufficient documentation

## 2023-05-07 ENCOUNTER — Ambulatory Visit (INDEPENDENT_AMBULATORY_CARE_PROVIDER_SITE_OTHER): Admitting: *Deleted

## 2023-05-07 DIAGNOSIS — J329 Chronic sinusitis, unspecified: Secondary | ICD-10-CM

## 2023-05-07 DIAGNOSIS — Z23 Encounter for immunization: Secondary | ICD-10-CM | POA: Diagnosis not present

## 2023-05-12 ENCOUNTER — Encounter: Payer: Self-pay | Admitting: Cardiology

## 2023-05-12 ENCOUNTER — Ambulatory Visit: Payer: BC Managed Care – PPO | Attending: Cardiology | Admitting: Cardiology

## 2023-05-12 VITALS — BP 110/68 | HR 74 | Ht 65.0 in | Wt 145.6 lb

## 2023-05-12 DIAGNOSIS — R0609 Other forms of dyspnea: Secondary | ICD-10-CM | POA: Diagnosis not present

## 2023-05-12 DIAGNOSIS — I471 Supraventricular tachycardia, unspecified: Secondary | ICD-10-CM

## 2023-05-12 DIAGNOSIS — I2510 Atherosclerotic heart disease of native coronary artery without angina pectoris: Secondary | ICD-10-CM

## 2023-05-12 DIAGNOSIS — Z87891 Personal history of nicotine dependence: Secondary | ICD-10-CM

## 2023-05-12 DIAGNOSIS — E039 Hypothyroidism, unspecified: Secondary | ICD-10-CM

## 2023-05-12 DIAGNOSIS — R0789 Other chest pain: Secondary | ICD-10-CM | POA: Diagnosis not present

## 2023-05-12 NOTE — Patient Instructions (Signed)
 Medication Instructions:  Your physician recommends that you continue on your current medications as directed. Please refer to the Current Medication list given to you today.  *If you need a refill on your cardiac medications before your next appointment, please call your pharmacy*   Lab Work: None Ordered If you have labs (blood work) drawn today and your tests are completely normal, you will receive your results only by: MyChart Message (if you have MyChart) OR A paper copy in the mail If you have any lab test that is abnormal or we need to change your treatment, we will call you to review the results.   Testing/Procedures: Your physician has requested that you have an echocardiogram. Echocardiography is a painless test that uses sound waves to create images of your heart. It provides your doctor with information about the size and shape of your heart and how well your heart's chambers and valves are working. This procedure takes approximately one hour. There are no restrictions for this procedure. Please do NOT wear cologne, perfume, aftershave, or lotions (deodorant is allowed). Please arrive 15 minutes prior to your appointment time.  Please note: We ask at that you not bring children with you during ultrasound (echo/ vascular) testing. Due to room size and safety concerns, children are not allowed in the ultrasound rooms during exams. Our front office staff cannot provide observation of children in our lobby area while testing is being conducted. An adult accompanying a patient to their appointment will only be allowed in the ultrasound room at the discretion of the ultrasound technician under special circumstances. We apologize for any inconvenience.    Follow-Up: At [facility], you and your health needs are our priority.  As part of our continuing mission to provide you with exceptional heart care, we have created designated Provider Care Teams.  These Care Teams include your  primary Cardiologist (physician) and Advanced Practice Providers (APPs -  Physician Assistants and Nurse Practitioners) who all work together to provide you with the care you need, when you need it.  We recommend signing up for the patient portal called "MyChart".  Sign up information is provided on this After Visit Summary.  MyChart is used to connect with patients for Virtual Visits (Telemedicine).  Patients are able to view lab/test results, encounter notes, upcoming appointments, etc.  Non-urgent messages can be sent to your provider as well.   To learn more about what you can do with MyChart, go to [email or web address].    Your next appointment:   5 month(s)  The format for your next appointment:   In Person  Provider:   Gypsy Balsam, MD    Other Instructions NA

## 2023-05-12 NOTE — Progress Notes (Signed)
 Cardiology Office Note:    Date:  05/12/2023   ID:  Lashala, Laser [birth year], MRN [id number]  PCP:  Tamela Fake, MD  Cardiologist:  Ralene Burger, MD    Referring MD: Tamela Fake, MD   Chief Complaint  Patient presents with   vibration feeling on her heart    History of Present Illness:    Jasmine Hahn is a 63 y.o. female past medical history significant for PVCs, palpitations, atypical chest pain, cardiac catheterization done years ago showed nonobstructive disease recent stress test showed no evidence of ischemia.  Came to evaluate atypical symptoms again this time she felt some warm sensation around the left chest around the breast when she laid down in the bed.  She complained a lot about having some allergy to pollen but otherwise seems to be doing fine.  Did have some neuropsychological testing done for dementia she also did see pulmonologist for her dyspnea on exertion pulmonary function test were done but she does not not know the results of it  Past Medical History:  Diagnosis Date   Adenomatous colon polyp    Aftercare following right knee joint replacement surgery 09/01/2017   ALLERGIC RHINITIS, SEASONAL 06/26/2006   Qualifier: Diagnosis of  By: Neomi Banks MD, Anitra Ket.    Allergy    Angina pectoris (HCC) 07/03/2021   Arthritis    Asthma    Atypical chest pain 04/27/2018   CAD (coronary artery disease) 08/05/2021   Cervical cancer (HCC)    Chronic constipation 07/13/2019   COVID-19 02/25/2019   Discoid lupus    Diverticulosis    Dyspnea on exertion 07/03/2021   FATIGUE 08/23/2007   Qualifier: Diagnosis of  By: Tracy Friedlander     Former tobacco use 07/29/2017   Frontal headache 01/31/2021   GERD 06/26/2006   Qualifier: Diagnosis of  By: Neomi Banks MD, Anitra Ket.    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    Syncope    not sure why   Thyroid  disease    hypo   URI 08/23/2007   Qualifier: Diagnosis of   By: Tracy Friedlander      Replacing diagnoses that were inactivated after the 04/21/22 regulatory import     Wears partial dentures     Past Surgical History:  Procedure Laterality Date   ABDOMINAL HYSTERECTOMY     bone spurs     feet-both feet   CHOLECYSTECTOMY     COLONOSCOPY  08/11/2019   Dr Andriette Keeling. No neoplasia. Diverticulosis of colon   COLONOSCOPY  06/02/2014   Colon polyp status post polypectomy. Internal hemorrhoids   ELBOW SURGERY     Dr Morrison Archer   ESOPHAGOGASTRODUODENOSCOPY  02/2011   Medoff, NEG   ESOPHAGOGASTRODUODENOSCOPY  08/11/2019   Dr Andriette Keeling. Normal EGD examination   ESOPHAGOGASTRODUODENOSCOPY  06/02/2014   Mid gastritis. Otherwise normal EGD   KNEE ARTHROSCOPY WITH DRILLING/MICROFRACTURE Right 05/25/2014   Procedure: KNEE ARTHROSCOPY WITH MICROFRACTURE PATELLA FEMORAL CONDYLE;  Surgeon: Sandie Cross, MD;  Location: [facility];  Service: Orthopedics;  Laterality: Right;   KNEE ARTHROSCOPY WITH MEDIAL MENISECTOMY Right 05/25/2014   Procedure: RIGHT KNEE ARTHROSCOPY WITH PARTIAL MEDIAL;  Surgeon: Sandie Cross, MD;  Location: [facility];  Service: Orthopedics;  Laterality: Right;   LEFT HEART CATH AND CORONARY ANGIOGRAPHY N/A 07/05/2021   Procedure: LEFT HEART CATH AND CORONARY ANGIOGRAPHY;  Surgeon: Millicent Ally, MD;  Location: [facility];   Service: Cardiovascular;  Laterality: N/A;   LYSIS OF ADHESION Right 05/25/2014   Procedure: LYSIS OF ADHESION;  Surgeon: Sandie Cross, MD;  Location: [facility];  Service: Orthopedics;  Laterality: Right;   MOHS SURGERY     OOPHORECTOMY     PARTIAL KNEE ARTHROPLASTY Right 04/17/2016   Procedure: RIGHT KNEE UNICOMPARTMENTAL ARTHROPLASTY;  Surgeon: Ferd Householder, MD;  Location: [facility];  Service: Orthopedics;  Laterality: Right;   REPLACEMENT TOTAL KNEE Right    Skin removed on L leg     THYROIDECTOMY     x 2 surgeries - partial first time   TUBAL LIGATION      Current Medications: Current Meds  Medication Sig   aspirin  EC 81 MG tablet Take 1 tablet (81 mg total) by mouth daily. Swallow whole.   atomoxetine (STRATTERA) 25 MG capsule Take 25 mg by mouth daily.   azithromycin  (ZITHROMAX ) 250 MG tablet 2 tab (500mg ) day 1, then 1 tab (250 mg) day 2-5 (Patient taking differently: Take 250 mg by mouth See admin instructions. 2 tab (500mg ) day 1, then 1 tab (250 mg) day 2-5)   azithromycin  (ZITHROMAX ) 500 MG tablet Take 1 tab 3 times a week (Mon, Wed, Fri) starting 03/02/23 for 3 months (Patient taking differently: Take 500 mg by mouth 3 (three) times a week. Take 1 tab 3 times a week (Mon, Wed, Fri) starting 03/02/23 for 3 months)   baclofen (LIORESAL) 10 MG tablet Take 10 mg by mouth 3 (three) times daily.   BIOTIN PO Take 10,000 mcg by mouth daily.   budesonide  (PULMICORT ) 0.5 MG/2ML nebulizer solution Perform nasal rinse 2 times daily (Patient taking differently: Take 0.5 mg by nebulization daily. Perform nasal rinse 2 times daily)   donepezil (ARICEPT) 10 MG tablet Take 10 mg by mouth at bedtime.   famotidine  (PEPCID ) 20 MG tablet Take 20 mg by mouth at bedtime.   Flax OIL Take 15 mLs by mouth daily. Flax seed oil/ Mix with Thrive by le-Val shake   levothyroxine  (SYNTHROID ) 125 MCG tablet Take 125 mcg by mouth daily before breakfast.   linaclotide  (LINZESS )  145 MCG CAPS capsule Take 1 capsule (145 mcg total) by mouth daily before breakfast.   methocarbamol  (ROBAXIN ) 500 MG tablet Take 500 mg by mouth 4 (four) times daily.   metoprolol  succinate (TOPROL -XL) 25 MG 24 hr tablet Take 1 tablet (25 mg total) by mouth daily.   montelukast (SINGULAIR) 10 MG tablet Take 10 mg by mouth at bedtime.   Multiple Vitamins-Minerals (CENTRUM SILVER ADULT 50+ PO) Take 1 tablet by mouth daily.   nitrofurantoin , macrocrystal-monohydrate, (MACROBID ) 100 MG capsule Take 100 mg by mouth 2 (two) times daily.   nitrofurantoin , macrocrystal-monohydrate, (MACROBID ) 100 MG capsule Take 1 capsule (100 mg total) by mouth 2 (two) times daily.   nitroGLYCERIN  (NITROSTAT )  0.4 MG SL tablet Place 0.4 mg under the tongue every 5 (five) minutes as needed for chest pain.   oxyCODONE -acetaminophen  (PERCOCET/ROXICET) 5-325 MG tablet Take 1 tablet by mouth every 4 (four) hours as needed for severe pain (pain score 7-10).   pantoprazole  (PROTONIX ) 40 MG tablet Take 1 tablet (40 mg total) by mouth daily.   polyethylene glycol powder (GLYCOLAX/MIRALAX) 17 GM/SCOOP powder Take 17 g by mouth daily. Mix with Thrive   Prucalopride Succinate  (MOTEGRITY ) 2 MG TABS Take 1 tablet (2 mg total) by mouth daily.   Quercetin 500 MG CAPS Take 500 mg by mouth daily. Plus   rosuvastatin (CRESTOR) 10 MG tablet Take 1 tablet by mouth at bedtime.   vitamin B-12 (CYANOCOBALAMIN ) 1000 MCG tablet Take 1,000 mcg by mouth daily.   VIVELLE-DOT 0.1 MG/24HR patch Place 1 patch onto the skin 2 (two) times a week.    XHANCE  93 MCG/ACT EXHU USE 2 SPRAYS IN EACH NOSTRIL TWICE A DAY   zinc gluconate 50 MG tablet Take 50 mg by mouth at bedtime.   zolpidem  (AMBIEN ) 10 MG tablet Take 10 mg by mouth at bedtime.      Allergies:   Dog epithelium (canis lupus familiaris), Pollen extract, Uncaria tomentosa (cats claw), Prednisone , and Cat dander   Social History   Socioeconomic History   Marital status: Single    Spouse  name: Not on file   Number of children: 4   Years of education: Not on file   Highest education level: Not on file  Occupational History   Occupation: Secondary school teacher  Tobacco Use   Smoking status: Former    Current packs/day: 0.00    Average packs/day: 0.5 packs/day for 30.0 years (15.0 ttl pk-yrs)    Types: Cigarettes    Start date: 05/20/1981    Quit date: 05/21/2011    Years since quitting: 11.9   Smokeless tobacco: Never  Vaping Use   Vaping status: Never Used  Substance and Sexual Activity   Alcohol use: Never   Drug use: Not Currently    Types: Marijuana   Sexual activity: Not on file  Other Topics Concern   Not on file  Social History Narrative   Not on file   Social Drivers of Health   Financial Resource Strain: Not on file  Food Insecurity: Not on file  Transportation Needs: Not on file  Physical Activity: Not on file  Stress: Not on file  Social Connections: Unknown (05/31/2021)   Received from [facility]   Social Network    Social Network: Not on file     Family History: The patient's family history includes COPD in her maternal grandfather; Cancer - Colon in her brother; Colon cancer (age of onset: 43) in her brother; Colon polyps in her daughter and mother; Diabetes in her mother; Esophageal cancer in her brother and father; Heart attack in her mother; Heart disease in her maternal grandmother and mother; Hyperlipidemia in her brother and mother; Hypertension in her brother and mother; Hypothyroidism in her mother; Lung cancer in her brother and father; Stroke in her mother; Throat cancer in her father. There is no history of Rectal cancer or Stomach cancer. ROS:   Please see the history of present illness.    All 14 point review of systems negative except as described per history of present illness  EKGs/Labs/Other Studies Reviewed:    EKG Interpretation Date/Time:  Tuesday May 12 2023 16:54:20 EDT Ventricular Rate:  74 PR  Interval:  142 QRS Duration:  90 QT Interval:  376 QTC Calculation: 417 R Axis:   73  Text Interpretation: Normal sinus rhythm Normal ECG When compared with ECG of 12-Nov-2022 08:08, No significant change was found Confirmed by Ralene Burger [phone]) on 05/12/2023 4:57:42 PM    Recent Labs: 11/06/2022: ALT 21; BUN 9; Creatinine, Ser 0.65; Hemoglobin 14.2; Platelets 346.0; Potassium 3.8; Sodium 138  Recent Lipid Panel    Component Value Date/Time   CHOL 174 01/25/2014 1722   TRIG 252 (H) 01/25/2014 1722   HDL 40 01/25/2014 1722   CHOLHDL 4.4 01/25/2014 1722   VLDL 50 (H) 01/25/2014 1722   LDLCALC 84 01/25/2014 1722    Physical Exam:    VS:  BP 110/68 (BP Location: Left Arm, Patient Position: Sitting)   Pulse 74   Ht 5\' 5"  (1.651 m)   Wt 145 lb 9.6 oz (66 kg)   SpO2 98%   BMI 24.23 kg/m     Wt Readings from Last 3 Encounters:  05/12/23 145 lb 9.6 oz (66 kg)  12/09/22 147 lb 12.8 oz (67 kg)  11/19/22 149 lb (67.6 kg)     GEN:  Well nourished, well developed in no acute distress HEENT: Normal NECK: No JVD; No carotid bruits LYMPHATICS: No lymphadenopathy CARDIAC: RRR, no murmurs, no rubs, no gallops RESPIRATORY:  Clear to auscultation without rales, wheezing or rhonchi  ABDOMEN: Soft, non-tender, non-distended MUSCULOSKELETAL:  No edema; No deformity  SKIN: Warm and dry LOWER EXTREMITIES: no swelling NEUROLOGIC:  Alert and oriented x 3 PSYCHIATRIC:  Normal affect   ASSESSMENT:    1. Other chest pain   2. Dyspnea on exertion   3. Coronary artery disease involving native coronary artery of native heart without angina pectoris   4. SVT (supraventricular tachycardia) (HCC)   5. Acquired hypothyroidism   6. Former tobacco use   7. Atypical chest pain    PLAN:    In order of problems listed above:  Atypical chest pain.  Very bizarre weight did she described I am not sure exactly what to make out of this.  Recent stress test is negative.  I think he will be  continue monitoring the situation. Supraventricular tachycardia, denies having palpitations. History of tobacco abuse likely she does not smoke anymore. Dyslipidemia followed by primary care physician, her LDL 81 HDL 51 we will continue present management   Medication Adjustments/Labs and Tests Ordered: Current medicines are reviewed at length with the patient today.  Concerns regarding medicines are outlined above.  Orders Placed This Encounter  Procedures   EKG 12-Lead   ECHOCARDIOGRAM COMPLETE   Medication changes: No orders of the defined types were placed in this encounter.   Signed, Manfred Seed, MD, [facility] 05/12/2023 5:24 PM    [facility]

## 2023-05-20 ENCOUNTER — Other Ambulatory Visit: Payer: Self-pay | Admitting: Cardiology

## 2023-06-02 ENCOUNTER — Ambulatory Visit: Attending: Cardiology

## 2023-06-02 DIAGNOSIS — R0609 Other forms of dyspnea: Secondary | ICD-10-CM

## 2023-06-04 LAB — ECHOCARDIOGRAM COMPLETE
AV Mean grad: 4.1 mmHg
AV Peak grad: 7.8 mmHg
Ao pk vel: 1.4 m/s
Area-P 1/2: 4.35 cm2
MV M vel: 2.88 m/s
MV Peak grad: 33.2 mmHg
S' Lateral: 2.3 cm

## 2023-06-09 ENCOUNTER — Ambulatory Visit: Payer: BC Managed Care – PPO | Admitting: Allergy

## 2023-06-09 ENCOUNTER — Ambulatory Visit: Payer: Self-pay | Admitting: Cardiology

## 2023-06-17 DIAGNOSIS — J329 Chronic sinusitis, unspecified: Secondary | ICD-10-CM | POA: Diagnosis not present

## 2023-06-21 LAB — STREP PNEUMONIAE 23 SEROTYPES IGG
Pneumo Ab Type 1*: <0.1 ug/mL — ABNORMAL LOW (ref >1.3)
Pneumo Ab Type 12 (12F)*: 0.1 ug/mL — ABNORMAL LOW (ref >1.3)
Pneumo Ab Type 14*: 0.3 ug/mL — ABNORMAL LOW (ref >1.3)
Pneumo Ab Type 17 (17F)*: 7.8 ug/mL (ref >1.3)
Pneumo Ab Type 19 (19F)*: 3.1 ug/mL (ref >1.3)
Pneumo Ab Type 2*: 0.3 ug/mL — ABNORMAL LOW (ref >1.3)
Pneumo Ab Type 20*: 9.5 ug/mL (ref >1.3)
Pneumo Ab Type 22 (22F)*: 0.1 ug/mL — ABNORMAL LOW (ref >1.3)
Pneumo Ab Type 23 (23F)*: 12.6 ug/mL (ref >1.3)
Pneumo Ab Type 26 (6B)*: 0.9 ug/mL — ABNORMAL LOW (ref >1.3)
Pneumo Ab Type 3*: <0.1 ug/mL — ABNORMAL LOW (ref >1.3)
Pneumo Ab Type 34 (10A)*: 37.6 ug/mL (ref >1.3)
Pneumo Ab Type 4*: 17.3 ug/mL (ref >1.3)
Pneumo Ab Type 43 (11A)*: 0.8 ug/mL — ABNORMAL LOW (ref >1.3)
Pneumo Ab Type 5*: 0.6 ug/mL — ABNORMAL LOW (ref >1.3)
Pneumo Ab Type 51 (7F)*: 8.5 ug/mL (ref >1.3)
Pneumo Ab Type 54 (15B)*: 14.4 ug/mL (ref >1.3)
Pneumo Ab Type 56 (18C)*: 2.4 ug/mL (ref >1.3)
Pneumo Ab Type 57 (19A)*: 2.1 ug/mL (ref >1.3)
Pneumo Ab Type 68 (9V)*: 7.1 ug/mL (ref >1.3)
Pneumo Ab Type 70 (33F)*: >20.2 ug/mL (ref >1.3)
Pneumo Ab Type 8*: 3.3 ug/mL (ref >1.3)
Pneumo Ab Type 9 (9N)*: 0.5 ug/mL — ABNORMAL LOW (ref >1.3)

## 2023-06-22 ENCOUNTER — Ambulatory Visit: Payer: Self-pay | Admitting: Allergy

## 2023-06-24 ENCOUNTER — Telehealth: Payer: Self-pay

## 2023-06-24 NOTE — Telephone Encounter (Signed)
 Left message on My Chart with Echo results per Dr. Vanetta Shawl note. Routed to PCP.

## 2023-06-30 ENCOUNTER — Ambulatory Visit: Admitting: Allergy

## 2023-07-06 ENCOUNTER — Ambulatory Visit (INDEPENDENT_AMBULATORY_CARE_PROVIDER_SITE_OTHER): Admitting: Allergy and Immunology

## 2023-07-06 ENCOUNTER — Encounter: Payer: Self-pay | Admitting: Allergy and Immunology

## 2023-07-06 VITALS — BP 126/84 | HR 80 | Resp 22

## 2023-07-06 DIAGNOSIS — G43909 Migraine, unspecified, not intractable, without status migrainosus: Secondary | ICD-10-CM

## 2023-07-06 DIAGNOSIS — G500 Trigeminal neuralgia: Secondary | ICD-10-CM

## 2023-07-06 DIAGNOSIS — J3089 Other allergic rhinitis: Secondary | ICD-10-CM | POA: Diagnosis not present

## 2023-07-06 DIAGNOSIS — G4720 Circadian rhythm sleep disorder, unspecified type: Secondary | ICD-10-CM | POA: Diagnosis not present

## 2023-07-06 MED ORDER — CYPROHEPTADINE HCL 4 MG PO TABS: ORAL_TABLET | ORAL | 5 refills | Status: DC

## 2023-07-06 MED ORDER — RYALTRIS 665-25 MCG/ACT NA SUSP: NASAL | 5 refills | Status: AC

## 2023-07-06 NOTE — Patient Instructions (Addendum)
  1. Treat and prevent upper airway inflammation:   A. Ryaltris - 2 sprays each nostril 2 times per day  2. Treat and prevent headache:   A. Continue 12 hour sudafed sinus 2 times per day (for now)  B. Decrease caffeine and chocolate consumption  3. Treat and prevent sleep dysfunction:   A. Continue Ambien  (for now)  B. Decrease caffeine and chocolate consumption  C. Start periactin 4 mg - 1/2  tablet at bedtime  4. Obtain nocturnal oximetry study  5. Return to clinic in 4 weeks or earlier if needed  6. Influenza = Tamiflu. Covid = paxlovid

## 2023-07-06 NOTE — Progress Notes (Unsigned)
 [place] - [place] - [place] - [place] - [place]   Follow-up Note  Referring Provider: Tamela Fake, MD Primary Provider: Tamela Fake, MD Date of Office Visit: 07/06/2023  Subjective:   Jasmine Hahn (DOB: 1960/03/02) is a 63 y.o. female who [facility] in re-evaluation of the following:  HPI: Jasmine Hahn presents to this clinic in evaluation of headache and sinus.  I have never seen her in this clinic and her last visit with Dr. Tempie Fee was 21 April 2023.  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  If she does not take 12-hour Sudafed twice a day then she develops this problem and she cannot really function that well on occasion because of this facial pain and headache syndrome.  She will get a pain that will migrate all the way to her ear and run across her face and the pain in her ear is sharp and the pain in her face is pressure-like.  She also has disturbed sleep.  She is taking Ambien  every night which helps her get to sleep but then she wakes up after about 4 hours of sleep and the rest of her sleep is completely fractured.  She does not think she obstructs her breathing.  Should be noted that she takes her Sudafed at 8 AM in the morning and then at 10 PM in the evening before she goes to bed.  She has had a collection of diagnostic studies completed which identified the absence of any allergy and the absence of chronic sinusitis or upper airway obstruction confirmed by both a CT scan completed recently and an MRI of her head completed a few years ago.  She does have some issues with rhinitis manifested as intermittent nasal congestion and there appears to be some precipitants giving rise to this issue including exposure to cat and dog though she did not demonstrate any hypersensitivity against cat and dog and skin testing in IgE blood test.  And when she gets exposed to cat she actually  gets a little bit short of breath as well.  Fortunately, cat exposure is relatively rare.  Allergies as of 07/06/2023       Reactions   Dog Epithelium (canis Lupus Familiaris) Other (See Comments)   Other reaction(s): Eye Swelling, Wheezing   Pollen Extract Other (See Comments), Tinitus   Other reaction(s): Headache, Other   Uncaria Tomentosa (cats Claw)    Other reaction(s): eye redness, eye swelling, respiratory distress   Prednisone  Other (See Comments)   Causes patient to gain weight   Cat Dander Other (See Comments)   Eye Redness, Eye Swelling, Respiratory Distress   Strattera [atomoxetine Hcl] Hives        Medication List    aspirin  EC 81 MG tablet Take 1 tablet (81 mg total) by mouth daily. Swallow whole.   BIOTIN PO Take 10,000 mcg by mouth daily.   budesonide  0.5 MG/2ML nebulizer solution Commonly known as: PULMICORT  Perform nasal rinse 2 times daily   CENTRUM SILVER ADULT 50+ PO Take 1 tablet by mouth daily.   cyanocobalamin  1000 MCG tablet Commonly known as: VITAMIN B12 Take 1,000 mcg by mouth daily.   DULCOLAX PO Take by mouth at bedtime.   famotidine  20 MG tablet Commonly known as: PEPCID  Take 20 mg by mouth at bedtime.   Flax Oil Take 15 mLs by mouth daily. Flax seed oil/ Mix with Thrive by le-Val shake  levothyroxine  125 MCG tablet Commonly known as: SYNTHROID  Take 125 mcg by mouth daily before breakfast.   linaclotide  145 MCG Caps capsule Commonly known as: Linzess  Take 1 capsule (145 mcg total) by mouth daily before breakfast.   metoprolol  succinate 25 MG 24 hr tablet Commonly known as: TOPROL -XL Take 1 tablet by mouth once daily   NAC 600 PO Take by mouth daily.   nitroGLYCERIN  0.4 MG SL tablet Commonly known as: NITROSTAT  Place 0.4 mg under the tongue every 5 (five) minutes as needed for chest pain.   NUTRITIONAL SUPPLEMENT PO Take by mouth. Supplement for muscle spasms   NUTRITIONAL SUPPLEMENT PO Take by mouth. Curamed    pantoprazole  40 MG tablet Commonly known as: PROTONIX  Take 1 tablet (40 mg total) by mouth daily.   polyethylene glycol powder 17 GM/SCOOP powder Commonly known as: GLYCOLAX/MIRALAX Take 17 g by mouth daily. Mix with Thrive   pseudoephedrine 120 MG 12 hr tablet Commonly known as: SUDAFED Take 120 mg by mouth 2 (two) times daily.   Quercetin 500 MG Caps Take 500 mg by mouth daily. Plus   rosuvastatin 10 MG tablet Commonly known as: CRESTOR Take 1 tablet by mouth at bedtime.   STOOL SOFTENER PO Take by mouth at bedtime.   triamcinolone  cream 0.1 % Commonly known as: KENALOG Apply topically.   VITAMIN C PO Take by mouth daily.   VITAMIN D -3 PO Take by mouth daily.   Vivelle-Dot 0.1 MG/24HR patch Generic drug: estradiol Place 1 patch onto the skin 2 (two) times a week.   Xhance  93 MCG/ACT Exhu Generic drug: Fluticasone  Propionate USE 2 SPRAYS IN EACH NOSTRIL TWICE A DAY   zinc gluconate 50 MG tablet Take 50 mg by mouth at bedtime.   zolpidem  10 MG tablet Commonly known as: AMBIEN  Take 10 mg by mouth at bedtime.    Past Medical History:  Diagnosis Date   Adenomatous colon polyp    Aftercare following right knee joint replacement surgery 09/01/2017   ALLERGIC RHINITIS, SEASONAL 06/26/2006   Qualifier: Diagnosis of  By: Neomi Banks MD, Anitra Ket.    Allergy    Angina pectoris (HCC) 07/03/2021   Arthritis    Asthma    Atypical chest pain 04/27/2018   CAD (coronary artery disease) 08/05/2021   Cervical cancer (HCC)    Chronic constipation 07/13/2019   COVID-19 02/25/2019   Discoid lupus    Diverticulosis    Dyspnea on exertion 07/03/2021   FATIGUE 08/23/2007   Qualifier: Diagnosis of  By: Tracy Friedlander     Former tobacco use 07/29/2017   Frontal headache 01/31/2021   GERD 06/26/2006   Qualifier: Diagnosis of  By: Neomi Banks MD, Anitra Ket.    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    Syncope    not sure why  Thyroid  disease    hypo   URI 08/23/2007   Qualifier: Diagnosis of   By: Tracy Friedlander      Replacing diagnoses that were inactivated after the 04/21/22 regulatory import     Wears partial dentures     Past Surgical History:  Procedure Laterality Date   ABDOMINAL HYSTERECTOMY     bone spurs     feet-both feet   CHOLECYSTECTOMY     COLONOSCOPY  08/11/2019   Dr Andriette Keeling. No neoplasia. Diverticulosis of colon   COLONOSCOPY  06/02/2014   Colon polyp status post polypectomy. Internal hemorrhoids   ELBOW SURGERY     Dr Morrison Archer   ESOPHAGOGASTRODUODENOSCOPY  02/2011   Medoff, NEG   ESOPHAGOGASTRODUODENOSCOPY  08/11/2019   Dr Andriette Keeling. Normal EGD examination   ESOPHAGOGASTRODUODENOSCOPY  06/02/2014   Mid gastritis. Otherwise normal EGD   KNEE ARTHROSCOPY WITH DRILLING/MICROFRACTURE Right 05/25/2014   Procedure: KNEE ARTHROSCOPY WITH MICROFRACTURE PATELLA FEMORAL CONDYLE;  Surgeon: Sandie Cross, MD;  Location: [facility];  Service: Orthopedics;  Laterality: Right;   KNEE ARTHROSCOPY WITH MEDIAL MENISECTOMY Right 05/25/2014   Procedure: RIGHT KNEE ARTHROSCOPY WITH PARTIAL MEDIAL;  Surgeon: Sandie Cross, MD;  Location: [facility];  Service: Orthopedics;  Laterality: Right;   LEFT HEART CATH AND CORONARY ANGIOGRAPHY N/A 07/05/2021   Procedure: LEFT HEART CATH AND CORONARY  ANGIOGRAPHY;  Surgeon: Millicent Ally, MD;  Location: [facility];  Service: Cardiovascular;  Laterality: N/A;   LYSIS OF ADHESION Right 05/25/2014   Procedure: LYSIS OF ADHESION;  Surgeon: Sandie Cross, MD;  Location: [facility];  Service: Orthopedics;  Laterality: Right;   MOHS SURGERY     OOPHORECTOMY     PARTIAL KNEE ARTHROPLASTY Right 04/17/2016   Procedure: RIGHT KNEE UNICOMPARTMENTAL ARTHROPLASTY;  Surgeon: Ferd Householder, MD;  Location: [facility];  Service: Orthopedics;  Laterality: Right;   RECTOCELE REPAIR     REPLACEMENT TOTAL KNEE Right    Skin removed on L leg     THYROIDECTOMY     x 2 surgeries - partial first time   TUBAL LIGATION      Review of systems negative except as noted in HPI / PMHx or noted below:  Review of Systems  Constitutional: Negative.   HENT: Negative.    Eyes: Negative.   Respiratory: Negative.    Cardiovascular: Negative.   Gastrointestinal: Negative.   Genitourinary: Negative.   Musculoskeletal: Negative.   Skin: Negative.   Neurological: Negative.   Endo/Heme/Allergies: Negative.   Psychiatric/Behavioral: Negative.       Objective:   Vitals:   07/06/23 1128  BP: 126/84  Pulse: 80  Resp: (!) 22  SpO2: 98%          Physical Exam Constitutional:      Appearance: She is not diaphoretic.  HENT:     Head: Normocephalic.     Right Ear: Tympanic membrane, ear canal and external ear normal.     Left Ear: Tympanic membrane, ear canal and external ear normal.     Nose: Nose normal. No mucosal edema or rhinorrhea.     Mouth/Throat:     Pharynx: Uvula midline. No oropharyngeal exudate.   Eyes:     Conjunctiva/sclera: Conjunctivae normal.   Neck:     Thyroid : No thyromegaly.     Trachea: Trachea normal. No tracheal tenderness or tracheal deviation.   Cardiovascular:     Rate and Rhythm: Normal rate and regular rhythm.  Heart sounds: Normal heart sounds, S1 normal and S2 normal. No  murmur heard. Pulmonary:     Effort: No respiratory distress.     Breath sounds: Normal breath sounds. No stridor. No wheezing or rales.  Lymphadenopathy:     Head:     Right side of head: No tonsillar adenopathy.     Left side of head: No tonsillar adenopathy.     Cervical: No cervical adenopathy.   Skin:    Findings: No erythema or rash.     Nails: There is no clubbing.   Neurological:     Mental Status: She is alert.     Diagnostics: Results of a sinus CT scan obtained 28 November 2022 identified no evidence of sinusitis and no obstruction of ostiomeatal complex and normal nasal cavity.  Results of blood test obtained 20 February 2023 identified IgE 25 KU/L, the absence of any antigen specific IgE antibodies on an area to aero allergen IgE profile, IgG 1272 Mg/DL, IgM 91 Mg/DL, IgA 161 Mg/DL, Oni 3 out of 23 pneumococcal serotypes with adequate antibody levels.  Results of blood test obtained 17 Jun 2023 after receiving Pneumovax identified 13 out of 23 pneumococcal serotypes with adequate antibody levels.  Assessment and Plan:   1. Facial pain syndrome   2. Migraine syndrome   3. Dysfunction of sleep stage or arousal   4. Perennial allergic rhinitis    1. Treat and prevent upper airway inflammation:   A. Ryaltris - 2 sprays each nostril 2 times per day  2. Treat and prevent headache:   A. Continue 12 hour sudafed sinus 2 times per day (for now)  B. Decrease caffeine and chocolate consumption  3. Treat and prevent sleep dysfunction:   A. Continue Ambien  (for now)  B. Decrease caffeine and chocolate consumption  C. Start periactin 4 mg - 1/2  tablet at bedtime  4. Obtain nocturnal oximetry study  5. Return to clinic in 4 weeks or earlier if needed  6. Influenza = Tamiflu. Covid = paxlovid  Karol appears to have some type of facial pain headache syndrome that is responding to the use of Sudafed but if she misses this agent and she develops debilitating symptoms.   And I wonder if Sudafed is interfering with her quality of sleep as she has very fractured sleep which may or may not be related to the use of this medication.  I think the best way to work through this issue is to make sure that she has no upper airway inflammation by using a combination nasal steroid nasal antihistamine and we will start her on some Periactin at bedtime that may give rise to improvement regarding her sleep quality and hopefully will alter her headache sensitivity so that we can taper her off her Sudafed.  Will obtain a nocturnal oximetry study just as a screening tool to make sure were not dealing with a significantly obstructed airway during sleep giving rise to these issues  Schuyler Custard, MD Allergy / Immunology [facility]

## 2023-07-07 ENCOUNTER — Encounter: Payer: Self-pay | Admitting: Allergy and Immunology

## 2023-07-08 ENCOUNTER — Telehealth: Payer: Self-pay

## 2023-07-08 NOTE — Telephone Encounter (Signed)
 Faxed order for overnight oximetry on room air to [facility].

## 2023-07-14 DIAGNOSIS — G4720 Circadian rhythm sleep disorder, unspecified type: Secondary | ICD-10-CM | POA: Diagnosis not present

## 2023-07-17 ENCOUNTER — Encounter: Payer: Self-pay | Admitting: Allergy and Immunology

## 2023-07-22 ENCOUNTER — Telehealth: Payer: Self-pay

## 2023-07-22 NOTE — Telephone Encounter (Signed)
-----   Message from ERIC J KOZLOW sent at 07/15/2023  4:51 PM EDT ----- Please inform Jasmine Hahn that her oxygen study came back and there is no evidence that she has low oxygen while sleeping.

## 2023-07-22 NOTE — Telephone Encounter (Signed)
 Called and informed patient of Dr. Kathyrn Lass message.

## 2023-08-05 ENCOUNTER — Encounter: Payer: Self-pay | Admitting: Allergy and Immunology

## 2023-08-05 ENCOUNTER — Ambulatory Visit: Admitting: Allergy and Immunology

## 2023-08-05 VITALS — BP 128/78 | HR 80 | Resp 18

## 2023-08-05 DIAGNOSIS — G4720 Circadian rhythm sleep disorder, unspecified type: Secondary | ICD-10-CM

## 2023-08-05 DIAGNOSIS — G500 Trigeminal neuralgia: Secondary | ICD-10-CM

## 2023-08-05 DIAGNOSIS — M7989 Other specified soft tissue disorders: Secondary | ICD-10-CM | POA: Diagnosis not present

## 2023-08-05 DIAGNOSIS — G43909 Migraine, unspecified, not intractable, without status migrainosus: Secondary | ICD-10-CM

## 2023-08-05 DIAGNOSIS — J3089 Other allergic rhinitis: Secondary | ICD-10-CM | POA: Diagnosis not present

## 2023-08-05 DIAGNOSIS — L03032 Cellulitis of left toe: Secondary | ICD-10-CM

## 2023-08-05 DIAGNOSIS — M79675 Pain in left toe(s): Secondary | ICD-10-CM | POA: Diagnosis not present

## 2023-08-05 MED ORDER — CYPROHEPTADINE HCL 4 MG PO TABS: ORAL_TABLET | ORAL | 1 refills | Status: AC

## 2023-08-05 NOTE — Progress Notes (Signed)
 [place] - [place] - [place] - [place] - [place]   Follow-up Note  Referring Provider: Gable Cambric, MD Primary Provider: Gable Cambric, MD Date of Office Visit: 08/05/2023  Subjective:   Jasmine Hahn (DOB: 06-Jan-1961) is a 63 y.o. female who [facility] in re-evaluation of the following:  HPI: Jasmine Hahn returns to this clinic in evaluation of facial pain syndrome/headache, sleep dysfunction, rhinitis.  I last saw her in this clinic 06 July 2023.  She has noticed a pretty significant improvement regarding her sleep quality and her headaches while using Periactin  at 2 mg at night.  In fact, she has had no problems with sleep and she has had no headaches.  But she does develop dreaming with the use of Periactin  in conjunction with her Ambien .  And she has had no problems with her nose while using Ryaltris .  She has developed a swollen tender red area on the ball of her left foot since Saturday, 4 days ago, after walking on the beach and possibly getting punctured with something on the beach.  She contacted her primary care doctor yesterday who started her on Augmentin.  She has had 2 doses of Augmentin so far.  Allergies as of 08/05/2023       Reactions   Dog Epithelium (canis Lupus Familiaris) Other (See Comments)   Other reaction(s): Eye Swelling, Wheezing   Pollen Extract Other (See Comments), Tinitus   Other reaction(s): Headache, Other   Uncaria Tomentosa (cats Claw)    Other reaction(s): eye redness, eye swelling, respiratory distress   Prednisone  Other (See Comments)   Causes patient to gain weight   Cat Dander Other (See Comments)   Eye Redness, Eye Swelling, Respiratory Distress   Strattera [atomoxetine Hcl] Hives        Medication List    amoxicillin-clavulanate 875-125 MG tablet Commonly known as: AUGMENTIN Take 1 tablet by mouth 2 (two) times daily.   aspirin  EC 81 MG tablet Take 1 tablet (81 mg total)  by mouth daily. Swallow whole.   BIOTIN PO Take 10,000 mcg by mouth daily.   budesonide  0.5 MG/2ML nebulizer solution Commonly known as: PULMICORT  Perform nasal rinse 2 times daily   CENTRUM SILVER ADULT 50+ PO Take 1 tablet by mouth daily.   cyanocobalamin  1000 MCG tablet Commonly known as: VITAMIN B12 Take 1,000 mcg by mouth daily.   cyproheptadine  4 MG tablet Commonly known as: PERIACTIN  Take one-half to one tablet by mouth every night as directed.   DULCOLAX PO Take by mouth at bedtime.   famotidine  20 MG tablet Commonly known as: PEPCID  Take 20 mg by mouth at bedtime.   Flax Oil Take 15 mLs by mouth daily. Flax seed oil/ Mix with Thrive by le-Val shake   levothyroxine  125 MCG tablet Commonly known as: SYNTHROID  Take 125 mcg by mouth daily before breakfast.   linaclotide  145 MCG Caps capsule Commonly known as: Linzess  Take 1 capsule (145 mcg total) by mouth daily before breakfast.   metoprolol  succinate 25 MG 24 hr tablet Commonly known as: TOPROL -XL Take 1 tablet by mouth once daily   NAC 600 PO Take by mouth daily.   nitroGLYCERIN  0.4 MG SL tablet Commonly known as: NITROSTAT  Place 0.4 mg under the tongue every 5 (five) minutes as needed for chest pain.   NUTRITIONAL SUPPLEMENT PO Take by mouth. Supplement for muscle spasms   NUTRITIONAL SUPPLEMENT PO Take by mouth. Curamed   pantoprazole  40 MG tablet  Commonly known as: PROTONIX  Take 1 tablet (40 mg total) by mouth daily.   polyethylene glycol powder 17 GM/SCOOP powder Commonly known as: GLYCOLAX/MIRALAX Take 17 g by mouth daily. Mix with Thrive   pseudoephedrine 120 MG 12 hr tablet Commonly known as: SUDAFED Take 120 mg by mouth 2 (two) times daily.   Quercetin 500 MG Caps Take 500 mg by mouth daily. Plus   rosuvastatin 10 MG tablet Commonly known as: CRESTOR Take 1 tablet by mouth at bedtime.   Ryaltris  665-25 MCG/ACT Susp Generic drug: Olopatadine-Mometasone  Use two sprays in each  nostril twice daily.   STOOL SOFTENER PO Take by mouth at bedtime.   triamcinolone  cream 0.1 % Commonly known as: KENALOG Apply topically.   VITAMIN C PO Take by mouth daily.   VITAMIN D -3 PO Take by mouth daily.   Vivelle-Dot 0.1 MG/24HR patch Generic drug: estradiol Place 1 patch onto the skin 2 (two) times a week.   zinc gluconate 50 MG tablet Take 50 mg by mouth at bedtime.   zolpidem  10 MG tablet Commonly known as: AMBIEN  Take 10 mg by mouth at bedtime.     Past Medical History:  Diagnosis Date   Adenomatous colon polyp    Aftercare following right knee joint replacement surgery 09/01/2017   ALLERGIC RHINITIS, SEASONAL 06/26/2006   Qualifier: Diagnosis of  By: Amon MD, Aloysius BRAVO.    Allergy    Angina pectoris (HCC) 07/03/2021   Arthritis    Asthma    Atypical chest pain 04/27/2018   CAD (coronary artery disease) 08/05/2021   Cervical cancer (HCC)    Chronic constipation 07/13/2019   COVID-19 02/25/2019   Discoid lupus    Diverticulosis    Dyspnea on exertion 07/03/2021   FATIGUE 08/23/2007   Qualifier: Diagnosis of  By: Antonio ROSALEA Rockers     Former tobacco use 07/29/2017   Frontal headache 01/31/2021   GERD 06/26/2006   Qualifier: Diagnosis of  By: Amon MD, Aloysius BRAVO.    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    Syncope     not sure why   Thyroid  disease    hypo   URI 08/23/2007   Qualifier: Diagnosis of   By: Antonio ROSALEA Rockers      Replacing diagnoses that were inactivated after the 04/21/22 regulatory import     Wears partial dentures     Past Surgical History:  Procedure Laterality Date   ABDOMINAL HYSTERECTOMY     bone spurs     feet-both feet   CHOLECYSTECTOMY     COLONOSCOPY  08/11/2019   Dr Luis. No neoplasia. Diverticulosis of colon   COLONOSCOPY  06/02/2014   Colon polyp status post polypectomy. Internal hemorrhoids   ELBOW SURGERY     Dr Dorean   ESOPHAGOGASTRODUODENOSCOPY  02/2011  Medoff, NEG   ESOPHAGOGASTRODUODENOSCOPY  08/11/2019   Dr Luis. Normal EGD examination   ESOPHAGOGASTRODUODENOSCOPY  06/02/2014   Mid gastritis. Otherwise normal EGD   KNEE ARTHROSCOPY WITH DRILLING/MICROFRACTURE Right 05/25/2014   Procedure: KNEE ARTHROSCOPY WITH MICROFRACTURE PATELLA FEMORAL CONDYLE;  Surgeon: Toribio Chancy, MD;  Location: [facility];  Service: Orthopedics;  Laterality: Right;   KNEE ARTHROSCOPY WITH MEDIAL MENISECTOMY Right 05/25/2014   Procedure: RIGHT KNEE ARTHROSCOPY WITH PARTIAL MEDIAL;  Surgeon: Toribio Chancy, MD;  Location: [facility];  Service: Orthopedics;  Laterality: Right;   LEFT HEART CATH AND CORONARY ANGIOGRAPHY N/A 07/05/2021   Procedure: LEFT HEART CATH AND CORONARY ANGIOGRAPHY;  Surgeon: Burnard Debby LABOR, MD;  Location: [facility];  Service: Cardiovascular;  Laterality: N/A;   LYSIS OF ADHESION Right 05/25/2014   Procedure: LYSIS OF ADHESION;  Surgeon: Toribio Chancy, MD;  Location: [facility];  Service: Orthopedics;  Laterality: Right;   MOHS SURGERY     OOPHORECTOMY     PARTIAL KNEE ARTHROPLASTY Right 04/17/2016   Procedure: RIGHT KNEE UNICOMPARTMENTAL ARTHROPLASTY;  Surgeon: Toribio JULIANNA Chancy, MD;  Location: [facility];  Service: Orthopedics;  Laterality: Right;   RECTOCELE REPAIR     REPLACEMENT TOTAL  KNEE Right    Skin removed on L leg     THYROIDECTOMY     x 2 surgeries - partial first time   TUBAL LIGATION      Review of systems negative except as noted in HPI / PMHx or noted below:  Review of Systems  Constitutional: Negative.   HENT: Negative.    Eyes: Negative.   Respiratory: Negative.    Cardiovascular: Negative.   Gastrointestinal: Negative.   Genitourinary: Negative.   Musculoskeletal: Negative.   Skin: Negative.   Neurological: Negative.   Endo/Heme/Allergies: Negative.   Psychiatric/Behavioral: Negative.       Objective:   Vitals:   08/05/23 0815  BP: 128/78  Pulse: 80  Resp: 18  SpO2: 98%          Physical Exam Constitutional:      Appearance: She is not diaphoretic.  HENT:     Head: Normocephalic.     Right Ear: Tympanic membrane, ear canal and external ear normal.     Left Ear: Tympanic membrane, ear canal and external ear normal.     Nose: Nose normal. No mucosal edema or rhinorrhea.     Mouth/Throat:     Pharynx: Uvula midline. No oropharyngeal exudate.  Eyes:     Conjunctiva/sclera: Conjunctivae normal.  Neck:     Thyroid : No thyromegaly.     Trachea: Trachea normal. No tracheal tenderness or tracheal deviation.  Cardiovascular:     Rate and Rhythm: Normal rate and regular rhythm.     Heart sounds: Normal heart sounds, S1 normal and S2 normal. No murmur heard. Pulmonary:     Effort: No respiratory distress.     Breath sounds: Normal breath sounds. No stridor. No wheezing or rales.  Lymphadenopathy:     Head:     Right side of head: No tonsillar adenopathy.     Left side of head: No tonsillar adenopathy.     Cervical: No cervical adenopathy.  Skin:    Findings: Rash (erythema bottom and side of lerge toe left) present. No erythema.     Nails: There is no clubbing.  Neurological:     Mental Status: She is alert.     Diagnostics: Results of a nocturnal  oximetry study identified 9 seconds of sleep time spent with an oxygen  saturation below 88%.  Assessment and Plan:   1. Facial pain syndrome   2. Migraine syndrome   3. Dysfunction of sleep stage or arousal   4. Perennial allergic rhinitis   5. Cellulitis of toe of left foot    1. Treat and prevent upper airway inflammation:   A. Ryaltris  - 2 sprays each nostril 1-2 times per day  2. Treat and prevent headache:   A. Decrease caffeine and chocolate consumption  3. Treat and prevent sleep dysfunction:   A. Taper ambien  over next 1-2 weeks  B. Continue Periactin  4 ng - 1/2 - 1 tablet at bedtime  C. Decrease caffeine and chocolate consumption  4. If needed:   A. 12 hour sudafed sinus 2 times per day  B. Nasal saline  5. Continue Augmentin for cellulitis as directed by Dr. Rufino.  Vibrio vulnificus?  Gout?  Further evaluation and treatment?  6. Return to clinic in 12 weeks or earlier if needed  7. Influenza = Tamiflu. Covid = paxlovid  Kendal is doing much better while using cyproheptadine  to control her chronic facial pain/migraine syndrome and her sleep dysfunction and I think there may be an opportunity to consolidate some of her treatment for sleep dysfunction by having her taper off Ambien  over the course of the next 1 to 2 weeks while she uses Periactin  at either 2 mg or 4 mg at bedtime.  And her nose is really doing very well while using her combination nasal steroid nasal antihistamine spray.  Will see her back in this clinic in 12 weeks.  I did have a talk with her today about presumed cellulitis of her left large toe.  It is quite possible that she still has a foreign body located within that area from a puncture from possibly a shell on the beach.  1 must always be cognizant of the fact that she was walking in salt water when this apparent puncture occurred and if she does not respond to the Augmentin then maybe she would require a fluoroquinolone to cover possible Vibrio vulnificus.  And of course, the location of her large toe being inflamed  always suggest the possibility that gout may be playing a role although there does appear to be a history of a puncture that developed while walking in salt water.  She is going to follow-up with her primary care doctor regarding this issue.  I will see her back in this clinic in 12 weeks or earlier if there is a problem.  Camellia Denis, MD Allergy / Immunology [facility]

## 2023-08-05 NOTE — Patient Instructions (Addendum)
  1. Treat and prevent upper airway inflammation:   A. Ryaltris  - 2 sprays each nostril 1-2 times per day  2. Treat and prevent headache:   A. Decrease caffeine and chocolate consumption  3. Treat and prevent sleep dysfunction:   A. Taper ambien  over next 1-2 weeks  B. Continue Periactin  4 ng - 1/2 - 1 tablet at bedtime  C. Decrease caffeine and chocolate consumption  4. If needed:   A. 12 hour sudafed sinus 2 times per day  B. Nasal saline  5. Continue Augmentin for cellulitis as directed by Dr. Rufino.  Vibrio vulnificus?  Gout?  Further evaluation and treatment?  6. Return to clinic in 12 weeks or earlier if needed  7. Influenza = Tamiflu. Covid = paxlovid

## 2023-08-06 DIAGNOSIS — M79675 Pain in left toe(s): Secondary | ICD-10-CM | POA: Diagnosis not present

## 2023-08-06 DIAGNOSIS — M7989 Other specified soft tissue disorders: Secondary | ICD-10-CM | POA: Diagnosis not present

## 2023-08-17 ENCOUNTER — Other Ambulatory Visit: Payer: Self-pay | Admitting: Cardiology

## 2023-08-25 DIAGNOSIS — Z6827 Body mass index (BMI) 27.0-27.9, adult: Secondary | ICD-10-CM | POA: Diagnosis not present

## 2023-08-25 DIAGNOSIS — Z01419 Encounter for gynecological examination (general) (routine) without abnormal findings: Secondary | ICD-10-CM | POA: Diagnosis not present

## 2023-08-27 ENCOUNTER — Telehealth: Payer: Self-pay | Admitting: *Deleted

## 2023-08-27 DIAGNOSIS — R490 Dysphonia: Secondary | ICD-10-CM | POA: Diagnosis not present

## 2023-08-27 DIAGNOSIS — E039 Hypothyroidism, unspecified: Secondary | ICD-10-CM | POA: Diagnosis not present

## 2023-08-27 DIAGNOSIS — E785 Hyperlipidemia, unspecified: Secondary | ICD-10-CM | POA: Diagnosis not present

## 2023-08-27 DIAGNOSIS — I10 Essential (primary) hypertension: Secondary | ICD-10-CM | POA: Diagnosis not present

## 2023-08-27 NOTE — Telephone Encounter (Signed)
 Rakiya's pcp wants her to restart montelukast and she wants to make sure that is okay.   She also wants to let you know that she cannot tolerate the periactin . She is too drowsy and cannot function at work the next day.

## 2023-08-27 NOTE — Telephone Encounter (Signed)
 Is there anything she can take in place of the periactin ?

## 2023-08-28 NOTE — Telephone Encounter (Signed)
Informed of instructions.

## 2023-09-01 DIAGNOSIS — Z87891 Personal history of nicotine dependence: Secondary | ICD-10-CM | POA: Diagnosis not present

## 2023-09-01 DIAGNOSIS — R490 Dysphonia: Secondary | ICD-10-CM | POA: Diagnosis not present

## 2023-09-01 DIAGNOSIS — R0609 Other forms of dyspnea: Secondary | ICD-10-CM | POA: Diagnosis not present

## 2023-09-01 DIAGNOSIS — E039 Hypothyroidism, unspecified: Secondary | ICD-10-CM | POA: Diagnosis not present

## 2023-09-14 ENCOUNTER — Ambulatory Visit: Admitting: Obstetrics

## 2023-09-22 ENCOUNTER — Ambulatory Visit: Admitting: Obstetrics and Gynecology

## 2023-10-07 IMAGING — CT CT HEAD W/O CM
3 series · 14 of 47 positions shown, 16 images · non-contrast
Comparison: None.

CLINICAL DATA: Headache, new or worsening (Age >= 50y); sinus pain,
has finished 4 rounds of antibiotics without improvement

EXAM:
CT HEAD WITHOUT CONTRAST
CT MAXILLOFACIAL WITHOUT CONTRAST
TECHNIQUE: Multidetector CT imaging of the head and maxillofacial structures
were performed using the standard protocol without intravenous
contrast. Multiplanar CT image reconstructions of the maxillofacial
structures were also generated.

[Series 2: head wo · axial · 0.46mm/px · z∈[+1202,+1342]mm · 8 of 34 slices shown, 10 images]
[im 3/34  brain]
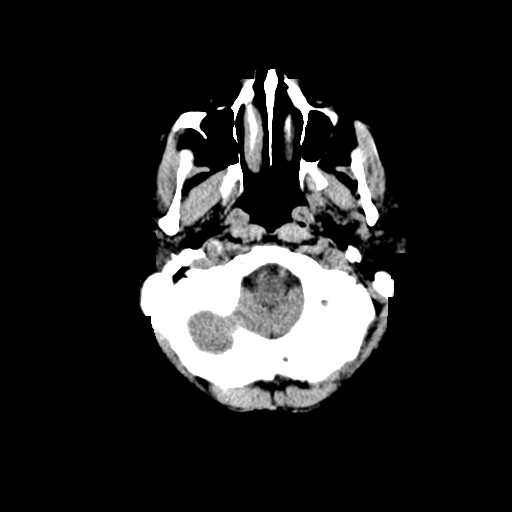
[im 3/34  bone]
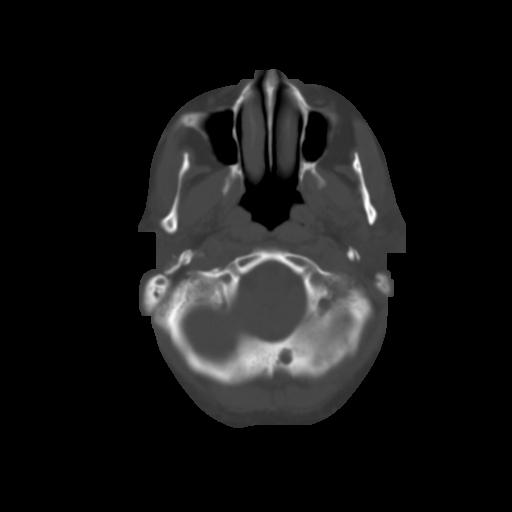
[im 7/34  brain]
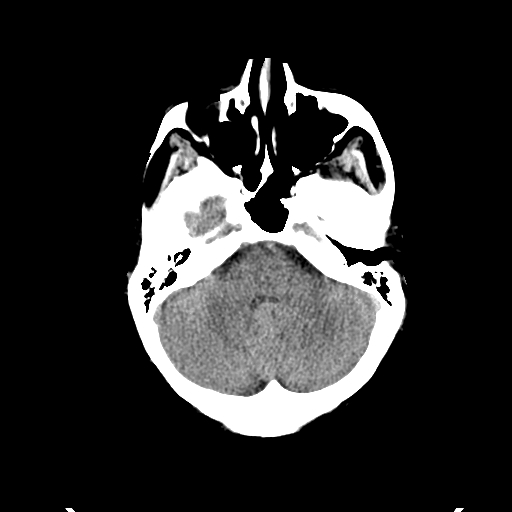
[im 11/34  brain]
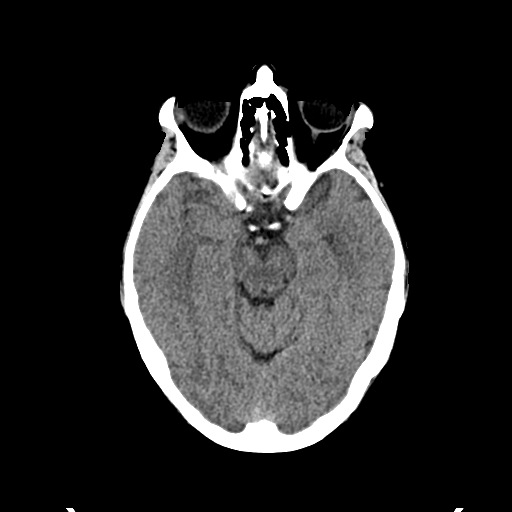
[im 15/34  brain]
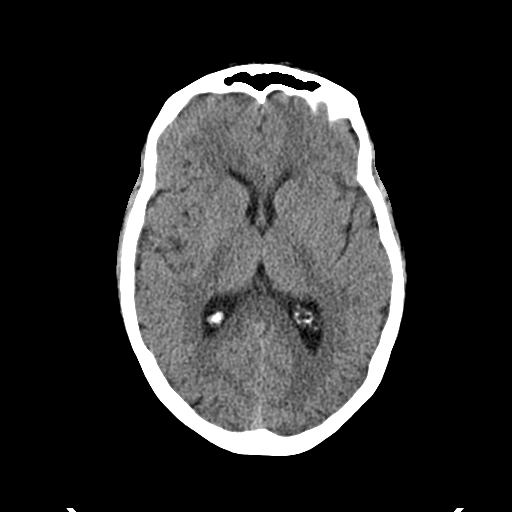
[im 19/34  brain]
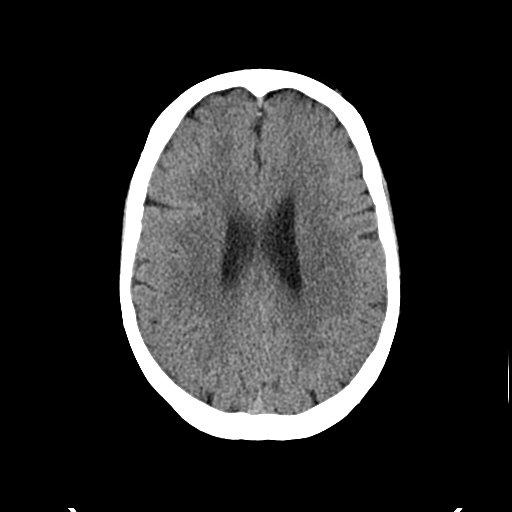
[im 19/34  bone]
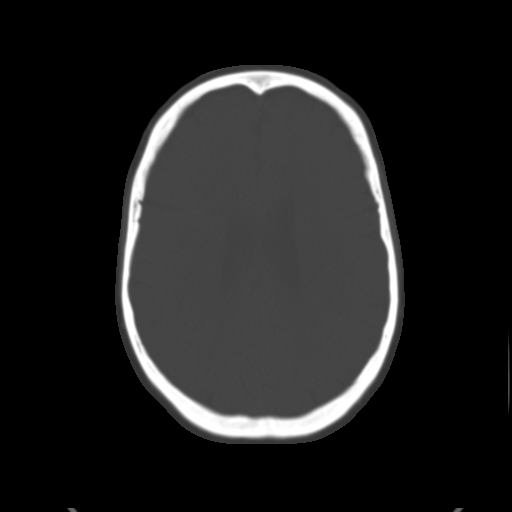
[im 23/34  brain]
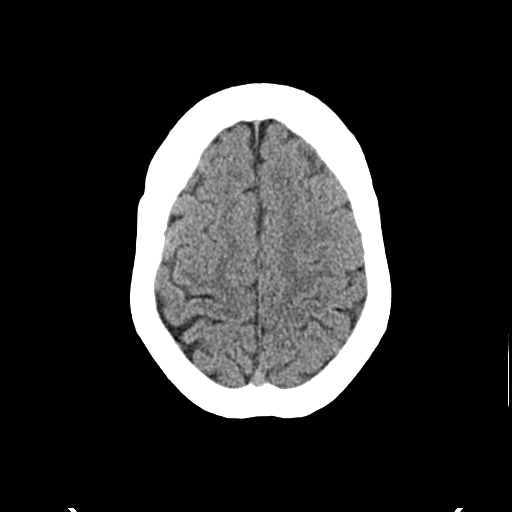
[im 27/34  brain]
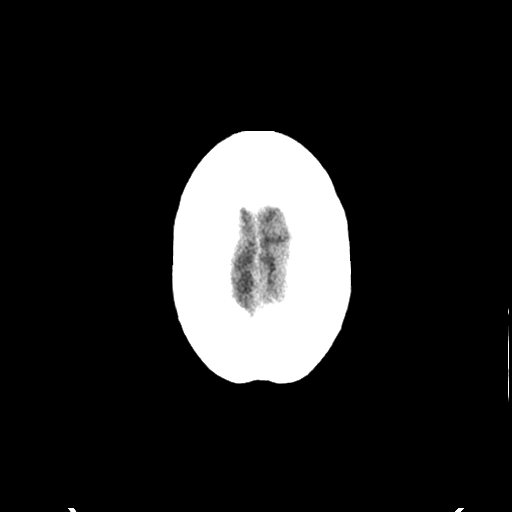
[im 31/34  brain]
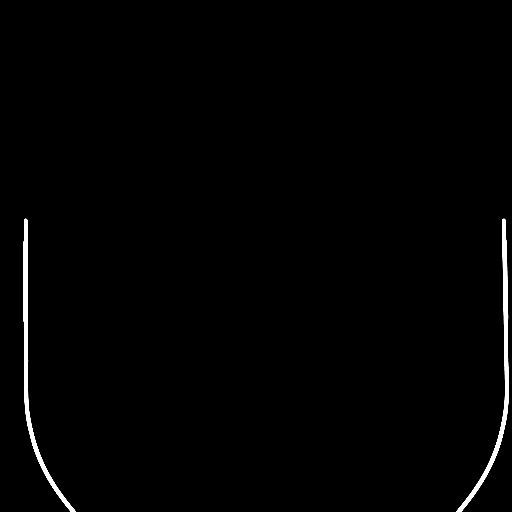

[Series 4: cor head wo · coronal · 0.33mm/px · 3 of 84 slices shown]
[im 28/84  brain]
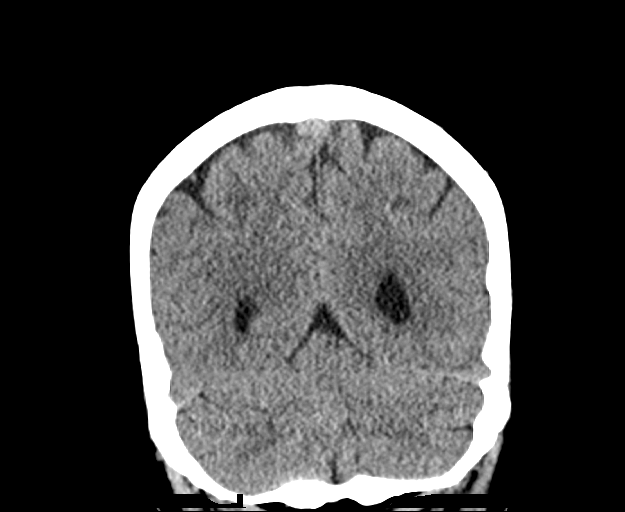
[im 37/84  brain]
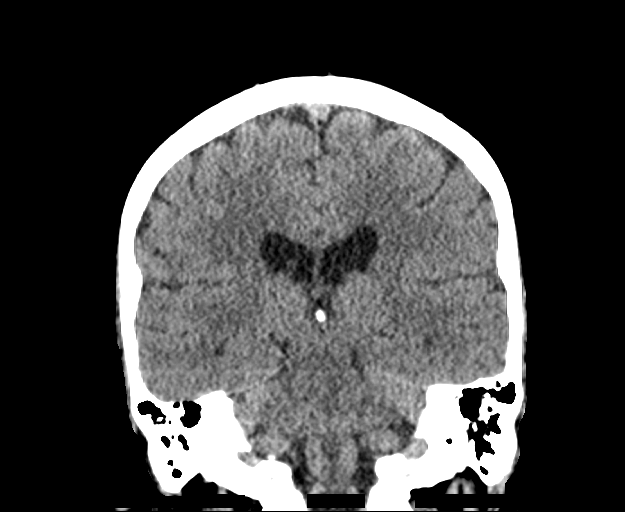
[im 47/84  brain]
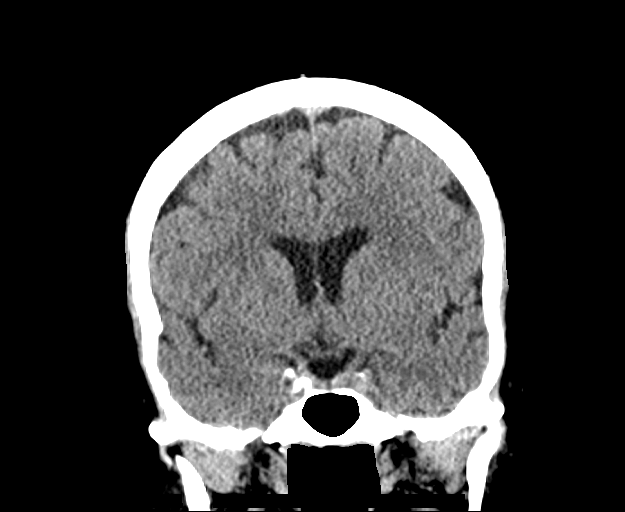

[Series 5: sag head wo · sagittal · 0.33mm/px · 3 of 64 slices shown]
[im 22/64  brain]
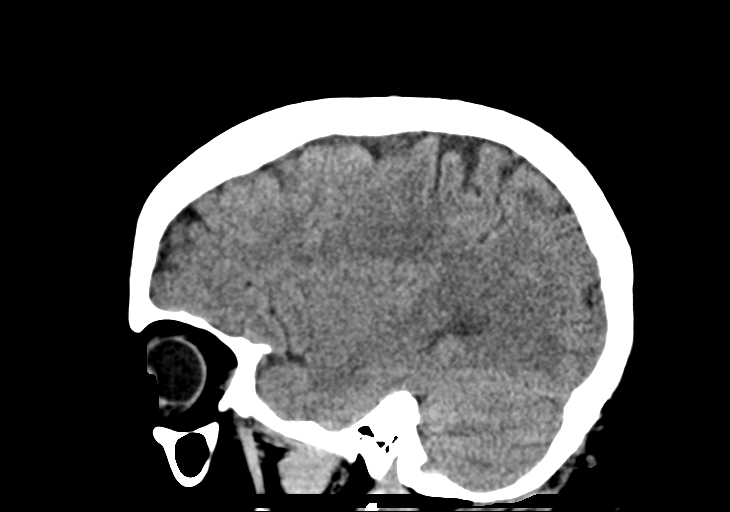
[im 32/64  brain]
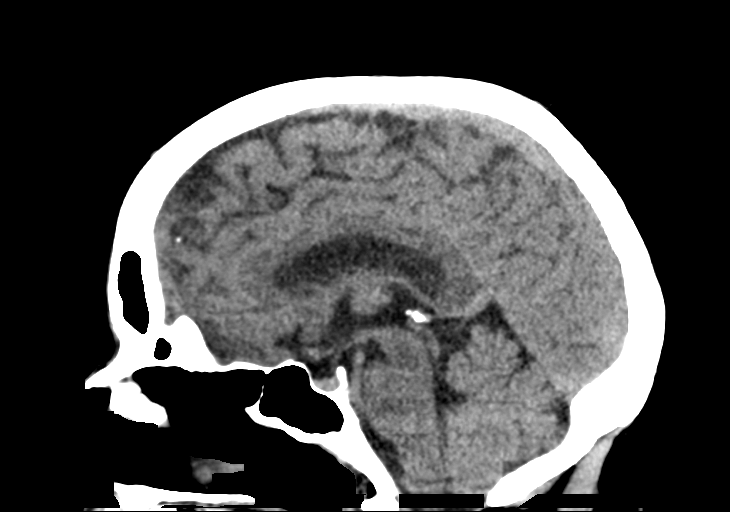
[im 43/64  brain]
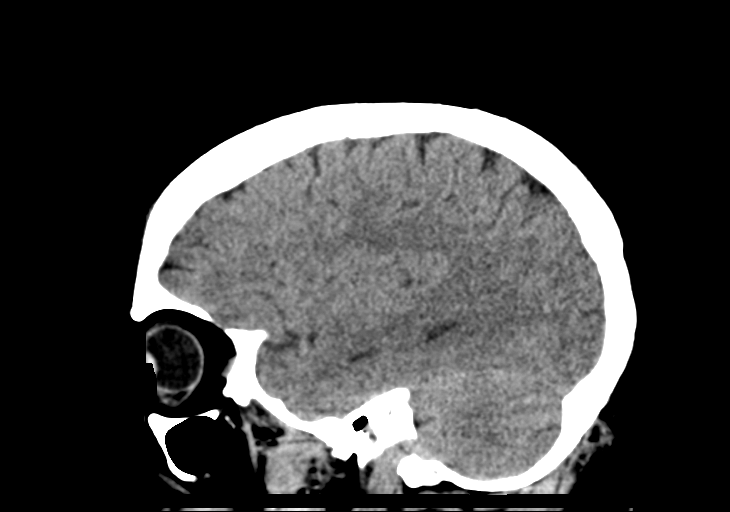

[14 of 47 positions shown; findings below may reference images not displayed]

FINDINGS: CT HEAD FINDINGS

Brain: There is no acute intracranial hemorrhage, mass effect, or
edema. Gray-white differentiation is preserved. No extra-axial
collection.

Vascular: There is intracranial atherosclerotic calcification at the
skull base.

Skull: Unremarkable.

Other: Mastoid air cells are clear.

CT MAXILLOFACIAL FINDINGS

Osseous: Unremarkable.

Orbits: Unremarkable.

Sinuses: Trace mucosal thickening. Drainage pathways are patent.
Nasal septum is near midline.

Soft tissues: Calcified plaque at the left greater than right ICA
origins. Partially imaged metallic artifact adjacent to the left
thyroid cartilage.
IMPRESSION: No acute intracranial abnormality.

No significant paranasal sinus inflammatory changes.

## 2023-10-07 IMAGING — CT CT MAXILLOFACIAL W/O CM
3 series · 16 of 47 positions shown, 19 images · non-contrast
Comparison: None.

CLINICAL DATA: Headache, new or worsening (Age >= 50y); sinus pain,
has finished 4 rounds of antibiotics without improvement

EXAM:
CT HEAD WITHOUT CONTRAST
CT MAXILLOFACIAL WITHOUT CONTRAST
TECHNIQUE: Multidetector CT imaging of the head and maxillofacial structures
were performed using the standard protocol without intravenous
contrast. Multiplanar CT image reconstructions of the maxillofacial
structures were also generated.

[Series 2: max soft · axial · 0.41mm/px · z∈[+1132,+1278]mm · 10 of 85 slices shown, 13 images]
[im 6/85  brain]
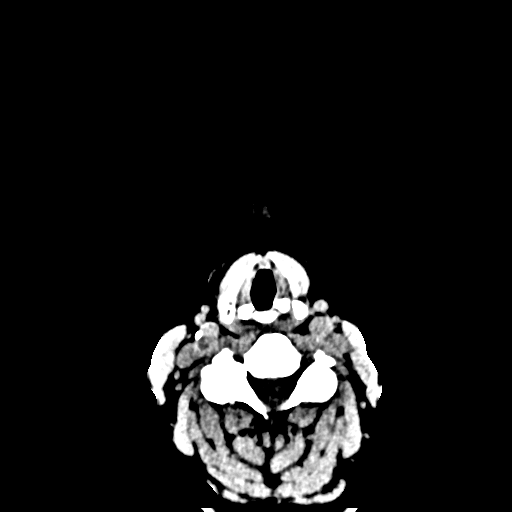
[im 6/85  bone]
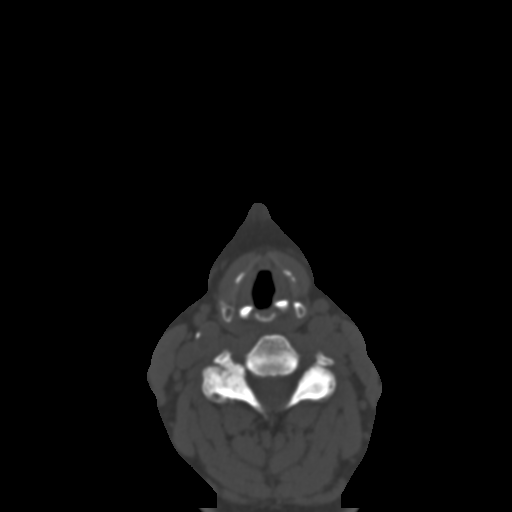
[im 15/85  bone]
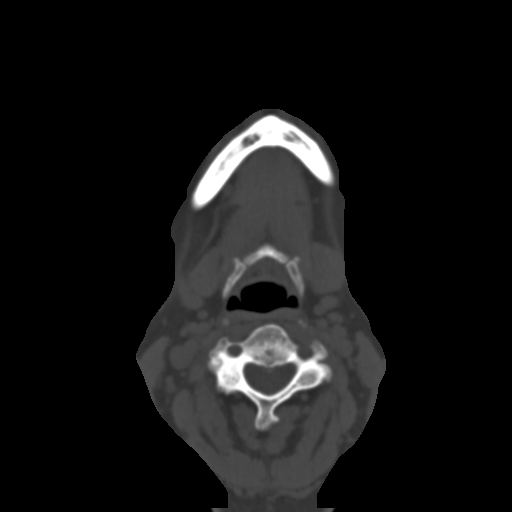
[im 24/85  bone]
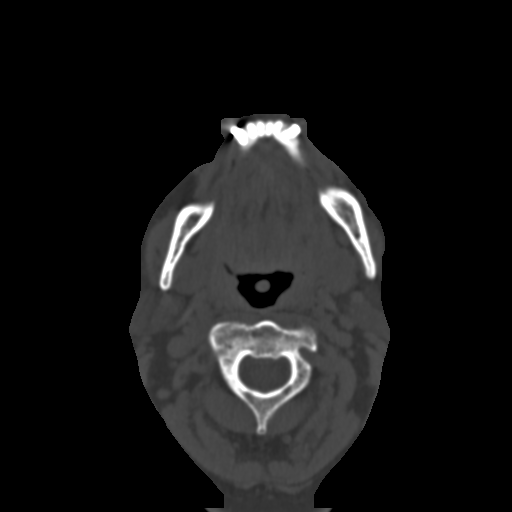
[im 29/85  bone]
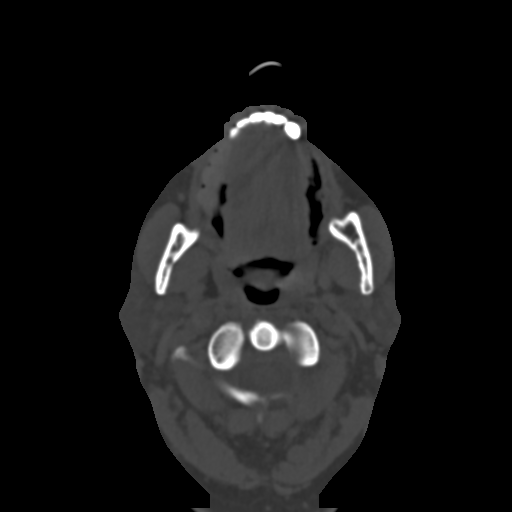
[im 38/85  brain]
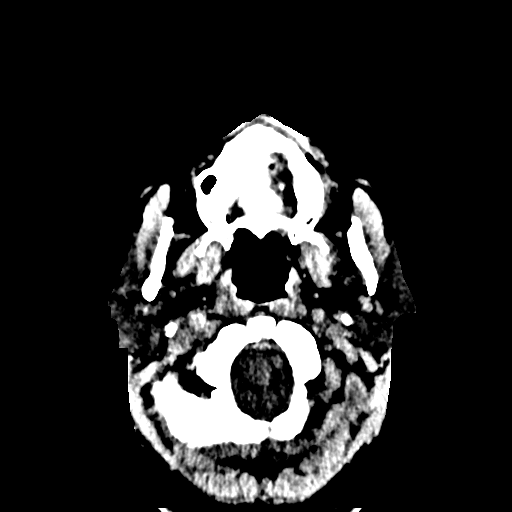
[im 38/85  bone]
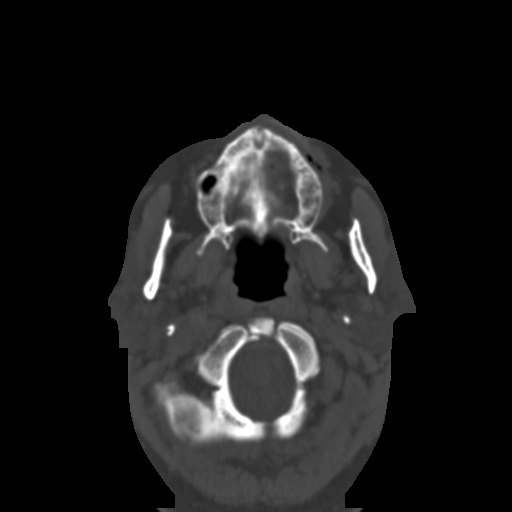
[im 47/85  bone]
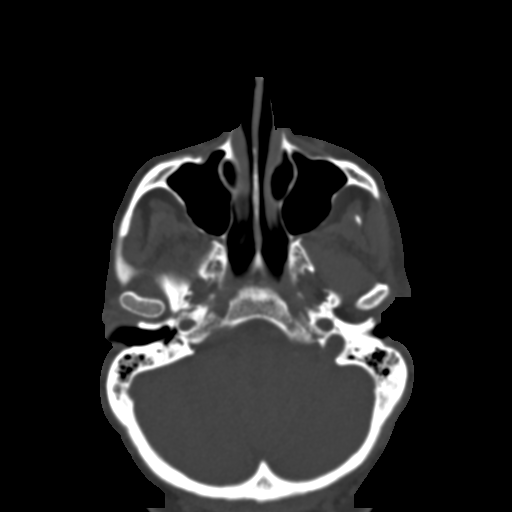
[im 56/85  bone]
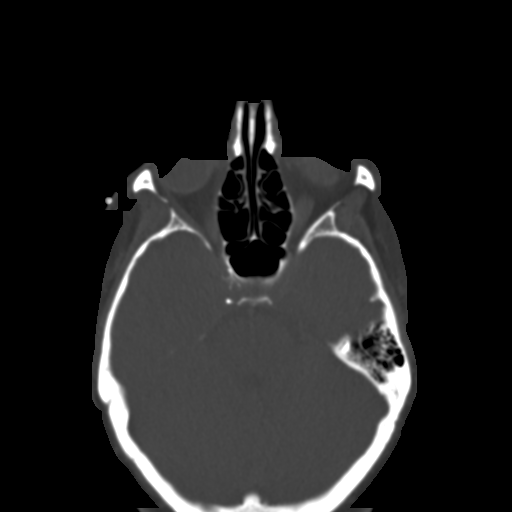
[im 64/85  bone]
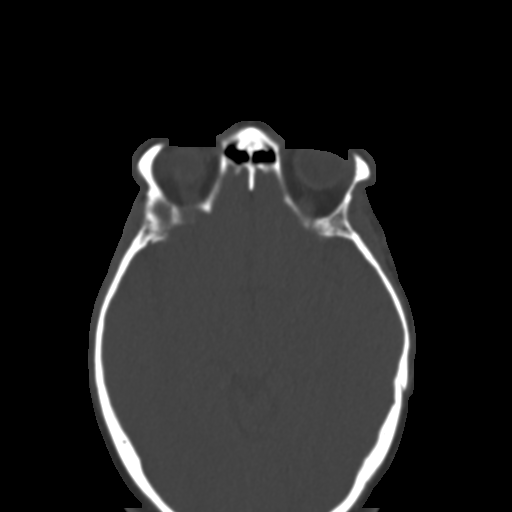
[im 70/85  brain]
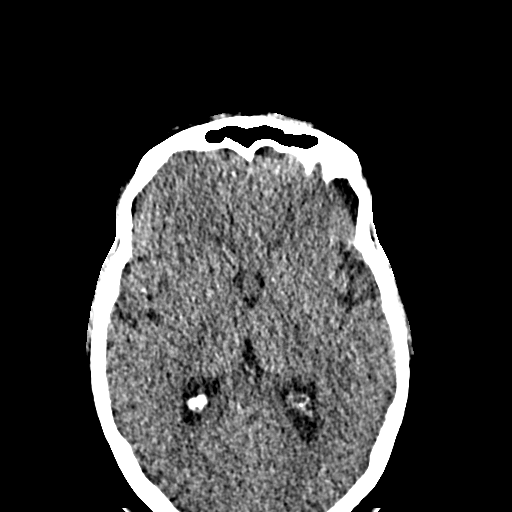
[im 70/85  bone]
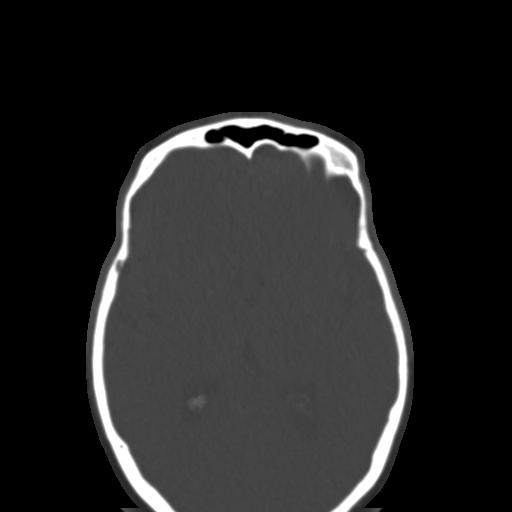
[im 79/85  bone]
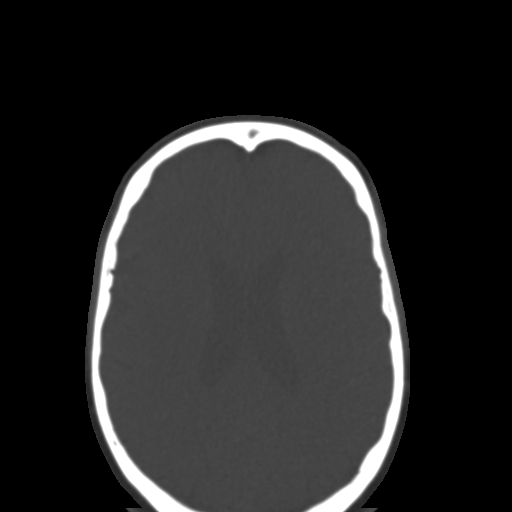

[Series 6: coronal soft · coronal · 0.39mm/px · 3 of 98 slices shown]
[im 33/98  bone]
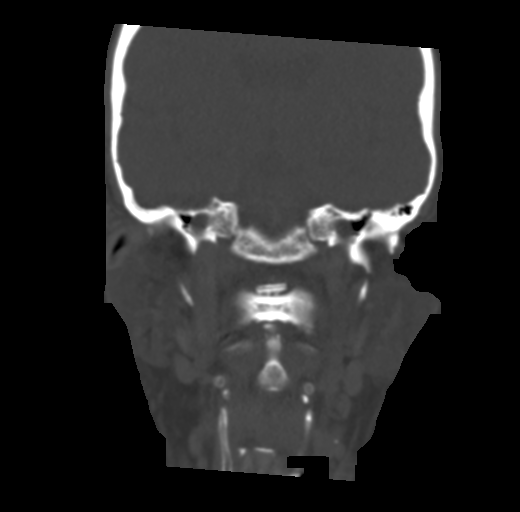
[im 44/98  bone]
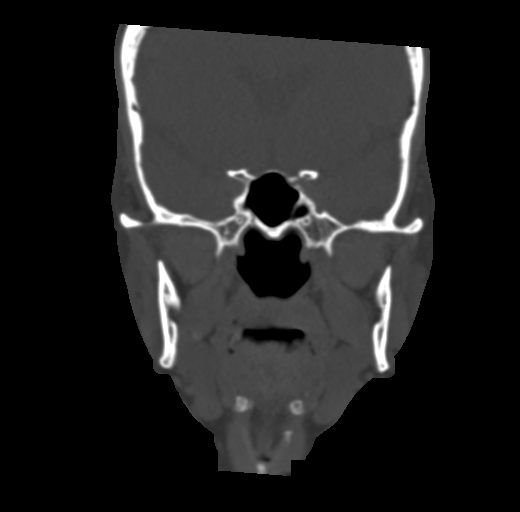
[im 54/98  bone]
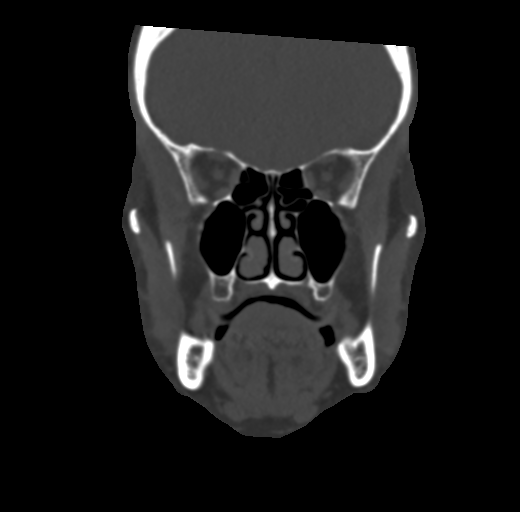

[Series 7: sagittal soft · sagittal · 0.40mm/px · 3 of 93 slices shown]
[im 31/93  bone]
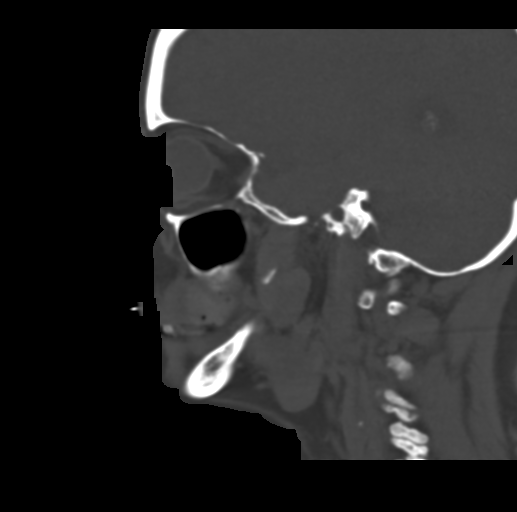
[im 47/93  bone]
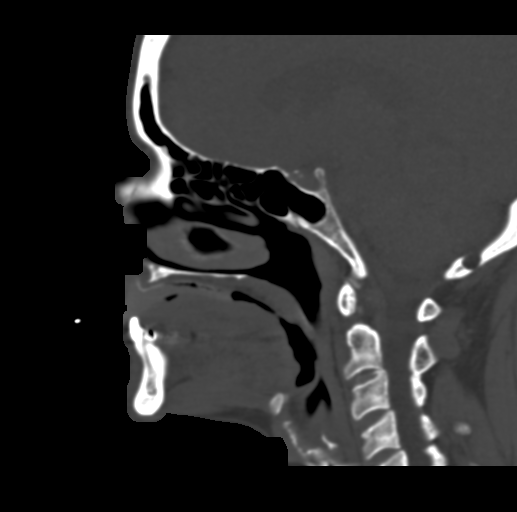
[im 62/93  bone]
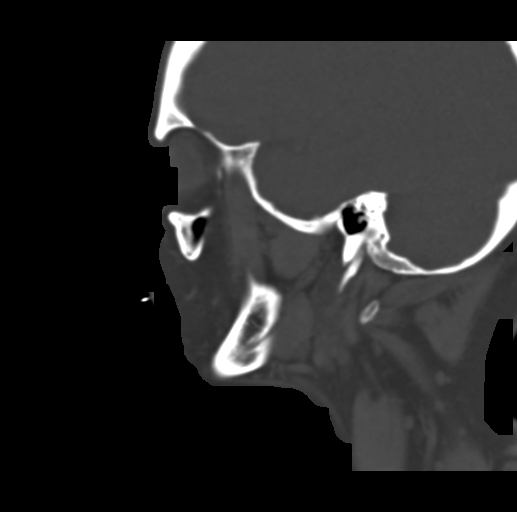

[16 of 47 positions shown; findings below may reference images not displayed]

FINDINGS: CT HEAD FINDINGS

Brain: There is no acute intracranial hemorrhage, mass effect, or
edema. Gray-white differentiation is preserved. No extra-axial
collection.

Vascular: There is intracranial atherosclerotic calcification at the
skull base.

Skull: Unremarkable.

Other: Mastoid air cells are clear.

CT MAXILLOFACIAL FINDINGS

Osseous: Unremarkable.

Orbits: Unremarkable.

Sinuses: Trace mucosal thickening. Drainage pathways are patent.
Nasal septum is near midline.

Soft tissues: Calcified plaque at the left greater than right ICA
origins. Partially imaged metallic artifact adjacent to the left
thyroid cartilage.
IMPRESSION: No acute intracranial abnormality.

No significant paranasal sinus inflammatory changes.

## 2023-10-13 ENCOUNTER — Encounter: Payer: Self-pay | Admitting: Cardiology

## 2023-10-13 ENCOUNTER — Ambulatory Visit: Attending: Cardiology | Admitting: Cardiology

## 2023-10-13 VITALS — BP 130/84 | HR 87 | Ht 60.0 in | Wt 148.8 lb

## 2023-10-13 DIAGNOSIS — I471 Supraventricular tachycardia, unspecified: Secondary | ICD-10-CM | POA: Diagnosis not present

## 2023-10-13 DIAGNOSIS — I25119 Atherosclerotic heart disease of native coronary artery with unspecified angina pectoris: Secondary | ICD-10-CM

## 2023-10-13 DIAGNOSIS — E039 Hypothyroidism, unspecified: Secondary | ICD-10-CM

## 2023-10-13 DIAGNOSIS — I2510 Atherosclerotic heart disease of native coronary artery without angina pectoris: Secondary | ICD-10-CM

## 2023-10-13 DIAGNOSIS — I209 Angina pectoris, unspecified: Secondary | ICD-10-CM

## 2023-10-13 NOTE — Patient Instructions (Addendum)

## 2023-10-13 NOTE — Progress Notes (Signed)
 Cardiology Office Note:    Date:  10/13/2023   ID:  Jasmine Hahn, DOB Jun 18, 1960, MRN [id number]  PCP:  Gable Cambric, MD  Cardiologist:  Lamar Fitch, MD    Referring MD: Gable Cambric, MD   No chief complaint on file.   History of Present Illness:    Jasmine Hahn is a 63 y.o. female past medical history significant for PVCs, palpitations, atypical chest pain, cardiac catheterization done years ago showed nonobstructive disease, stress test done showed no evidence of ischemia comes today to months for follow-up overall seems to be doing well.  Denies have any chest pain tightness squeezing pressure mid chest.  Obstructive.  Because she gained some weight.  Past Medical History:  Diagnosis Date   Adenomatous colon polyp    Aftercare following right knee joint replacement surgery 09/01/2017   ALLERGIC RHINITIS, SEASONAL 06/26/2006   Qualifier: Diagnosis of  By: Amon MD, Aloysius BRAVO.    Allergy    Angina pectoris 07/03/2021   Arthritis    Asthma    Atypical chest pain 04/27/2018   CAD (coronary artery disease) 08/05/2021   Cervical cancer (HCC)    Chronic constipation 07/13/2019   COVID-19 02/25/2019   Discoid lupus    Diverticulosis    Dyspnea on exertion 07/03/2021   FATIGUE 08/23/2007   Qualifier: Diagnosis of  By: Antonio ROSALEA Rockers     Former tobacco use 07/29/2017   Frontal headache 01/31/2021   GERD 06/26/2006   Qualifier: Diagnosis of  By: Amon MD, Aloysius BRAVO.    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    Syncope    not sure why   Thyroid  disease    hypo   URI 08/23/2007   Qualifier: Diagnosis of   By: Antonio ROSALEA Rockers      Replacing diagnoses that were inactivated after the 04/21/22 regulatory import     Wears partial dentures     Past Surgical History:  Procedure Laterality Date   ABDOMINAL HYSTERECTOMY     bone spurs     feet-both feet   CHOLECYSTECTOMY     COLONOSCOPY  08/11/2019   Dr Luis. No neoplasia. Diverticulosis of colon   COLONOSCOPY  06/02/2014   Colon polyp status post polypectomy. Internal hemorrhoids   ELBOW SURGERY     Dr Dorean   ESOPHAGOGASTRODUODENOSCOPY  02/2011   Medoff, NEG   ESOPHAGOGASTRODUODENOSCOPY  08/11/2019   Dr Luis. Normal EGD examination   ESOPHAGOGASTRODUODENOSCOPY  06/02/2014   Mid gastritis. Otherwise normal EGD   KNEE ARTHROSCOPY WITH DRILLING/MICROFRACTURE Right 05/25/2014   Procedure: KNEE ARTHROSCOPY WITH MICROFRACTURE PATELLA FEMORAL CONDYLE;  Surgeon: Toribio Chancy, MD;  Location: [facility];  Service: Orthopedics;  Laterality: Right;   KNEE ARTHROSCOPY WITH MEDIAL MENISECTOMY Right 05/25/2014   Procedure: RIGHT KNEE ARTHROSCOPY  WITH PARTIAL MEDIAL;  Surgeon: Toribio Chancy, MD;  Location: [facility];  Service: Orthopedics;  Laterality: Right;   LEFT HEART CATH AND CORONARY ANGIOGRAPHY N/A 07/05/2021   Procedure: LEFT HEART CATH AND CORONARY ANGIOGRAPHY;  Surgeon: Burnard Debby LABOR, MD;  [facility]: [facility];  Service: Cardiovascular;  Laterality: N/A;   LYSIS OF ADHESION Right 05/25/2014   Procedure: LYSIS OF ADHESION;  Surgeon: Toribio Chancy, MD;  Location: [facility];  Service: Orthopedics;  Laterality: Right;   MOHS SURGERY     OOPHORECTOMY     PARTIAL KNEE ARTHROPLASTY Right 04/17/2016   Procedure:  RIGHT KNEE UNICOMPARTMENTAL ARTHROPLASTY;  Surgeon: Toribio JULIANNA Chancy, MD;  Location: [facility];  Service: Orthopedics;  Laterality: Right;   RECTOCELE REPAIR     REPLACEMENT TOTAL KNEE Right    Skin removed on L leg     THYROIDECTOMY     x 2 surgeries - partial first time   TUBAL LIGATION      Current Medications: Current Meds  Medication Sig   Acetylcysteine (NAC 600 PO) Take by mouth daily.   Ascorbic Acid (VITAMIN C PO) Take by mouth daily.   aspirin  EC 81 MG tablet Take 1 tablet (81 mg total) by mouth daily. Swallow whole.   BIOTIN PO Take 10,000 mcg by mouth daily.   budesonide  (PULMICORT ) 0.5 MG/2ML nebulizer solution Perform nasal rinse 2 times daily   Cholecalciferol (VITAMIN D -3 PO) Take by mouth daily.   cyproheptadine  (PERIACTIN ) 4 MG tablet Take one-half to one tablet by mouth every night as directed.   Docusate Calcium (STOOL SOFTENER PO) Take by mouth at bedtime.   famotidine  (PEPCID ) 20 MG tablet Take 20 mg by mouth at bedtime.   Flax OIL Take 15 mLs by mouth daily. Flax seed oil/ Mix with Thrive by le-Val shake   levothyroxine  (SYNTHROID ) 125 MCG tablet Take 125 mcg by mouth daily before breakfast.   linaclotide  (LINZESS ) 145 MCG CAPS capsule Take 1 capsule (145 mcg total) by mouth daily before breakfast.   Magnesium Hydroxide (DULCOLAX PO) Take by mouth at bedtime.   metoprolol  succinate (TOPROL -XL) 25 MG 24 hr tablet Take 1 tablet by mouth once daily   montelukast (SINGULAIR) 10 MG tablet TAKE 1 TABLET BY MOUTH ONCE DAILY AT BEDTIME FOR ALLERGIES/ASTHMA   Multiple Vitamins-Minerals (CENTRUM SILVER ADULT 50+ PO) Take 1 tablet by mouth daily.   nitroGLYCERIN  (NITROSTAT ) 0.4 MG SL tablet Place 0.4 mg under the tongue every 5 (five) minutes as needed for chest pain.   Nutritional Supplements (NUTRITIONAL SUPPLEMENT PO) Take by mouth. Supplement for muscle spasms   Nutritional Supplements (NUTRITIONAL SUPPLEMENT PO) Take by mouth. Curamed   pantoprazole   (PROTONIX ) 40 MG tablet Take 1 tablet (40 mg total) by mouth daily.   polyethylene glycol powder (GLYCOLAX/MIRALAX) 17 GM/SCOOP powder Take 17 g by mouth daily. Mix with Thrive   pseudoephedrine (SUDAFED) 120 MG 12 hr tablet Take 120 mg by mouth 2 (two) times daily.   Quercetin 500 MG CAPS Take 500 mg by mouth daily. Plus   rosuvastatin (CRESTOR) 10 MG tablet Take 1 tablet by mouth at bedtime.   RYALTRIS  665-25 MCG/ACT SUSP Use two sprays in each nostril twice daily.   triamcinolone  cream (KENALOG) 0.1 % Apply topically.   valACYclovir (VALTREX) 1000 MG tablet Take 1,000 mg by mouth daily as needed.   vitamin B-12 (CYANOCOBALAMIN ) 1000 MCG tablet Take 1,000 mcg by mouth daily.   VIVELLE-DOT 0.1 MG/24HR patch  Place 1 patch onto the skin 2 (two) times a week.    zinc gluconate 50 MG tablet Take 50 mg by mouth at bedtime.   zolpidem  (AMBIEN ) 10 MG tablet Take 10 mg by mouth at bedtime.      Allergies:   Dog epithelium (canis lupus familiaris), Pollen extract, Uncaria tomentosa (cats claw), Prednisone , Cat dander, and Strattera [atomoxetine hcl]   Social History   Socioeconomic History   Marital status: Single    Spouse name: Not on file   Number of children: 4   Years of education: Not on file   Highest education level: Not on file  Occupational History   Occupation: Secondary school teacher  Tobacco Use   Smoking status: Former    Current packs/day: 0.00    Average packs/day: 0.5 packs/day for 30.0 years (15.0 ttl pk-yrs)    Types: Cigarettes    Start date: 05/20/1981    Quit date: 05/21/2011    Years since quitting: 12.4   Smokeless tobacco: Never  Vaping Use   Vaping status: Never Used  Substance and Sexual Activity   Alcohol use: Never   Drug use: Not Currently    Types: Marijuana   Sexual activity: Not on file  Other Topics Concern   Not on file  Social History Narrative   Not on file   Social Drivers of Health   Financial Resource Strain: Not on file  Food Insecurity: Not on  file  Transportation Needs: Not on file  Physical Activity: Not on file  Stress: Not on file  Social Connections: Unknown (05/31/2021)   Received from [employer]   Social Network    Social Network: Not on file     Family History: The patient's family history includes COPD in her maternal grandfather; Cancer - Colon in her brother; Colon cancer (age of onset: 27) in her brother; Colon polyps in her daughter and mother; Diabetes in her mother; Esophageal cancer in her brother and father; Heart attack in her mother; Heart disease in her maternal grandmother and mother; Hyperlipidemia in her brother and mother; Hypertension in her brother and mother; Hypothyroidism in her mother; Lung cancer in her brother and father; Stroke in her mother; Throat cancer in her father. There is no history of Rectal cancer or Stomach cancer. ROS:   Please see the history of present illness.    All 14 point review of systems negative except as described per history of present illness  EKGs/Labs/Other Studies Reviewed:         Recent Labs: 11/06/2022: ALT 21; BUN 9; Creatinine, Ser 0.65; Hemoglobin 14.2; Platelets 346.0; Potassium 3.8; Sodium 138  Recent Lipid Panel    Component Value Date/Time   CHOL 174 01/25/2014 1722   TRIG 252 (H) 01/25/2014 1722   HDL 40 01/25/2014 1722   CHOLHDL 4.4 01/25/2014 1722   VLDL 50 (H) 01/25/2014 1722   LDLCALC 84 01/25/2014 1722    Physical Exam:    VS:  BP 130/84   Pulse 87   Ht 5' (1.524 m)   Wt 148 lb 12.8 oz (67.5 kg)   SpO2 97%   BMI 29.06 kg/m     Wt Readings from Last 3 Encounters:  10/13/23 148 lb 12.8 oz (67.5 kg)  05/12/23 145 lb 9.6 oz (66 kg)  12/09/22 147 lb 12.8 oz (67 kg)     GEN:  Well nourished, well developed in no acute distress HEENT: Normal NECK: No JVD; No carotid bruits LYMPHATICS: No lymphadenopathy CARDIAC: RRR,  no murmurs, no rubs, no gallops RESPIRATORY:  Clear to auscultation without rales, wheezing or rhonchi  ABDOMEN:  Soft, non-tender, non-distended MUSCULOSKELETAL:  No edema; No deformity  SKIN: Warm and dry LOWER EXTREMITIES: no swelling NEUROLOGIC:  Alert and oriented x 3 PSYCHIATRIC:  Normal affect   ASSESSMENT:    1. Coronary artery disease involving native coronary artery of native heart without angina pectoris   2. Angina pectoris   3. SVT (supraventricular tachycardia)   4. Acquired hypothyroidism    PLAN:    In order of problems listed above:  Coronary disease stable from that point review denies have any signs and symptoms that would indicate her problem. Angina pectoris denies having any. SVT controlled denies have any palpitation. Hypothyroidism on appropriate medications   Medication Adjustments/Labs and Tests Ordered: Current medicines are reviewed at length with the patient today.  Concerns regarding medicines are outlined above.  No orders of the defined types were placed in this encounter.  Medication changes: No orders of the defined types were placed in this encounter.   Signed, Lamar DOROTHA Fitch, MD, [facility] 10/13/2023 4:55 PM    [facility]

## 2023-10-26 ENCOUNTER — Other Ambulatory Visit: Payer: Self-pay | Admitting: Gastroenterology

## 2023-10-27 DIAGNOSIS — E559 Vitamin D deficiency, unspecified: Secondary | ICD-10-CM | POA: Diagnosis not present

## 2023-10-27 DIAGNOSIS — Z6829 Body mass index (BMI) 29.0-29.9, adult: Secondary | ICD-10-CM | POA: Diagnosis not present

## 2023-10-27 DIAGNOSIS — Z0000 Encounter for general adult medical examination without abnormal findings: Secondary | ICD-10-CM | POA: Diagnosis not present

## 2023-10-28 ENCOUNTER — Ambulatory Visit: Admitting: Allergy and Immunology

## 2023-10-28 ENCOUNTER — Encounter: Payer: Self-pay | Admitting: Allergy and Immunology

## 2023-10-28 VITALS — BP 118/80 | HR 80 | Resp 18

## 2023-10-28 DIAGNOSIS — G43909 Migraine, unspecified, not intractable, without status migrainosus: Secondary | ICD-10-CM | POA: Diagnosis not present

## 2023-10-28 DIAGNOSIS — G500 Trigeminal neuralgia: Secondary | ICD-10-CM | POA: Diagnosis not present

## 2023-10-28 DIAGNOSIS — G4720 Circadian rhythm sleep disorder, unspecified type: Secondary | ICD-10-CM

## 2023-10-28 DIAGNOSIS — J3089 Other allergic rhinitis: Secondary | ICD-10-CM | POA: Diagnosis not present

## 2023-10-28 NOTE — Progress Notes (Unsigned)
 [place] [facility] [place] - [place] - [place] - [place]   Follow-up Note  Referring Provider: Gable Cambric, MD Primary Provider: Gable Cambric, MD Date of Office Visit: 10/28/2023  Subjective:   Jasmine Hahn (DOB: 1960-11-22) is a 63 y.o. female who [facility] in re-evaluation of the following:  HPI: Jasmine Hahn returns to this clinic in evaluation of facial pain syndrome/headache, sleep dysfunction, rhinitis.  I last saw her in this clinic 05 August 2023.    She has had significant improvement on her current plan which includes a nasal steroid/antihistamine combination spray on a consistent basis and the use of Periactin  in the evening.  She has had minimal headache.  Her sleep is doing much better at this point in time.  She still continues to use Ambien  at night.  She has had very little issue with her upper airway.  Allergies as of 10/28/2023       Reactions   Dog Epithelium (canis Lupus Familiaris) Other (See Comments)   Other reaction(s): Eye Swelling, Wheezing   Pollen Extract Other (See Comments), Tinitus   Other reaction(s): Headache, Other   Uncaria Tomentosa (cats Claw)    Other reaction(s): eye redness, eye swelling, respiratory distress   Prednisone  Other (See Comments)   Causes patient to gain weight   Cat Dander Other (See Comments)   Eye Redness, Eye Swelling, Respiratory Distress   Strattera [atomoxetine Hcl] Hives        Medication List    aspirin  EC 81 MG tablet Take 1 tablet (81 mg total) by mouth daily. Swallow whole.   BIOTIN PO Take 10,000 mcg by mouth daily.   budesonide  0.5 MG/2ML nebulizer solution Commonly known as: PULMICORT  Perform nasal rinse 2 times daily   CENTRUM SILVER ADULT 50+ PO Take 1 tablet by mouth daily.   cyanocobalamin  1000 MCG tablet Commonly known as: VITAMIN B12 Take 1,000 mcg by mouth daily.   cyproheptadine  4 MG tablet Commonly known as: PERIACTIN  Take one-half  to one tablet by mouth every night as directed.   DULCOLAX PO Take by mouth at bedtime.   famotidine  20 MG tablet Commonly known as: PEPCID  Take 20 mg by mouth at bedtime.   Flax Oil Take 15 mLs by mouth daily. Flax seed oil/ Mix with Thrive by le-Val shake   levothyroxine  125 MCG tablet Commonly known as: SYNTHROID  Take 125 mcg by mouth daily before breakfast.   linaclotide  145 MCG Caps capsule Commonly known as: Linzess  Take 1 capsule (145 mcg total) by mouth daily before breakfast. Please call [phone] to schedule an office visit for more refills   metoprolol  succinate 25 MG 24 hr tablet Commonly known as: TOPROL -XL Take 1 tablet by mouth once daily   montelukast 10 MG tablet Commonly known as: SINGULAIR TAKE 1 TABLET BY MOUTH ONCE DAILY AT BEDTIME FOR ALLERGIES/ASTHMA   NAC 600 PO Take by mouth daily.   nitroGLYCERIN  0.4 MG SL tablet Commonly known as: NITROSTAT  Place 0.4 mg under the tongue every 5 (five) minutes as needed for chest pain.   NUTRITIONAL SUPPLEMENT PO Take by mouth. Supplement for muscle spasms   NUTRITIONAL SUPPLEMENT PO Take by mouth. Curamed   pantoprazole  40 MG tablet Commonly known as: PROTONIX  Take 1 tablet (40 mg total) by mouth daily. Please call [phone] to schedule an office visit for more refills   polyethylene glycol powder 17 GM/SCOOP powder Commonly known as: GLYCOLAX/MIRALAX Take 17 g by mouth daily. Mix with  Thrive   pseudoephedrine 120 MG 12 hr tablet Commonly known as: SUDAFED Take 120 mg by mouth 2 (two) times daily.   Quercetin 500 MG Caps Take 500 mg by mouth daily. Plus   rosuvastatin 10 MG tablet Commonly known as: CRESTOR Take 1 tablet by mouth at bedtime.   Ryaltris  665-25 MCG/ACT Susp Generic drug: Olopatadine-Mometasone  Use two sprays in each nostril twice daily.   STOOL SOFTENER PO Take by mouth at bedtime.   triamcinolone  cream 0.1 % Commonly known as: KENALOG Apply topically.    valACYclovir 1000 MG tablet Commonly known as: VALTREX Take 1,000 mg by mouth daily as needed.   VITAMIN C PO Take by mouth daily.   VITAMIN D -3 PO Take by mouth daily.   Vivelle-Dot 0.1 MG/24HR patch Generic drug: estradiol Place 1 patch onto the skin 2 (two) times a week.   zinc gluconate 50 MG tablet Take 50 mg by mouth at bedtime.   zolpidem  10 MG tablet Commonly known as: AMBIEN  Take 10 mg by mouth at bedtime.    Past Medical History:  Diagnosis Date   Adenomatous colon polyp    Aftercare following right knee joint replacement surgery 09/01/2017   ALLERGIC RHINITIS, SEASONAL 06/26/2006   Qualifier: Diagnosis of  By: Amon MD, Aloysius BRAVO.    Allergy    Angina pectoris 07/03/2021   Arthritis    Asthma    Atypical chest pain 04/27/2018   CAD (coronary artery disease) 08/05/2021   Cervical cancer (HCC)    Chronic constipation 07/13/2019   COVID-19 02/25/2019   Discoid lupus    Diverticulosis    Dyspnea on exertion 07/03/2021   FATIGUE 08/23/2007   Qualifier: Diagnosis of  By: Antonio ROSALEA Rockers     Former tobacco use 07/29/2017   Frontal headache 01/31/2021   GERD 06/26/2006   Qualifier: Diagnosis of  By: Amon MD, Aloysius BRAVO.    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    Syncope    not sure why   Thyroid  disease    hypo   URI  08/23/2007   Qualifier: Diagnosis of   By: Antonio ROSALEA Rockers      Replacing diagnoses that were inactivated after the 04/21/22 regulatory import     Wears partial dentures     Past Surgical History:  Procedure Laterality Date   ABDOMINAL HYSTERECTOMY     bone spurs     feet-both feet   CHOLECYSTECTOMY     COLONOSCOPY  08/11/2019   Dr Luis. No neoplasia. Diverticulosis of colon   COLONOSCOPY  06/02/2014   Colon polyp status post polypectomy. Internal hemorrhoids   ELBOW SURGERY     Dr Dorean   ESOPHAGOGASTRODUODENOSCOPY  02/2011   Medoff, NEG   ESOPHAGOGASTRODUODENOSCOPY  08/11/2019   Dr Luis. Normal EGD examination  ESOPHAGOGASTRODUODENOSCOPY  06/02/2014   Mid gastritis. Otherwise normal EGD   KNEE ARTHROSCOPY WITH DRILLING/MICROFRACTURE Right 05/25/2014   Procedure: KNEE ARTHROSCOPY WITH MICROFRACTURE PATELLA FEMORAL CONDYLE;  Surgeon: Toribio Chancy, MD;  Location: [facility];  Service: Orthopedics;  Laterality: Right;   KNEE ARTHROSCOPY WITH MEDIAL MENISECTOMY Right 05/25/2014   Procedure: RIGHT KNEE ARTHROSCOPY WITH PARTIAL MEDIAL;  Surgeon: Toribio Chancy, MD;  Location: [facility];  Service: Orthopedics;  Laterality: Right;   LEFT HEART CATH AND CORONARY ANGIOGRAPHY N/A 07/05/2021   Procedure: LEFT HEART CATH AND CORONARY ANGIOGRAPHY;  Surgeon: Burnard Debby LABOR, MD;  [facility];  Service: Cardiovascular;  Laterality: N/A;   LYSIS OF ADHESION Right 05/25/2014   Procedure: LYSIS OF ADHESION;  Surgeon: Toribio Chancy, MD;  Location: [facility];  Service: Orthopedics;  Laterality: Right;   MOHS SURGERY     OOPHORECTOMY     PARTIAL KNEE ARTHROPLASTY Right 04/17/2016   Procedure: RIGHT KNEE UNICOMPARTMENTAL ARTHROPLASTY;  Surgeon: Toribio JULIANNA Chancy, MD;  Location:  SUR[facility]ERY [facility];  Service: Orthopedics;  Laterality: Right;   RECTOCELE REPAIR     REPLACEMENT TOTAL KNEE Right    Skin removed on L leg      THYROIDECTOMY     x 2 surgeries - partial first time   TUBAL LIGATION      Review of systems negative except as noted in HPI / PMHx or noted below:  Review of Systems  Constitutional: Negative.   HENT: Negative.    Eyes: Negative.   Respiratory: Negative.    Cardiovascular: Negative.   Gastrointestinal: Negative.   Genitourinary: Negative.   Musculoskeletal: Negative.   Skin: Negative.   Neurological: Negative.   Endo/Heme/Allergies: Negative.   Psychiatric/Behavioral: Negative.       Objective:   There were no vitals filed for this visit.        Physical Exam Constitutional:      Appearance: She is not diaphoretic.  HENT:     Head: Normocephalic.     Right Ear: Tympanic membrane, ear canal and external ear normal.     Left Ear: Tympanic membrane, ear canal and external ear normal.     Nose: Nose normal. No mucosal edema or rhinorrhea.     Mouth/Throat:     Pharynx: Uvula midline. No oropharyngeal exudate.  Eyes:     Conjunctiva/sclera: Conjunctivae normal.  Neck:     Thyroid : No thyromegaly.     Trachea: Trachea normal. No tracheal tenderness or tracheal deviation.  Cardiovascular:     Rate and Rhythm: Normal rate and regular rhythm.     Heart sounds: Normal heart sounds, S1 normal and S2 normal. No murmur heard. Pulmonary:     Effort: No respiratory distress.     Breath sounds: Normal breath sounds. No stridor. No wheezing or rales.  Lymphadenopathy:     Head:     Right side of head: No tonsillar adenopathy.     Left side of head: No tonsillar adenopathy.     Cervical: No cervical adenopathy.  Skin:    Findings: No erythema or rash.     Nails: There is no clubbing.  Neurological:     Mental Status: She is alert.     Diagnostics: none  Assessment and Plan:   1. Facial pain syndrome   2. Migraine syndrome   3. Dysfunction of sleep stage or arousal   4. Perennial allergic rhinitis    1. Treat and prevent upper  airway inflammation:   A. Ryaltris   - 2 sprays each nostril 1-2 times per day  2. Treat and prevent headache:   A. Always minimize caffeine and chocolate consumption  3. Treat and prevent sleep dysfunction:   A. Attempt to taper Ambien    B. Continue Periactin  4 ng - 1/2 - 1 tablet at bedtime  C. Always minimize caffeine and chocolate consumption  4. If needed:   A. 12 hour sudafed sinus 2 times per day  B. Nasal saline  5. Return to clinic in 1 year or earlier if problem  6. Influenza = Tamiflu. Covid = paxlovid  Jasmine Hahn is doing very well.  She needs to work through the issue of medication dosing.  She can use her combination nasal steroid/antihistamine spray 1 or 2 times per day and she can decide on the dose of Periactin  that gives her benefit and she can attempt to taper off her Ambien .  I do not think there is a need for her to be seen in this clinic on a regular basis but I will certainly be available should there be a problem as she moves forward.  Jasmine Denis, MD Allergy / Immunology [facility]

## 2023-10-28 NOTE — Patient Instructions (Signed)
  1. Treat and prevent upper airway inflammation:   A. Ryaltris  - 2 sprays each nostril 1-2 times per day  2. Treat and prevent headache:   A. Always minimize caffeine and chocolate consumption  3. Treat and prevent sleep dysfunction:   A. Attempt to taper Ambien    B. Continue Periactin  4 ng - 1/2 - 1 tablet at bedtime  C. Always minimize caffeine and chocolate consumption  4. If needed:   A. 12 hour sudafed sinus 2 times per day  B. Nasal saline  5. Return to clinic in 1 year or earlier if problem  6. Influenza = Tamiflu. Covid = paxlovid

## 2023-10-29 ENCOUNTER — Encounter: Payer: Self-pay | Admitting: Allergy and Immunology

## 2023-11-06 DIAGNOSIS — R92333 Mammographic heterogeneous density, bilateral breasts: Secondary | ICD-10-CM | POA: Diagnosis not present

## 2023-11-06 DIAGNOSIS — N959 Unspecified menopausal and perimenopausal disorder: Secondary | ICD-10-CM | POA: Diagnosis not present

## 2023-11-06 DIAGNOSIS — N6012 Diffuse cystic mastopathy of left breast: Secondary | ICD-10-CM | POA: Diagnosis not present

## 2023-11-06 DIAGNOSIS — N6321 Unspecified lump in the left breast, upper outer quadrant: Secondary | ICD-10-CM | POA: Diagnosis not present

## 2023-11-06 DIAGNOSIS — R928 Other abnormal and inconclusive findings on diagnostic imaging of breast: Secondary | ICD-10-CM | POA: Diagnosis not present

## 2023-11-25 DIAGNOSIS — A499 Bacterial infection, unspecified: Secondary | ICD-10-CM | POA: Diagnosis not present

## 2023-12-14 DIAGNOSIS — S83412A Sprain of medial collateral ligament of left knee, initial encounter: Secondary | ICD-10-CM | POA: Diagnosis not present

## 2023-12-22 ENCOUNTER — Ambulatory Visit: Admitting: Obstetrics and Gynecology

## 2023-12-22 ENCOUNTER — Encounter: Payer: Self-pay | Admitting: Obstetrics and Gynecology

## 2023-12-22 VITALS — BP 132/82 | HR 96 | Ht 61.2 in | Wt 152.3 lb

## 2023-12-22 DIAGNOSIS — N398 Other specified disorders of urinary system: Secondary | ICD-10-CM | POA: Insufficient documentation

## 2023-12-22 DIAGNOSIS — M62838 Other muscle spasm: Secondary | ICD-10-CM | POA: Diagnosis not present

## 2023-12-22 LAB — POCT URINALYSIS DIP (CLINITEK)
Bilirubin, UA: NEGATIVE
Blood, UA: NEGATIVE
Glucose, UA: NEGATIVE mg/dL
Ketones, POC UA: NEGATIVE mg/dL
Leukocytes, UA: NEGATIVE
Nitrite, UA: NEGATIVE
POC PROTEIN,UA: NEGATIVE
Spec Grav, UA: 1.01 (ref 1.010–1.025)
Urobilinogen, UA: 0.2 U/dL
pH, UA: 6 (ref 5.0–8.0)

## 2023-12-22 MED ORDER — DIAZEPAM 5 MG PO TABS: ORAL_TABLET | ORAL | 0 refills | Status: AC

## 2023-12-22 NOTE — Assessment & Plan Note (Signed)
-   The origin of pelvic floor muscle spasm can be multifactorial, including primary, reactive to a different pain source, trauma, or even part of a centralized pain syndrome.Treatment options include pelvic floor physical therapy, local (vaginal) or oral  muscle relaxants, pelvic muscle trigger point injections or centrally acting pain medications.   - Start with pelvic PT and vaginal valium

## 2023-12-22 NOTE — Progress Notes (Signed)
 New Patient Evaluation and Consultation  Referring Provider: Marget Lenis, MD PCP: Gable Cambric, MD Date of Service: 12/22/2023  SUBJECTIVE Chief Complaint: New Patient (Initial Visit) Jasmine Hahn is a 63 y.o. female is here for urinary retention.)  History of Present Illness: Jasmine Hahn is a 63 y.o. White or Caucasian female seen in consultation at the request of Dr Marget for evaluation of incomplete bladder emptying.    Urinary Symptoms: Leaks urine with cough/ sneeze Does not happen every day Pad use: none Patient is not bothered by UI symptoms.  Day time voids 5+.  Nocturia: 0 times per night to void. Voiding dysfunction:  does not empty bladder well.  Patient does not use a catheter to empty bladder.  When urinating, patient feels a weak stream, dribbling after finishing, and to push on her belly or vagina to empty bladder She felt she had a perfect bladder prior to her prolapse surgery. She reports she needed a catheter for about 10 days due to urinary retention. Now, feels like urine sprays instead of going straight down. Now, she feels she is emptying better than 6 months ago.  Drinks- 7-8 cups water only  UTIs: 0 UTI's in the last year.   Denies history of blood in urine and kidney or bladder stones  Pelvic Organ Prolapse Symptoms:                  Patient Denies a feeling of a bulge the vaginal area.   She had an A&P repair with SSLF (0-ethibond suture used in ligament), kelly plication, with Dr Marget in 11/2022. Operate note reviewed in media.   Bowel Symptom: Bowel movements: 2-3 time(s) per day Stool consistency: loose Straining: yes.  Splinting: no.  Incomplete evacuation: no.  Patient Denies accidental bowel leakage / fecal incontinence Bowel regimen: stool softener, miralax, and dulcolax- daily  HM Colonoscopy          Upcoming     Colonoscopy (Every 10 Years) Next due on 08/14/2032    08/15/2022  COLONOSCOPY   Only the first 1 history  entries have been loaded, but more history exists.                Sexual Function Sexually active: yes.  Sexual orientation: Straight Pain with sex: No  Pelvic Pain Denies pelvic pain    Past Medical History:  Past Medical History:  Diagnosis Date   Adenomatous colon polyp    Aftercare following right knee joint replacement surgery 09/01/2017   ALLERGIC RHINITIS, SEASONAL 06/26/2006   Qualifier: Diagnosis of  By: Amon MD, Aloysius BRAVO.    Allergy    Angina pectoris 07/03/2021   Arthritis    Asthma    Atypical chest pain 04/27/2018   CAD (coronary artery disease) 08/05/2021   Cervical cancer (HCC)    Chronic constipation 07/13/2019   COVID-19 02/25/2019   Discoid lupus    Diverticulosis    Dyspnea on exertion 07/03/2021   FATIGUE 08/23/2007   Qualifier: Diagnosis of  By: Antonio ROSALEA Rockers     Former tobacco use 07/29/2017   Frontal headache 01/31/2021   GERD 06/26/2006   Qualifier: Diagnosis of  By: Amon MD, Aloysius BRAVO.    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    Syncope    not sure why   Thyroid  disease    hypo   URI 08/23/2007   Qualifier: Diagnosis of   By: Antonio ROSALEA Rockers      Replacing diagnoses that were inactivated after the 04/21/22 regulatory import     Wears partial dentures      Past Surgical History:   Past Surgical  History:  Procedure Laterality Date   ABDOMINAL HYSTERECTOMY     bone spurs     feet-both feet   CHOLECYSTECTOMY     COLONOSCOPY  08/11/2019   Dr Luis. No neoplasia. Diverticulosis of colon   COLONOSCOPY  06/02/2014   Colon polyp status post polypectomy. Internal hemorrhoids   ELBOW SURGERY     Dr Dorean   ESOPHAGOGASTRODUODENOSCOPY  02/2011   Medoff, NEG   ESOPHAGOGASTRODUODENOSCOPY  08/11/2019   Dr Luis. Normal EGD examination   ESOPHAGOGASTRODUODENOSCOPY  06/02/2014   Mid gastritis. Otherwise normal EGD   KNEE ARTHROSCOPY WITH DRILLING/MICROFRACTURE Right 05/25/2014   Procedure: KNEE ARTHROSCOPY WITH MICROFRACTURE PATELLA FEMORAL CONDYLE;  Surgeon: Toribio Chancy, MD;  Location: [facility];  Service: Orthopedics;  Laterality: Right;   KNEE ARTHROSCOPY WITH MEDIAL MENISECTOMY Right 05/25/2014   Procedure: RIGHT KNEE ARTHROSCOPY WITH PARTIAL MEDIAL;  Surgeon: Toribio Chancy, MD;  Location: [facility];  Service: Orthopedics;  Laterality: Right;   LEFT HEART CATH AND CORONARY ANGIOGRAPHY N/A 07/05/2021   Procedure: LEFT HEART CATH AND CORONARY ANGIOGRAPHY;  Surgeon: Burnard Debby LABOR, MD;  Location: [facility];  Service: Cardiovascular;  Laterality: N/A;   LYSIS OF ADHESION Right 05/25/2014   Procedure: LYSIS OF ADHESION;  Surgeon: Toribio Chancy, MD;  Location: [facility];  Service: Orthopedics;  Laterality: Right;   MOHS SURGERY     OOPHORECTOMY     PARTIAL KNEE ARTHROPLASTY Right 04/17/2016   Procedure: RIGHT KNEE UNICOMPARTMENTAL ARTHROPLASTY;  Surgeon: Toribio JULIANNA Chancy, MD;  Location: [facility];  Service: Orthopedics;  Laterality: Right;   RECTOCELE REPAIR  11/2022   REPLACEMENT TOTAL KNEE Right    Skin removed on L leg     THYROIDECTOMY     x 2 surgeries - partial first time   TUBAL LIGATION     VAGINAL PROLAPSE REPAIR     A&P repair, sacrospinous fixation, kelly plication     Past OB/GYN History: OB  History  Gravida Para Term Preterm AB Living  4 4 4   4   SAB IAB Ectopic Multiple Live Births          # Outcome Date GA Lbr Len/2nd Weight Sex Type Anes PTL Lv  4 Term      Vag-Spont     3 Term      Vag-Spont     2 Term      Vag-Forceps     1 Term      Vag-Spont       S/p hysterectomy  Medications: Patient has a current medication list which includes the following prescription(s): acetylcysteine, ascorbic acid, aspirin  ec, biotin,  budesonide , cholecalciferol, cyproheptadine , diazepam , docusate calcium, famotidine , flax, levothyroxine , linaclotide , magnesium hydroxide, metoprolol  succinate, montelukast, multiple vitamins-minerals, nitroglycerin , nutritional supplements, pantoprazole , polyethylene glycol powder, pseudoephedrine, quercetin, rosuvastatin, ryaltris , triamcinolone  cream, valacyclovir, cyanocobalamin , vivelle-dot, zinc gluconate, and zolpidem .   Allergies: Patient is allergic to dog epithelium (canis lupus familiaris), pollen extract, uncaria tomentosa (cats claw), prednisone , cat dander, and strattera [atomoxetine hcl].   Social History:  Social History   Tobacco Use   Smoking status: Former    Current packs/day: 0.00    Average packs/day: 0.5 packs/day for 30.0 years (15.0 ttl pk-yrs)    Types: Cigarettes    Start date: 05/20/1981    Quit date: 05/21/2011    Years since quitting: 12.5   Smokeless tobacco: Never  Vaping Use   Vaping status: Never Used  Substance Use Topics   Alcohol use: Never   Drug use: Not Currently    Types: Marijuana    Relationship status: single Patient lives alone  Patient is employed as a environmental manager. Regular exercise: Yes: very active with line dancing History of abuse: No  Family History:   Family History  Problem Relation Age of Onset   Stroke Mother    Hypertension Mother    Diabetes Mother    Heart disease Mother    Hyperlipidemia Mother    Hypothyroidism Mother    Colon polyps Mother    Heart attack Mother     Esophageal cancer Father    Lung cancer Father    Throat cancer Father    Esophageal cancer Brother    Colon cancer Brother 66   Hypertension Brother    Hyperlipidemia Brother    Cancer - Colon Brother    Lung cancer Brother    Heart disease Maternal Grandmother    COPD Maternal Grandfather    Colon polyps Daughter    Rectal cancer Neg Hx    Stomach cancer Neg Hx      Review of Systems: Review of Systems  Constitutional:  Positive for malaise/fatigue. Negative for fever and weight loss.  Respiratory:  Positive for shortness of breath. Negative for cough and wheezing.   Cardiovascular:  Negative for chest pain, palpitations and leg swelling.  Gastrointestinal:  Positive for abdominal pain. Negative for blood in stool.  Genitourinary:  Negative for dysuria.  Musculoskeletal:  Negative for myalgias.  Skin:  Negative for rash.  Neurological:  Negative for dizziness and headaches.  Endo/Heme/Allergies:  Does not bruise/bleed easily.       + hot flashes  Psychiatric/Behavioral:  Negative for depression. The patient is not nervous/anxious.      OBJECTIVE Physical Exam: Vitals:   12/22/23 0930  BP: 132/82  Pulse: 96  Weight: 152 lb 4.8 oz (69.1 kg)  Height: 5' 1.2 (1.554 m)    Physical Exam Vitals reviewed. Exam conducted with a chaperone present.  Constitutional:      General: She is not in acute distress. Pulmonary:     Effort: Pulmonary effort is normal.  Abdominal:     General: There is no distension.     Palpations: Abdomen is soft.     Tenderness: There is no abdominal tenderness. There is no rebound.  Musculoskeletal:        General: No swelling. Normal range of motion.  Skin:    General: Skin is warm and dry.     Findings: No rash.  Neurological:     Mental Status: She is alert and oriented to person, place, and time.  Psychiatric:  Mood and Affect: Mood normal.        Behavior: Behavior normal.      GU / Detailed Urogynecologic Evaluation:   Pelvic Exam: Normal external female genitalia; Bartholin's and Skene's glands normal in appearance; urethral meatus normal in appearance, no urethral masses or discharge.   CST: negative  s/p hysterectomy: Speculum exam reveals normal vaginal mucosa without  atrophy and normal vaginal cuff.  Adnexa no mass, fullness, tenderness.    Pelvic floor strength I/V, puborectalis III/V external anal sphincter IV/V  Pelvic floor musculature: Right levator tender, Right obturator tender, Left levator tender, Left obturator tender  POP-Q:   POP-Q  -3                                            Aa   -3                                           Ba  -7                                              C   4                                            Gh  4.5                                            Pb  7                                            tvl   -1.5                                            Ap  -1.5                                            Bp                                                 D      Rectal Exam:  Normal sphincter tone, small distal rectocele, enterocoele not present, no rectal masses  Post-Void Residual (PVR) by Bladder Scan: In order to evaluate bladder emptying, we discussed obtaining a postvoid residual and patient agreed to this procedure.  Procedure: The ultrasound unit was placed on the patient's abdomen in the suprapubic region after the patient had voided.    Post  Void Residual - 12/22/23 0949       Post Void Residual   Post Void Residual 21 mL           Laboratory Results: Lab Results  Component Value Date   BILIRUBINUR NEG 01/25/2014   PROTEINUR NEG 01/25/2014   UROBILINOGEN 1 01/25/2014   LEUKOCYTESUR NEG 01/25/2014    Lab Results  Component Value Date   CREATININE 0.65 11/06/2022   CREATININE 0.70 07/03/2021   CREATININE 1.06 (H) 06/26/2021    Lab Results  Component Value Date   HGBA1C 5.4 01/25/2014    Lab Results   Component Value Date   HGB 14.2 11/06/2022     ASSESSMENT AND PLAN Ms. Haralson is a 63 y.o. with:  1. Voiding dysfunction   2. Levator spasm     Voiding dysfunction Assessment & Plan: - normal PVR today - We discussed that this is likely due to high tone pelvic floor dysfunction. Also possible that she had some over correction of suburethral support a the time of surgery.  - We discussed urodynamic testing may give more information but will start with pelvic PT and reassess need for testing.    Levator spasm Assessment & Plan: - The origin of pelvic floor muscle spasm can be multifactorial, including primary, reactive to a different pain source, trauma, or even part of a centralized pain syndrome.Treatment options include pelvic floor physical therapy, local (vaginal) or oral  muscle relaxants, pelvic muscle trigger point injections or centrally acting pain medications.   - Start with pelvic PT and vaginal valium  Orders: -     AMB referral to rehabilitation -     diazePAM; Place 1 tablet vaginally nightly as needed for muscle spasm/ pelvic pain.  Dispense: 30 tablet; Refill: 0  Return 3 months   Jasmine LOISE Caper, MD

## 2023-12-22 NOTE — Assessment & Plan Note (Signed)
-   normal PVR today - We discussed that this is likely due to high tone pelvic floor dysfunction. Also possible that she had some over correction of suburethral support a the time of surgery.  - We discussed urodynamic testing may give more information but will start with pelvic PT and reassess need for testing.

## 2023-12-22 NOTE — Patient Instructions (Signed)
I will prescribe valium 5 mg pills to place vaginally up to 2 times a day for vaginal muscle spasms. Start at night and take one every night for the next several weeks to see if it improves your symptoms. Once you are improving you can taper off the medication and just use as needed. If the medication makes you drowsy then only use at bedtime and/or we can reduce the dose. Do not use gel to place it, as that will prevent the tablet from dissolving.  Instead place 1-2 drops of water on the table before inserting it in the vagina. If the pills do not dissolve well we can switch to a special compounded suppository. Let me know how you are doing on the medication and if you have any questions.     

## 2023-12-30 DIAGNOSIS — S838X2A Sprain of other specified parts of left knee, initial encounter: Secondary | ICD-10-CM | POA: Diagnosis not present

## 2024-01-27 ENCOUNTER — Other Ambulatory Visit: Payer: Self-pay | Admitting: Medical Genetics

## 2024-02-01 ENCOUNTER — Encounter: Payer: Self-pay | Admitting: *Deleted

## 2024-02-04 ENCOUNTER — Other Ambulatory Visit: Payer: Self-pay | Admitting: Gastroenterology

## 2024-02-15 ENCOUNTER — Other Ambulatory Visit

## 2024-02-18 ENCOUNTER — Ambulatory Visit: Admitting: Physical Therapy

## 2024-02-24 ENCOUNTER — Encounter: Payer: Self-pay | Admitting: Allergy and Immunology

## 2024-02-24 ENCOUNTER — Ambulatory Visit: Admitting: Allergy and Immunology

## 2024-02-24 VITALS — BP 126/82 | HR 84 | Temp 98.2°F | Resp 20

## 2024-02-24 DIAGNOSIS — G4720 Circadian rhythm sleep disorder, unspecified type: Secondary | ICD-10-CM

## 2024-02-24 DIAGNOSIS — H6993 Unspecified Eustachian tube disorder, bilateral: Secondary | ICD-10-CM | POA: Diagnosis not present

## 2024-02-24 DIAGNOSIS — G500 Trigeminal neuralgia: Secondary | ICD-10-CM

## 2024-02-24 DIAGNOSIS — B9789 Other viral agents as the cause of diseases classified elsewhere: Secondary | ICD-10-CM

## 2024-02-24 DIAGNOSIS — J988 Other specified respiratory disorders: Secondary | ICD-10-CM

## 2024-02-24 DIAGNOSIS — G43909 Migraine, unspecified, not intractable, without status migrainosus: Secondary | ICD-10-CM | POA: Diagnosis not present

## 2024-02-24 DIAGNOSIS — J3089 Other allergic rhinitis: Secondary | ICD-10-CM

## 2024-02-24 NOTE — Patient Instructions (Addendum)
" °  1. Treat and prevent upper airway inflammation:   A. Ryaltris  - 1-2 sprays each nostril 1-2 times per day  2. Treat and prevent headache:   A. Always minimize caffeine and chocolate consumption  3. Treat and prevent sleep dysfunction:   A. Continue Periactin  4 mg - 1/2 - 1 tablet at bedtime  B. Always minimize caffeine and chocolate consumption  4. If needed:   A. 12 hour sudafed sinus 1=2 times per day  B. Nasal saline  5. For this recent event:   A. Use nasal saline a few times per day  B. Prednisone  10 mg - 1 tablet 1 time per day for 3-5 days  6. Return to clinic in 1 year or earlier if problem  7. Influenza = Tamiflu. Covid = paxlovid "

## 2024-02-24 NOTE — Progress Notes (Unsigned)
 "  [place] [facility] [place] [facility] [place] [facility] [place] [facility] [place]   Follow-up Note  Referring Provider: Gable Cambric, Hahn Primary Provider: Gable Cambric, Hahn Date of Office Visit: 02/24/2024  Subjective:   Jasmine Hahn (DOB: 03-13-1960) is a 64 y.o. 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  I last saw her in this clinic 28 October 2023.  She had done quite well with her facial pain syndrome/headache and sleep dysfunction and rhinitis since have last seen her in this clinic.  She was using Periactin  at 2 mg at bedtime and still continued to use Ambien  and she felt that her sleep quality was very good and she really was not having any headaches.  She would use her combination nasal steroid nasal antihistamine most days and she felt like her upper airway was doing very well.  She did not consume any caffeine or chocolate during this timeframe.  But approximately 1 week ago for a little bit longer she was having headaches and they were pretty bad headaches some of them were pounding.  She has had some nasal congestion and no blowing and she has had some bilateral ear pain as well.  She has not had any ugly nasal discharge or anosmia or associated systemic or constitutional symptoms or fever.  Allergies as of 02/24/2024       Reactions   Dog Epithelium (canis Lupus Familiaris) Other (See Comments)   Other reaction(s): Eye Swelling, Wheezing   Pollen Extract Other (See Comments), Tinitus   Other reaction(s): Headache, Other   Uncaria Tomentosa (cats Claw)    Other reaction(s): eye redness, eye swelling, respiratory distress   Prednisone  Other (See Comments)   Causes patient to gain weight   Cat Dander Other (See Comments)   Eye Redness, Eye Swelling, Respiratory Distress   Strattera [atomoxetine Hcl] Hives         Medication List    aspirin  EC 81 MG tablet Take 1 tablet (81 mg total) by mouth daily. Swallow whole.   BIOTIN PO Take 10,000 mcg by mouth daily.   budesonide  0.5 MG/2ML nebulizer solution Commonly known as: PULMICORT  Perform nasal rinse 2 times daily   CENTRUM SILVER ADULT 50+ PO Take 1 tablet by mouth daily.   cyanocobalamin  1000 MCG tablet Commonly known as: VITAMIN B12 Take 1,000 mcg by mouth daily.   cyproheptadine  4 MG tablet Commonly known as: PERIACTIN  Take one-half to one tablet by mouth every night as directed.   diazepam  5 MG tablet Commonly known as: VALIUM  Place 1 tablet vaginally nightly as needed for muscle spasm/ pelvic pain.   DULCOLAX PO Take by mouth at bedtime.   Flax Oil Take 15 mLs by mouth daily. Flax seed oil/ Mix with Thrive by le-Val shake   levothyroxine  125 MCG tablet Commonly known as: SYNTHROID  Take 125 mcg by mouth daily before breakfast.   Linzess  145 MCG Caps capsule Generic drug: linaclotide  TAKE 1 CAPSULE BY MOUTH ONCE DAILY BEFORE BREAKFAST **PLEASE  CALL  [phone]  TO  SCHEDULE  AN  OFFICE  VISIT  FOR  MORE  REFILLS**   metoprolol  succinate 25 MG 24 hr tablet Commonly known as: TOPROL -XL Take 1 tablet by mouth once daily   NAC 600 PO Take by mouth daily.   nitroGLYCERIN  0.4 MG SL tablet Commonly known as: NITROSTAT  Place 0.4 mg under  the tongue every 5 (five) minutes as needed for chest pain.   NUTRITIONAL SUPPLEMENT PO Take by mouth. Curamed   pantoprazole  40 MG tablet Commonly known as: PROTONIX  Take 1 tablet (40 mg total) by mouth daily. Please call [phone] to schedule an office visit for more refills   polyethylene glycol powder 17 GM/SCOOP powder Commonly known as: GLYCOLAX/MIRALAX Take 17 g by mouth daily. Mix with Thrive   pseudoephedrine 120 MG 12 hr tablet Commonly known as: SUDAFED Take 120 mg by mouth 2 (two) times daily.   Quercetin 500 MG Caps Take 500 mg by mouth daily. Plus    rosuvastatin 10 MG tablet Commonly known as: CRESTOR Take 1 tablet by mouth at bedtime.   Ryaltris  665-25 MCG/ACT Susp Generic drug: Olopatadine-Mometasone  Use two sprays in each nostril twice daily.   STOOL SOFTENER PO Take by mouth at bedtime.   triamcinolone  cream 0.1 % Commonly known as: KENALOG Apply topically.   valACYclovir 1000 MG tablet Commonly known as: VALTREX Take 1,000 mg by mouth daily as needed.   VITAMIN C PO Take by mouth daily.   VITAMIN D -3 PO Take by mouth daily.   Vivelle-Dot 0.1 MG/24HR patch Generic drug: estradiol Place 1 patch onto the skin 2 (two) times a week.   zinc gluconate 50 MG tablet Take 50 mg by mouth at bedtime.   zolpidem  10 MG tablet Commonly known as: AMBIEN  Take 10 mg by mouth at bedtime.    Past Medical History:  Diagnosis Date   Adenomatous colon polyp    Aftercare following right knee joint replacement surgery 09/01/2017   ALLERGIC RHINITIS, SEASONAL 06/26/2006   Qualifier: Diagnosis of  By: Jasmine Hahn, Jasmine Hahn.    Allergy    Angina pectoris 07/03/2021   Arthritis    Asthma    Atypical chest pain 04/27/2018   CAD (coronary artery disease) 08/05/2021   Cervical cancer (HCC)    Chronic constipation 07/13/2019   COVID-19 02/25/2019   Discoid lupus    Diverticulosis    Dyspnea on exertion 07/03/2021   FATIGUE 08/23/2007   Qualifier: Diagnosis of  By: Jasmine Hahn     Former tobacco use 07/29/2017   Frontal headache 01/31/2021   GERD 06/26/2006   Qualifier: Diagnosis of  By: Jasmine Hahn, Jasmine Hahn.    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    Syncope    not sure why   Thyroid  disease    hypo   URI 08/23/2007   Qualifier: Diagnosis of   By: Jasmine Hahn      Replacing diagnoses that were inactivated after the 04/21/22 regulatory import     Wears partial dentures     Past Surgical History:  Procedure Laterality Date   ABDOMINAL HYSTERECTOMY     bone spurs  feet-both feet   CHOLECYSTECTOMY     COLONOSCOPY  08/11/2019   Dr Luis. No neoplasia. Diverticulosis of colon   COLONOSCOPY  06/02/2014   Colon polyp status post polypectomy. Internal hemorrhoids   ELBOW SURGERY     Dr Dorean   ESOPHAGOGASTRODUODENOSCOPY  02/2011   Medoff, NEG   ESOPHAGOGASTRODUODENOSCOPY  08/11/2019   Dr Luis. Normal EGD examination   ESOPHAGOGASTRODUODENOSCOPY  06/02/2014   Mid gastritis. Otherwise normal EGD   KNEE ARTHROSCOPY WITH DRILLING/MICROFRACTURE Right 05/25/2014   Procedure: KNEE ARTHROSCOPY WITH MICROFRACTURE PATELLA FEMORAL CONDYLE;  Surgeon: Toribio Chancy, Hahn;  Location: [facility];  Service: Orthopedics;  Laterality: Right;   KNEE ARTHROSCOPY WITH MEDIAL MENISECTOMY Right 05/25/2014   Procedure: RIGHT KNEE ARTHROSCOPY WITH PARTIAL MEDIAL;  Surgeon: Toribio Chancy, Hahn;  Location: [facility];  Service: Orthopedics;  Laterality: Right;   LEFT HEART CATH AND CORONARY ANGIOGRAPHY N/A 07/05/2021   Procedure: LEFT HEART CATH AND CORONARY ANGIOGRAPHY;  Surgeon: Burnard Debby LABOR, Hahn;  Location: [facility];  Service: Cardiovascular;  Laterality: N/A;   LYSIS OF ADHESION Right 05/25/2014   Procedure: LYSIS OF ADHESION;  Surgeon: Toribio Chancy, Hahn;  Location: [facility];  Service: Orthopedics;  Laterality: Right;   MOHS  SURGERY     OOPHORECTOMY     PARTIAL KNEE ARTHROPLASTY Right 04/17/2016   Procedure: RIGHT KNEE UNICOMPARTMENTAL ARTHROPLASTY;  Surgeon: Toribio JULIANNA Chancy, Hahn;  Location: [facility];  Service: Orthopedics;  Laterality: Right;   RECTOCELE REPAIR  11/2022   REPLACEMENT TOTAL KNEE Right    Skin removed on L leg     THYROIDECTOMY     x 2 surgeries - partial first time   TUBAL LIGATION     VAGINAL PROLAPSE REPAIR     A&P repair, sacrospinous fixation, kelly plication    Review of systems negative except as noted in HPI / PMHx or noted below:  Review of Systems  Constitutional: Negative.   HENT: Negative.    Eyes: Negative.   Respiratory: Negative.    Cardiovascular: Negative.   Gastrointestinal: Negative.   Genitourinary: Negative.   Musculoskeletal: Negative.   Skin: Negative.   Neurological: Negative.   Endo/Heme/Allergies: Negative.   Psychiatric/Behavioral: Negative.       Objective:   Vitals:   02/24/24 1424  BP: 126/82  Pulse: 84  Resp: 20  Temp: 98.2 F (36.8 C)  SpO2: 98%          Physical Exam Constitutional:      Appearance: She is not diaphoretic.  HENT:     Head: Normocephalic.     Right Ear: Tympanic membrane, ear canal and external ear normal.     Left Ear: Tympanic membrane, ear canal and external ear normal.     Nose: Nose normal. No mucosal edema or rhinorrhea.     Mouth/Throat:     Pharynx: Uvula midline. No oropharyngeal exudate.  Eyes:     Conjunctiva/sclera: Conjunctivae normal.  Neck:     Thyroid : No thyromegaly.     Trachea: Trachea normal. No tracheal tenderness or tracheal deviation.  Cardiovascular:     Rate and Rhythm: Normal rate and regular rhythm.     Heart sounds: Normal heart sounds, S1 normal and S2 normal. No murmur heard. Pulmonary:     Effort: No respiratory distress.     Breath sounds: Normal breath sounds. No stridor. No wheezing or rales.  Lymphadenopathy:     Head:  Right side of head: No tonsillar  adenopathy.     Left side of head: No tonsillar adenopathy.     Cervical: No cervical adenopathy.  Skin:    Findings: No erythema or rash.     Nails: There is no clubbing.  Neurological:     Mental Status: She is alert.     Diagnostics: none  Assessment and Plan:   1. Facial pain syndrome   2. Migraine syndrome   3. Dysfunction of sleep stage or arousal   4. Perennial allergic rhinitis   5. Viral respiratory infection    1. Treat and prevent upper airway inflammation:   A. Ryaltris  - 1-2 sprays each nostril 1-2 times per day  2. Treat and prevent headache:   A. Always minimize caffeine and chocolate consumption  3. Treat and prevent sleep dysfunction:   A. Continue Periactin  4 mg - 1/2 - 1 tablet at bedtime  B. Always minimize caffeine and chocolate consumption  4. If needed:   A. 12 hour sudafed sinus 1=2 times per day  B. Nasal saline  5. For this recent event:   A. Use nasal saline a few times per day  B. Prednisone  10 mg - 1 tablet 1 time per day for 3-5 days  6. Return to clinic in 1 year or earlier if problem  7. Influenza = Tamiflu. Covid = paxlovid  [place] appears to have a viral respiratory tract infection that is giving rise to her most recent respiratory tract symptoms and I am going to give her a low-dose of steroids for just a few days to help with the inflammatory component of this issue.  She will continue to address and prevent her facial pain syndrome/headache and sleep dysfunction by using Periactin  at 2 mg before bedtime and continue to address baseline inflammation of her airway with her combination nasal steroid/antihistamine spray.  Assuming she does well with this plan I will see her back in this clinic in 1 year or earlier if there is a problem.  Camellia Denis, Hahn [facility] "

## 2024-02-25 ENCOUNTER — Encounter: Payer: Self-pay | Admitting: Allergy and Immunology

## 2024-03-23 ENCOUNTER — Ambulatory Visit: Admitting: Obstetrics and Gynecology
